# Patient Record
Sex: Female | Born: 1995 | Race: White | Hispanic: No | Marital: Single | Smoking: Former smoker
Health system: Southern US, Community
[De-identification: ages and names within clinical notes are randomized; demographics above are authoritative.]

## PROBLEM LIST (undated history)

## (undated) DIAGNOSIS — E1143 Type 2 diabetes mellitus with diabetic autonomic (poly)neuropathy: Secondary | ICD-10-CM

## (undated) DIAGNOSIS — K219 Gastro-esophageal reflux disease without esophagitis: Secondary | ICD-10-CM

## (undated) DIAGNOSIS — F419 Anxiety disorder, unspecified: Secondary | ICD-10-CM

## (undated) DIAGNOSIS — A1801 Tuberculosis of spine: Secondary | ICD-10-CM

## (undated) DIAGNOSIS — G90A Postural orthostatic tachycardia syndrome (POTS): Secondary | ICD-10-CM

## (undated) DIAGNOSIS — E109 Type 1 diabetes mellitus without complications: Secondary | ICD-10-CM

## (undated) DIAGNOSIS — K3184 Gastroparesis: Secondary | ICD-10-CM

## (undated) DIAGNOSIS — F32A Depression, unspecified: Secondary | ICD-10-CM

## (undated) DIAGNOSIS — F329 Major depressive disorder, single episode, unspecified: Secondary | ICD-10-CM

## (undated) HISTORY — DX: Gastroparesis: K31.84

## (undated) HISTORY — DX: Type 1 diabetes mellitus without complications: E10.9

## (undated) HISTORY — DX: Depression, unspecified: F32.A

## (undated) HISTORY — DX: Postural orthostatic tachycardia syndrome (POTS): G90.A

## (undated) HISTORY — DX: Gastroparesis: E11.43

## (undated) HISTORY — PX: NO PAST SURGERIES: SHX2092

## (undated) HISTORY — DX: Major depressive disorder, single episode, unspecified: F32.9

## (undated) HISTORY — DX: Anxiety disorder, unspecified: F41.9

---
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
## 2016-01-28 DIAGNOSIS — E1065 Type 1 diabetes mellitus with hyperglycemia: Secondary | ICD-10-CM | POA: Insufficient documentation

## 2018-02-27 DIAGNOSIS — E1065 Type 1 diabetes mellitus with hyperglycemia: Secondary | ICD-10-CM | POA: Diagnosis not present

## 2018-02-27 DIAGNOSIS — R112 Nausea with vomiting, unspecified: Secondary | ICD-10-CM | POA: Diagnosis not present

## 2018-04-28 ENCOUNTER — Encounter: Payer: Self-pay | Admitting: Physician Assistant

## 2018-04-28 ENCOUNTER — Ambulatory Visit (INDEPENDENT_AMBULATORY_CARE_PROVIDER_SITE_OTHER): Payer: 59 | Admitting: Physician Assistant

## 2018-04-28 VITALS — BP 114/80 | HR 75 | Ht 66.0 in | Wt 149.0 lb

## 2018-04-28 DIAGNOSIS — K3184 Gastroparesis: Secondary | ICD-10-CM

## 2018-04-28 DIAGNOSIS — E1143 Type 2 diabetes mellitus with diabetic autonomic (poly)neuropathy: Secondary | ICD-10-CM | POA: Insufficient documentation

## 2018-04-28 DIAGNOSIS — F17210 Nicotine dependence, cigarettes, uncomplicated: Secondary | ICD-10-CM | POA: Diagnosis not present

## 2018-04-28 DIAGNOSIS — E1065 Type 1 diabetes mellitus with hyperglycemia: Secondary | ICD-10-CM

## 2018-04-28 DIAGNOSIS — Z7689 Persons encountering health services in other specified circumstances: Secondary | ICD-10-CM

## 2018-04-28 DIAGNOSIS — F39 Unspecified mood [affective] disorder: Secondary | ICD-10-CM

## 2018-04-28 MED ORDER — FLUOXETINE HCL 10 MG PO TABS: 10.0000 mg | ORAL_TABLET | Freq: Every day | ORAL | 0 refills | Status: DC

## 2018-04-28 MED ORDER — FREESTYLE LIBRE SENSOR SYSTEM MISC: 0 refills | Status: DC

## 2018-04-28 MED ORDER — DULOXETINE HCL 20 MG PO CPEP: ORAL_CAPSULE | ORAL | 1 refills | Status: DC

## 2018-04-28 MED ORDER — GLUCOSE BLOOD VI STRP: ORAL_STRIP | 3 refills | Status: DC

## 2018-04-28 MED ORDER — INSULIN GLARGINE 100 UNIT/ML ~~LOC~~ SOLN: 40.0000 [IU] | Freq: Every day | SUBCUTANEOUS | 2 refills | Status: DC

## 2018-04-28 MED ORDER — INSULIN GLARGINE 100 UNIT/ML SOLOSTAR PEN: PEN_INJECTOR | SUBCUTANEOUS | 1 refills | Status: DC

## 2018-04-28 MED ORDER — FREESTYLE LIBRE SENSOR SYSTEM MISC
0 refills | Status: DC
Start: 2018-04-28 — End: 2018-05-19

## 2018-04-28 NOTE — Progress Notes (Signed)
HPI:                                                                Veronica Liu is a 22 y.o. female who presents to [facility]: Primary Care Sports Medicine today to establish care  Current concerns: medication refills  Depression/Anxiety: has been taking Fluoxetine 20 mg daily for approximately 3 months. Does not feel it is helping her mood or anxiety. Endorses mood swings, anhedonia, depressed mood, excessive guilt, sleep difficulties, excessive worry, difficulty relaxing most days. Endorses passive SI, no plan or self-harming behaviors. Hx of self-harming 4 years ago. Also reports some hypomanic behaviors and psychiatry is questioning a possible bipolar II diagnosis. Denies suicidal thinking. Denies auditory/visual hallucinations. No history of psychiatric hospitalizations No drug or alcohol misuse  Type 1 Diabetes: last A1C 9.4, 2 months ago. Currently on Lantus 40 units daily and Humalog sliding scale. Reports history of gastroparesis, currently controlled.   Depression screen PHQ 2/9 04/28/2018  Decreased Interest 2  Down, Depressed, Hopeless 2  PHQ - 2 Score 4  Altered sleeping 3  Tired, decreased energy 3  Change in appetite 3  Feeling bad or failure about yourself  3  Trouble concentrating 2  Moving slowly or fidgety/restless 1  Suicidal thoughts 1  PHQ-9 Score 20    GAD 7 : Generalized Anxiety Score 04/28/2018  Nervous, Anxious, on Edge 2  Control/stop worrying 2  Worry too much - different things 3  Trouble relaxing 1  Restless 2  Easily annoyed or irritable 1  Afraid - awful might happen 1  Total GAD 7 Score 12  Anxiety Difficulty Very difficult      Past Medical History:  Diagnosis Date  . Anxiety   . Depression   . Diabetes mellitus type I (HCC)   . Diabetic gastroparesis (HCC)   . GERD (gastroesophageal reflux disease)    Past Surgical History:  Procedure Laterality Date  . NO PAST SURGERIES     Social History   Tobacco  Use  . Smoking status: Current Every Day Smoker    Packs/day: 0.50    Years: 5.00    Pack years: 2.50    Types: Cigarettes  . Smokeless tobacco: Never Used  . Tobacco comment: NA  Substance Use Topics  . Alcohol use: Never    Frequency: Never   family history includes Cancer in her mother; Diabetes in her cousin, father, and paternal grandmother; Hypothyroidism in her mother and paternal grandmother.    ROS: Review of Systems  Constitutional: Positive for diaphoresis and malaise/fatigue.  Gastrointestinal: Positive for constipation and nausea.  Musculoskeletal: Positive for myalgias.  Neurological: Positive for headaches.  Psychiatric/Behavioral: Positive for depression. The patient is nervous/anxious and has insomnia.      Medications: Current Outpatient Medications  Medication Sig Dispense Refill  . acetone, urine, test strip Check ketones per protocol 50 each 3  . BAYER MICROLET LANCETS lancets Check sugars 6 times daily and per protocol for hyper and hypoglycemia 250 each 3  . glucagon (GLUCAGON EMERGENCY) 1 MG injection Inject 1 mg into the muscle once as needed (for hypoglycemia). 2 each 3  . glucose blood (ONETOUCH VERIO) test strip Checks blood sugar 6x daily 600 each 3  . Insulin Glargine (LANTUS  SOLOSTAR) 100 UNIT/ML Solostar Pen Use up to 70 units daily 10 pen 1  . Insulin Pen Needle (INSUPEN PEN NEEDLES) 32G X 4 MM MISC Inject insulin via insulin pen 6 x daily 600 each 4  . promethazine (PHENERGAN) 25 MG tablet Take 25 mg by mouth as needed for nausea or vomiting.    . ranitidine (ZANTAC) 150 MG tablet Take 1 tablet (150 mg total) by mouth 2 (two) times daily as needed for heartburn (for acid reflux). 180 tablet 3  . Continuous Blood Gluc Sensor (FREESTYLE LIBRE SENSOR SYSTEM) MISC Check morning FBG and before meals 1 each 0  . DULoxetine (CYMBALTA) 20 MG capsule Take 1 capsule (20 mg total) by mouth at bedtime for 7 days, THEN 2 capsules (40 mg total) at bedtime for  23 days. 30 capsule 1  . FLUoxetine (PROZAC) 10 MG tablet Take 1 tablet (10 mg total) by mouth daily. 10 tablet 0   No current facility-administered medications for this visit.    Allergies  Allergen Reactions  . Levemir [Insulin Detemir]     Hives at injection sites  . Augmentin [Amoxicillin-Pot Clavulanate]     Vomiting as young child  . Omnicef [Cefdinir]     Vomiting as young child.       Objective:  BP 114/80   Pulse 75   Ht  (1.676 m)   Wt 149 lb (67.6 kg)   LMP 04/19/2018 (Exact Date)   SpO2 100%   BMI 24.05 kg/m  Gen:  alert, not ill-appearing, no distress, appropriate for age HEENT: head normocephalic without obvious abnormality, conjunctiva and cornea clear, wearing glasses, trachea midline Pulm: Normal work of breathing, normal phonation, clear to auscultation bilaterally, no wheezes, rales or rhonchi CV: Normal rate, regular rhythm, s1 and s2 distinct, mid-systolic click, no murmurs Neuro: alert and oriented x 3, no tremor MSK: extremities atraumatic, normal gait and station Skin: intact, no rashes on exposed skin, no jaundice, no cyanosis Psych: well-groomed, cooperative, good eye contact, depressed mood, affect full range, speech is articulate, and thought processes clear and goal-directed    No results found for this or any previous visit (from the past 72 hour(s)). No results found.    Assessment and Plan: 22 y.o. female with   Encounter to establish care  Uncontrolled type 1 diabetes mellitus with hyperglycemia (HCC) - Plan: Ambulatory referral to Endocrinology, Continuous Blood Gluc Sensor (FREESTYLE LIBRE SENSOR SYSTEM) MISC, Insulin Glargine (LANTUS SOLOSTAR) 100 UNIT/ML Solostar Pen, glucose blood (ONETOUCH VERIO) test strip, DISCONTINUED: insulin glargine (LANTUS) 100 UNIT/ML injection, DISCONTINUED: glucose blood (ONETOUCH VERIO) test strip, DISCONTINUED: Continuous Blood Gluc Sensor (FREESTYLE LIBRE SENSOR SYSTEM) MISC  Type I diabetes  mellitus, uncontrolled (HCC)  Mood disorder (HCC) - Plan: DULoxetine (CYMBALTA) 20 MG capsule, FLUoxetine (PROZAC) 10 MG tablet, DISCONTINUED: DULoxetine (CYMBALTA) 20 MG capsule, DISCONTINUED: FLUoxetine (PROZAC) 10 MG tablet  Cigarette nicotine dependence without complication  - Personally reviewed PMH, PSH, PFH, medications, allergies, HM - Age-appropriate cancer screening: Pap smear beginning age 72 - Influenza n/a - Tdap overdue  Type 1 diabetes - last A1C 9.2 - explained I do not manage type 1 diabetes and will refer her to endocrinology for management - refills provided until she can establish  Mood disorder - PHQ9=20, no acute safety issues; GAD7=12 - no active mania - not responding to Fluoxetine after 3 month trial - tapering off of Fluoxetine and titrating up on Duloxetine - referring to behavioral health for CBT    Patient education  and anticipatory guidance given Patient agrees with treatment plan Follow-up in 4 weeks for mood or sooner as needed if symptoms worsen or fail to improve  Levonne Hubert PA-C

## 2018-05-19 ENCOUNTER — Ambulatory Visit (INDEPENDENT_AMBULATORY_CARE_PROVIDER_SITE_OTHER): Payer: 59 | Admitting: Internal Medicine

## 2018-05-19 ENCOUNTER — Encounter: Payer: Self-pay | Admitting: Internal Medicine

## 2018-05-19 VITALS — BP 118/80 | HR 87 | Ht 66.0 in | Wt 151.0 lb

## 2018-05-19 DIAGNOSIS — E1042 Type 1 diabetes mellitus with diabetic polyneuropathy: Secondary | ICD-10-CM | POA: Diagnosis not present

## 2018-05-19 DIAGNOSIS — E1065 Type 1 diabetes mellitus with hyperglycemia: Secondary | ICD-10-CM | POA: Diagnosis not present

## 2018-05-19 DIAGNOSIS — IMO0002 Reserved for concepts with insufficient information to code with codable children: Secondary | ICD-10-CM

## 2018-05-19 LAB — POCT GLYCOSYLATED HEMOGLOBIN (HGB A1C): Hemoglobin A1C: 9.2 % — AB (ref 4.0–5.6)

## 2018-05-19 MED ORDER — INSULIN DEGLUDEC 200 UNIT/ML ~~LOC~~ SOPN: 36.0000 [IU] | PEN_INJECTOR | Freq: Every day | SUBCUTANEOUS | 3 refills | Status: DC

## 2018-05-19 MED ORDER — INSULIN GLARGINE 100 UNIT/ML SOLOSTAR PEN: PEN_INJECTOR | SUBCUTANEOUS | 1 refills | Status: DC

## 2018-05-19 NOTE — Addendum Note (Signed)
Addended by: Yolande JollyLAWSON, Yehudis Monceaux on: 05/19/2018 01:07 PM   Modules accepted: Orders

## 2018-05-19 NOTE — Patient Instructions (Addendum)
Please decrease: - Long acting insulin to 36 units and switch to [place]  Please change - Humalog: ICR 1:6 >> 5 Target 100 ISF 25  Please try to eat breakfast.  Look into Dexcom 6 CGM.  Please return in 3 months with your sugar log.   Basic Rules for Patients with Type I Diabetes Mellitus  1. The American Diabetes Association (ADA) recommended targets: - fasting sugar <130 - after meal sugar <180 - HbA1C <7%  2. Engage in ?150 min moderate exercise per week  3. Make sure you have ?8h of sleep every night as this helps both blood sugars and your weight.  4. Always keep a sugar log (not only record in your meter) and bring it to all appointments with [place].  5. If you are on a pump, know how to access the settings and to modify the parameters.  6.  Remember, you can always call the number on the back of the pump for emergencies related to the pump.  7. "15-15 rule" for hypoglycemia: if sugars are low, take 15 g of carbs** ("fast sugar" - e.g. 4 glucose tablets, 4 oz orange juice), wait 15 min, then check sugars again. If still <80, repeat. Continue  until your sugars >80, then eat a normal meal.   8. Teach family members and coworkers to inject glucagon. Have a glucagon set at home and one at work. They should call 911 after using the set.  9. If you are on a pump, set "insulin on board" time for 5 hours (if your sugars tend to be higher, can use 4 hours).   10. If you are on a pump, use the "dual wave bolus" setting for high fat foods (e.g. pizza). Start with a setting of 50%-50% (50% instant bolus and 50% prolonged bolus over 3h, for e.g.).    11. If you are on a pump, make sure the basal daily insulin dose is approximately equal (not larger) to the daily insulin you get from boluses, otherwise you are at risk for hypoglycemia.  12. Check sugar before driving. If <100, correct, and only start driving if sugars rise ?161. Check sugar every hour when on a long drive.  13. Check  sugar before exercising. If <100, correct, and only start exercising if sugars rise ?100. Check sugar every hour when on a long exercise routine and 1h after you finished exercising.   If >250, check urine for ketones. If you have moderate-large ketones in urine, do not start exercise. Hydrate yourself with clear liquids and correct the high sugar. Recheck sugars and ketones before attempting to exercise.  Be aware that you might need less insulin when exercising.  *intense, short, exercise bursts can increase your sugars, but  *less intense, longer (>1h), exercise routines can decrease your sugars.  If you are on a pump, you might need to decrease your basal rate by 10% or more (or even disconnect your pump) while you exercise to prevent low sugars. Do not disconnect your pump by more than 3 hours at a time! You also might need to decrease your insulin bolus for the meal prior to your exercise time by 20% or more.  14. Make sure you have a MedAlert bracelet or pendant mentioning "Type I Diabetes Mellitus". If you have a prior episode of severe hypoglycemia or hypoglycemia unawareness, it should also mention this.  15. Please do not walk barefoot. Inspect your feet for sores/cuts and let us know if you have them.  16.  Please call [facility] with any questions and concerns [phone]).   **E.g. of "fast carbs": ? first choice (15 g):  1 tube glucose gel, GlucoPouch 15, 2 oz glucose liquid ? second choice (15-16 g):  3 or 4 glucose tablets (best taken  with water), 15 Dextrose Bits chewable ? third choice (15-20 g):   cup fruit juice,  cup regular soda, 1 cup skim milk,  1 cup sports drink ? fourth choice (15-20 g):  1 small tube [place] gel (not frosting), 2 tbsp raisins, 1 tbsp table sugar,  candy, jelly beans, gum drops - check package for carb amount   (adapted from: Juluis RainierMcCall A.L. "Insulin therapy and hypoglycemia" Endocrinol Metab Clin N Am 2012, 41:  57-87)

## 2018-05-19 NOTE — Progress Notes (Signed)
Patient ID: Veronica Liu, female   DOB: 11-Aug-1996, 22 y.o.   MRN: [id number]  HPI: Veronica Liu is a 22 y.o.-year-old female, referred by her PCP, Trixie Dredge, PA-C, for management of DM1, uncontrolled, with long-term complications (PN, gastoparesis).  She was previously followed by Dr. Tobe Sos and then Annia Belt with pediatric endocrinology at [facility].  She is here with her mother who is a Marine scientist and who offers information about patient's past medical history, diabetes management, and diet.  Patient has been diagnosed with DM2 at 22 y/o, then DM1 at 22 y/o.  She was started on insulin after being diagnosed with DM1. She has been advised  against an insulin pump in the past until she can get better control of her diabetes she noticed that in the last 1.5 years her insulin resistance has decreased.  She controls her diabetes a little better.  She tried to obtain the freestyle libre CGM but this was too expensive.  Last hemoglobin A1c was: 02/27/2018: HbA1c 9.4% 11/11/2017: HbA1c 8.8% 08/16/2017: HbA1c 8.9% 04/18/2017: HbA1c 9.3% 06/23/2016: HbA1c 12% Lab Results  Component Value Date   HGBA1C 8.7 08/05/2014   HGBA1C 9.9 03/05/2014   HGBA1C 8.0 09/06/2013   She is on the following regimen: -Lantus 40 units at bedtime - does not miss doses -Humalog: ICR 1:6 Target 100 ISF 25  Meter: One Touch Verio  Pt checks her sugars 4x a day and they are: - am: 120-140, 200 - 2h after b'fast: n/c - before lunch: 170 (8 units) - 2h after lunch: n/c - before dinner: 200-250 (10-12 units) - 2h after dinner: n/c - bedtime: 300s - nighttime: 42 + lows. Lowest sugar was 42 last week (during the night); she has hypoglycemia awareness at 110. No previous hypoglycemia admission. She does have a glucagon kit at home. Highest sugar was 475-HI. + previous DKA admissions - last 3 years ago.    Pt's meals are (she is very sensitive to discussions about her diet): - Breakfast:  skips b/c gastroparesis (nausea) - prev. Reglan, now phenergan - Lunch: sandwich + chips - Dinner: meat + veggies + bread - Snacks: cheese sticks, carrots  She exercises by cardio 3 times a week.  Reviewed previous labs - no CKD, + HL:  Previously: Lab Results  Component Value Date   BUN 14 03/05/2014   BUN 8 06/06/2013   CREATININE 0.55 03/05/2014   CREATININE 0.43 (L) 06/06/2013   Lab Results  Component Value Date   CHOL 203 (H) 03/05/2014   HDL 47 03/05/2014   LDLCALC 133 (H) 03/05/2014   TRIG 116 03/05/2014   CHOLHDL 4.3 03/05/2014   - last eye exam was in 2018. No DR.  - + occas. numbness and tingling in her feet.  Last TSH was normal: Lab Results  Component Value Date   TSH 1.053 03/05/2014   Pt has FH of DM2 in father and PGM.  Cousin with DM1.  She has a history of depression. She smokes half a pack a day.   As of now, she  finished school and not yet working.  ROS: Constitutional: no weight gain/loss, + fatigue, no subjective hyperthermia/hypothermia, + poor sleep Eyes: no blurry vision, no xerophthalmia ENT: no sore throat, no nodules palpated in throat, no dysphagia/odynophagia, no hoarseness Cardiovascular: no CP/SOB/palpitations/leg swelling Respiratory: no cough/SOB Gastrointestinal: + N/no V/D/C Musculoskeletal: no muscle/joint aches Skin: no rashes Neurological: no tremors/numbness/tingling/dizziness Psychiatric: + Both depression/anxiety  Past Medical History:  Diagnosis Date  .  Anxiety   . Depression   . Diabetes mellitus type I ([place])   . Diabetic gastroparesis ([place])   . GERD (gastroesophageal reflux disease)    Past Surgical History:  Procedure Laterality Date  . NO PAST SURGERIES     Social History   Socioeconomic History  . Marital status: Single    Spouse name: Not on file  . Number of children: 0  Occupational History  . N/a  Tobacco Use  . Smoking status: Current Every Day Smoker    Packs/day: 0.50    Years: 5.00     Pack years: 2.50    Types: Cigarettes  . Smokeless tobacco: Never Used  . Tobacco comment: NA  Substance and Sexual Activity  . Alcohol use: Never    Frequency: Never  . Drug use: Never  . Sexual activity: Yes    Birth control/protection: None, Condom  Social History Narrative   Lives with mom, step-dad, brother, and sister. Sees bio-dad twice monthly.    Current Outpatient Medications on File Prior to Visit  Medication Sig Dispense Refill  . acetone, urine, test strip Check ketones per protocol 50 each 3  . BAYER MICROLET LANCETS lancets Check sugars 6 times daily and per protocol for hyper and hypoglycemia 250 each 3  . Continuous Blood Gluc Sensor (FREESTYLE LIBRE SENSOR SYSTEM) MISC Check morning FBG and before meals 1 each 0  . DULoxetine (CYMBALTA) 20 MG capsule Take 1 capsule (20 mg total) by mouth at bedtime for 7 days, THEN 2 capsules (40 mg total) at bedtime for 23 days. 30 capsule 1  . FLUoxetine (PROZAC) 10 MG tablet Take 1 tablet (10 mg total) by mouth daily. 10 tablet 0  . glucagon (GLUCAGON EMERGENCY) 1 MG injection Inject 1 mg into the muscle once as needed (for hypoglycemia). 2 each 3  . glucose blood (ONETOUCH VERIO) test strip Checks blood sugar 6x daily 600 each 3  . Insulin Glargine (LANTUS SOLOSTAR) 100 UNIT/ML Solostar Pen Use up to 70 units daily 10 pen 1  . Insulin Lispro Prot & Lispro (HUMALOG MIX 75/25 PEN [place]) Inject into the skin. QID, sliding scale    . Insulin Pen Needle (INSUPEN PEN NEEDLES) 32G X 4 MM MISC Inject insulin via insulin pen 6 x daily 600 each 4  . promethazine (PHENERGAN) 25 MG tablet Take 25 mg by mouth as needed for nausea or vomiting.    . ranitidine (ZANTAC) 150 MG tablet Take 1 tablet (150 mg total) by mouth 2 (two) times daily as needed for heartburn (for acid reflux). 180 tablet 3   No current facility-administered medications on file prior to visit.    Allergies  Allergen Reactions  . Levemir [Insulin Detemir]     Hives at  injection sites  . Augmentin [Amoxicillin-Pot Clavulanate]     Vomiting as young child  . Omnicef [Cefdinir]     Vomiting as young child.   Family History  Problem Relation Age of Onset  . Hypothyroidism Mother   . Cancer Mother        appendix cancer  . Diabetes Father        type 2  . Hypothyroidism Paternal Grandmother   . Diabetes Paternal Grandmother        type 2  . Diabetes Cousin        type 1    PE: BP 118/80   Pulse 87   Ht _0  (1.676 m)   Wt 151 lb (68.5 kg)  LMP 04/19/2018 (Exact Date)   SpO2 98%   BMI 24.37 kg/m  Wt Readings from Last 3 Encounters:  05/19/18 151 lb (68.5 kg)  04/28/18 149 lb (67.6 kg)  08/05/14 169 lb (76.7 kg) (94 %, Z= 1.53)*   * Growth percentiles are based on CDC (Girls, 2-20 Years) data.   Constitutional: Normal weight, in NAD Eyes: PERRLA, EOMI, no exophthalmos ENT: moist mucous membranes, no thyromegaly, no cervical lymphadenopathy Cardiovascular: RRR, No MRG Respiratory: CTA B Gastrointestinal: abdomen soft, NT, ND, BS+ Musculoskeletal: no deformities, strength intact in all 4 Skin: moist, warm, no rashes Neurological: no tremor with outstretched hands, DTR normal in all 4  ASSESSMENT: 1. DM1, uncontrolled, without long-term complications, but with hyperglycemia  PLAN:  1. Patient with long-standing, uncontrolled DM1, on basal-bolus insulin regimen.  She noticed an improvement in her diabetes control in the last 1.5 years, and she also lost a significant amount of weight since then.  Her sugars are still not very well controlled, today, HbA1c being 9.2%.  This is slightly better compared to before, but above goal.   - Her sugars increase in a stepwise fashion throughout the day, usually a sign of not enough mealtime insulin.  At night, before going to bed, her sugars are in the 300s and they decrease by more than 50% from night to morning.  We discussed that this is an excessive drop and it is not physiologic.  I would  suggest to decrease the dose of her basal insulin and increase the dose of her rapid acting insulin for a more physiologic regimen of approximately 50-50% of insulin from basal and from boluses per day. - I also suggested to switch her basal insulin from Lantus to [place], which is longer acting and less conducive to low blood sugars and fluctuating CBGs.  We will also try to use [place] U200 which allow for smaller injection volumes, conducive to less scar tissue and better absorption. - I also suggested to decrease her insulin to carb ratio from 1:6 to 1:5 and, occasionally, to introduce 50% of her protein grams as carbs for better coverage of her meals - She will also try to start eating breakfast, at least half a glass of protein shake.  We discussed how to cover this. - we also discussed about an insulin pump and I advised her to stay open to this possibility. - As she is interested in a, CGM, I suggested a Dexcom 6 CGM rather than the Freestyle libre CGM, since the first has alarms and, if she decides for an insulin pump, this can be connected to a T:slim pump.  Given Dexcom G6 brochure and advised her how to get in touch with the rep. - We discussed about changes to his insulin regimen, as follows:  Patient Instructions  Please decrease: - Long acting insulin to 36 units and switch to [place]  Please change - Humalog: ICR 1:6 >> 5 Target 100 ISF 25   Please try to eat breakfast.  Look into Dexcom 6 CGM.  Please return in 3 months with your sugar log.   - Continuechecking sugars at different times of the day - check at least 4 times a day, rotating checks - given sugar log and advised how to fill it and to bring it at next appt  - given foot care handout and explained the principles  - given instructions for hypoglycemia management "15-15 rule"  - advised for yearly eye exams - sent glucagon kit Rx to pharmacy -  advised to get ketone strips - advised to always have Glu tablets with  her - advised for a Med-alert bracelet mentioning "type 1 diabetes mellitus". - given instruction Re: exercising and driving in DM1 (pt instructions) - no signs of other autoimmune disorders, will need a TSH at next visit - Return to clinic in 3 mo with sugar log   Philemon Kingdom, MD PhD [facility]

## 2018-05-30 ENCOUNTER — Ambulatory Visit: Payer: 59 | Admitting: Physician Assistant

## 2018-06-19 ENCOUNTER — Other Ambulatory Visit: Payer: Self-pay

## 2018-06-19 MED ORDER — DEXCOM G6 TRANSMITTER MISC: 1.0000 | Freq: Once | 3 refills | Status: DC

## 2018-06-19 MED ORDER — DEXCOM G6 RECEIVER DEVI: 1.0000 | Freq: Once | 0 refills | Status: DC

## 2018-06-19 MED ORDER — DEXCOM G6 SENSOR MISC: 1.0000 | Freq: Once | 12 refills | Status: DC

## 2018-06-28 ENCOUNTER — Other Ambulatory Visit: Payer: Self-pay | Admitting: Physician Assistant

## 2018-06-28 DIAGNOSIS — F39 Unspecified mood [affective] disorder: Secondary | ICD-10-CM

## 2018-07-18 DIAGNOSIS — Z794 Long term (current) use of insulin: Secondary | ICD-10-CM | POA: Diagnosis not present

## 2018-07-18 DIAGNOSIS — E1042 Type 1 diabetes mellitus with diabetic polyneuropathy: Secondary | ICD-10-CM | POA: Diagnosis not present

## 2018-07-29 ENCOUNTER — Other Ambulatory Visit: Payer: Self-pay | Admitting: Physician Assistant

## 2018-07-29 DIAGNOSIS — F39 Unspecified mood [affective] disorder: Secondary | ICD-10-CM

## 2018-09-15 ENCOUNTER — Ambulatory Visit: Payer: 59 | Admitting: Internal Medicine

## 2018-09-15 DIAGNOSIS — Z0289 Encounter for other administrative examinations: Secondary | ICD-10-CM

## 2018-09-15 NOTE — Progress Notes (Deleted)
Patient ID: Veronica Liu, female   DOB: [birth year], 22 y.o.   MRN: [id number]  HPI: Veronica Liu is a 22 y.o.-year-old femalele, referred by her PCP, Trixie Dredge, PA-C, for management of DM, dx'ed at 22 y/o but as DM1 at 22 y/o, uncontrolled, with long-term complications (PN, gastoparesis).  She was previously followed by Dr. Tobe Sos and then Annia Belt with pediatric endocrinology at [facility].    She has been advised  against an insulin pump in the past until she can get better control of her diabetes.  Since then, she feels that she is doing better with her diabetes control.  She did try to obtain a freestyle libre CGM but this was too expensive.  At last visit, I suggested a Dexcom G6 and I advised her to discuss with her insurance to see if this is covered.  Last hemoglobin A1c was: Lab Results  Component Value Date   HGBA1C 9.2 (A) 05/19/2018   HGBA1C 8.7 08/05/2014   HGBA1C 9.9 03/05/2014  02/27/2018: HbA1c 9.4% 11/11/2017: HbA1c 8.8% 08/16/2017: HbA1c 8.9% 04/18/2017: HbA1c 9.3% 06/23/2016: HbA1c 12%  She is on: -Lantus 40 units at bedtime >> Tresiba 36 units daily, changed 05/2018 -Humalog: ICR 1:6 >> 1.5 Target 100 ISF 25  Meter: One Touch Verio  Pt checks her sugars 4x a day: - am: 120-140, 200 - 2h after b'fast: n/c - before lunch: 170 (8 units) - 2h after lunch: n/c - before dinner: 200-250 (10-12 units) - 2h after dinner: n/c - bedtime: 300s - nighttime: 42 Lowest sugar was 42 (during the night) >> ***; she has hypoglycemia awareness at 110. No previous hypoglycemia admission. She ha a Glucagon kit at home. Highest sugar was 475-HI. + previous DKA admissions - last 3 years ago.    Pt's meals are (she is very sensitive to discussions about her diet): - Breakfast: skips b/c gastroparesis (nausea) - prev. Reglan, now phenergan - Lunch: sandwich + chips - Dinner: meat + veggies + bread - Snacks: cheese sticks, carrots  She exercises (cardio)  3x a week.  Reviewed prev. Labs: No CKD, + HL:  Previously: Lab Results  Component Value Date   BUN 14 03/05/2014   BUN 8 06/06/2013   CREATININE 0.55 03/05/2014   CREATININE 0.43 (L) 06/06/2013   Lab Results  Component Value Date   CHOL 203 (H) 03/05/2014   HDL 47 03/05/2014   LDLCALC 133 (H) 03/05/2014   TRIG 116 03/05/2014   CHOLHDL 4.3 03/05/2014   - last eye exam was in 2018: No DR -  + occas. numbness and tingling in her feet.  Last TSH normal: Lab Results  Component Value Date   TSH 1.053 03/05/2014   Pt has FH of DM2 in father and PGM.  Cousin with DM1.  She has a history of depression. She smokes half a pack a day.   As of now, she  finished school and not yet working.  ROS: Constitutional: no weight gain/no weight loss, no fatigue, no subjective hyperthermia, no subjective hypothermia Eyes: no blurry vision, no xerophthalmia ENT: no sore throat, no nodules palpated in throat, no dysphagia, no odynophagia, no hoarseness Cardiovascular: no CP/no SOB/no palpitations/no leg swelling Respiratory: no cough/no SOB/no wheezing Gastrointestinal: + N/no V/no D/no C/no acid reflux Musculoskeletal: no muscle aches/no joint aches Skin: no rashes, no hair loss Neurological: no tremors/no numbness/no tingling/no dizziness  I reviewed pt's medications, allergies, PMH, social hx, family hx, and changes were documented in the history  of present illness. Otherwise, unchanged from my initial visit note.  Past Medical History:  Diagnosis Date  . Anxiety   . Depression   . Diabetes mellitus type I ([place])   . Diabetic gastroparesis ([place])   . GERD (gastroesophageal reflux disease)    Past Surgical History:  Procedure Laterality Date  . NO PAST SURGERIES     Social History   Socioeconomic History  . Marital status: Single    Spouse name: Not on file  . Number of children: 0  Occupational History  . N/a  Tobacco Use  . Smoking status: Current Every Day Smoker     Packs/day: 0.50    Years: 5.00    Pack years: 2.50    Types: Cigarettes  . Smokeless tobacco: Never Used  . Tobacco comment: NA  Substance and Sexual Activity  . Alcohol use: Never    Frequency: Never  . Drug use: Never  . Sexual activity: Yes    Birth control/protection: None, Condom  Social History Narrative   Lives with mom, step-dad, brother, and sister. Sees bio-dad twice monthly.    Current Outpatient Medications on File Prior to Visit  Medication Sig Dispense Refill  . acetone, urine, test strip Check ketones per protocol 50 each 3  . BAYER MICROLET LANCETS lancets Check sugars 6 times daily and per protocol for hyper and hypoglycemia 250 each 3  . DULoxetine (CYMBALTA) 20 MG capsule Take 2 capsules (40 mg total) by mouth daily. Due for follow up visit 60 capsule 0  . FLUoxetine (PROZAC) 10 MG tablet Take 1 tablet (10 mg total) by mouth daily. 10 tablet 0  . glucagon (GLUCAGON EMERGENCY) 1 MG injection Inject 1 mg into the muscle once as needed (for hypoglycemia). 2 each 3  . glucose blood (ONETOUCH VERIO) test strip Checks blood sugar 6x daily 600 each 3  . Insulin Degludec (TRESIBA FLEXTOUCH) 200 UNIT/ML SOPN Inject 36 Units into the skin daily. 3 pen 3  . Insulin Glargine (LANTUS SOLOSTAR) 100 UNIT/ML Solostar Pen Use up to 40 units daily 10 pen 1  . Insulin Lispro Prot & Lispro (HUMALOG MIX 75/25 PEN ) Inject into the skin. QID, sliding scale    . Insulin Pen Needle (INSUPEN PEN NEEDLES) 32G X 4 MM MISC Inject insulin via insulin pen 6 x daily 600 each 4  . promethazine (PHENERGAN) 25 MG tablet Take 25 mg by mouth as needed for nausea or vomiting.    . ranitidine (ZANTAC) 150 MG tablet Take 1 tablet (150 mg total) by mouth 2 (two) times daily as needed for heartburn (for acid reflux). 180 tablet 3   No current facility-administered medications on file prior to visit.    Allergies  Allergen Reactions  . Levemir [Insulin Detemir]     Hives at injection sites  .  Augmentin [Amoxicillin-Pot Clavulanate]     Vomiting as young child  . Omnicef [Cefdinir]     Vomiting as young child.   Family History  Problem Relation Age of Onset  . Hypothyroidism Mother   . Cancer Mother        appendix cancer  . Diabetes Father        type 2  . Hypothyroidism Paternal Grandmother   . Diabetes Paternal Grandmother        type 2  . Diabetes Cousin        type 1   PE: There were no vitals taken for this visit. Wt Readings from Last 3 Encounters:  05/19/18 151 lb (68.5 kg)  04/28/18 149 lb (67.6 kg)  08/05/14 169 lb (76.7 kg) (94 %, Z= 1.53)*   * Growth percentiles are based on CDC (Girls, 2-20 Years) data.   Constitutional: normal weight, in NAD Eyes: PERRLA, EOMI, no exophthalmos ENT: moist mucous membranes, no thyromegaly, no cervical lymphadenopathy Cardiovascular: RRR, No MRG Respiratory: CTA B Gastrointestinal: abdomen soft, NT, ND, BS+ Musculoskeletal: no deformities, strength intact in all 4 Skin: moist, warm, no rashes Neurological: no tremor with outstretched hands, DTR normal in all 4  ASSESSMENT: 1. DM1, uncontrolled, without long-term complications, but with hyperglycemia  PLAN:  1. Patient with   long-standing, uncontrolled DM1, on basal-bolus insulin regimen.  She noticed an improvement in her diabetes control in the last 1.5 years, and she also lost a significant amount of weight since then.  Her sugars are still not very well controlled, today, HbA1c being 9.2%.  This is slightly better compared to before, but above goal.   - Her sugars increase in a stepwise fashion throughout the day, usually a sign of not enough mealtime insulin.  At night, before going to bed, her sugars are in the 300s and they decrease by more than 50% from night to morning.  We discussed that this is an excessive drop and it is not physiologic.  I would suggest to decrease the dose of her basal insulin and increase the dose of her rapid acting insulin for a more  physiologic regimen of approximately 50-50% of insulin from basal and from boluses per day. - I also suggested to switch her basal insulin from Lantus to [place], which is longer acting and less conducive to low blood sugars and fluctuating CBGs.  We will also try to use [place] U200 which allow for smaller injection volumes, conducive to less scar tissue and better absorption. - I also suggested to decrease her insulin to carb ratio from 1:6 to 1:5 and, occasionally, to introduce 50% of her protein grams as carbs for better coverage of her meals - She will also try to start eating breakfast, at least half a glass of protein shake.  We discussed how to cover this. - we also discussed about an insulin pump and I advised her to stay open to this possibility. - As she is interested in a, CGM, I suggested a Dexcom 6 CGM rather than the Freestyle libre CGM, since the first has alarms and, if she decides for an insulin pump, this can be connected to a T:slim pump.  Given Dexcom G6 brochure and advised her how to get in touch with the rep.  - We discussed about changes to his insulin regimen, as follows:  Patient Instructions  Please continue: - Tresiba 36 units daily - Humalog:  ICR 1:5 Target 100 ISF 25  Look into Dexcom 6 CGM.  Please return in 3 months with your sugar log.   - today, HbA1c is 7%  - continue checking sugars at different times of the day - check 4x a day, rotating checks - advised for yearly eye exams >> she is UTD - will check a TSH today.  We will add a B12 level since she is complaining of intermittent numbness and tingling in her feet. - Return to clinic in 3 mo with sugar log    Philemon Kingdom, MD PhD [facility] Endocrinology

## 2018-09-30 ENCOUNTER — Other Ambulatory Visit: Payer: Self-pay | Admitting: Physician Assistant

## 2018-09-30 DIAGNOSIS — F39 Unspecified mood [affective] disorder: Secondary | ICD-10-CM

## 2018-10-02 NOTE — Telephone Encounter (Signed)
Must make follow up appointment 

## 2018-10-13 ENCOUNTER — Ambulatory Visit: Payer: 59 | Admitting: Physician Assistant

## 2018-10-18 ENCOUNTER — Ambulatory Visit (INDEPENDENT_AMBULATORY_CARE_PROVIDER_SITE_OTHER): Payer: 59 | Admitting: Physician Assistant

## 2018-10-18 ENCOUNTER — Encounter: Payer: Self-pay | Admitting: Physician Assistant

## 2018-10-18 VITALS — BP 130/81 | HR 98

## 2018-10-18 DIAGNOSIS — IMO0002 Reserved for concepts with insufficient information to code with codable children: Secondary | ICD-10-CM

## 2018-10-18 DIAGNOSIS — E1143 Type 2 diabetes mellitus with diabetic autonomic (poly)neuropathy: Secondary | ICD-10-CM

## 2018-10-18 DIAGNOSIS — K3184 Gastroparesis: Secondary | ICD-10-CM

## 2018-10-18 DIAGNOSIS — E1042 Type 1 diabetes mellitus with diabetic polyneuropathy: Secondary | ICD-10-CM

## 2018-10-18 DIAGNOSIS — E10649 Type 1 diabetes mellitus with hypoglycemia without coma: Secondary | ICD-10-CM | POA: Diagnosis not present

## 2018-10-18 DIAGNOSIS — F418 Other specified anxiety disorders: Secondary | ICD-10-CM

## 2018-10-18 DIAGNOSIS — F39 Unspecified mood [affective] disorder: Secondary | ICD-10-CM | POA: Diagnosis not present

## 2018-10-18 DIAGNOSIS — E1065 Type 1 diabetes mellitus with hyperglycemia: Secondary | ICD-10-CM

## 2018-10-18 MED ORDER — ALPHA-LIPOIC ACID 600 MG PO CAPS: 600.0000 mg | ORAL_CAPSULE | Freq: Every day | ORAL | 3 refills | Status: DC

## 2018-10-18 MED ORDER — INSULIN LISPRO (1 UNIT DIAL) 100 UNIT/ML (KWIKPEN): PEN_INJECTOR | SUBCUTANEOUS | 0 refills | Status: DC

## 2018-10-18 MED ORDER — PROMETHAZINE HCL 25 MG PO TABS: 25.0000 mg | ORAL_TABLET | Freq: Three times a day (TID) | ORAL | 3 refills | Status: DC | PRN

## 2018-10-18 MED ORDER — URINE GLUCOSE-KETONES TEST VI STRP: ORAL_STRIP | 0 refills | Status: DC

## 2018-10-18 MED ORDER — DULOXETINE HCL 30 MG PO CPEP: 30.0000 mg | ORAL_CAPSULE | Freq: Two times a day (BID) | ORAL | 1 refills | Status: DC

## 2018-10-18 NOTE — Progress Notes (Signed)
HPI:                                                                Veronica Liu is a 22 y.o. female [facility] today for depression follow-up  Depression/Anxiety: states she was doing well on Cymbalta 40 mg, but ran out about a month ago (pharmacy issue) and has really noticed. Endorses depressed mood, fatigue, hopelessness, sleep disturbance. She reports struggling with her health and diabetes symptoms are contributing to her depression. She [facility], but has not seen her in several months.  Denies self harm. Endorses passive SI. Lives at home with mother and sister. Has a supportive boyfriend of 2.5 years.   Type 1 Diabetes: presents today with hypoglycemia, glucose 54. She is not currently symptomatic. She ate 2 mini [employer]ese[employer] cups in the waiting room. Currently on Lantus 36 units daily and Humalog sliding scale. Reports history of gastroparesis, currently controlled. She also reports painful neuropathy in both of her feet. 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  Dep[facility] screen [facility] 10/18/2018 04/28/2018  Decreased Interest 2 2  Down, Depressed, Hopeless 2 2  PHQ - 2 Score 4 4  Altered sleeping 2 3  Tired, decreased energy 3 3  Change in appetite 3 3  Feeling bad or failure about yourself  1 3  Trouble concentrating 1 2  Moving slowly or fidgety/restless 0 1  Suicidal thoughts 1 1  PHQ-9 Score 15 20    GAD 7 : Generalized Anxiety Score 10/18/2018 04/28/2018  Nervous, Anxious, on Edge 3 2  Control/stop worrying 3 2  Worry too much - different things 3 3  Trouble relaxing 2 1  Restless 1 2  Easily annoyed or irritable 1 1  Afraid - awful might happen 2 1  Total GAD 7 Score 15 12  Anxiety Difficulty - Very difficult      Past Medical History:  Diagnosis Date  . Anxiety   . Depression   . Diabetes mellitus type I  ([facility])   . Diabetic gastroparesis ([facility])   . GERD (gastroesophageal reflux disease)    Past Surgical History:  Procedure Laterality Date  . NO PAST SURGERIES     Social History   Tobacco Use  . Smoking status: Current Every Day Smoker    Packs/day: 0.50    Years: 5.00    Pack years: 2.50    Types: Cigarettes  . Smokeless tobacco: Never Used  . Tobacco comment: NA  Substance Use Topics  . Alcohol use: Never    Frequency: Never   family history includes Cancer in her mother; Diabetes in her cousin, father, and paternal grandmother; Hypothyroidism in her mother and paternal grandmother.    ROS: negative except as noted in the HPI  Medications: Current Outpatient Medications  Medication Sig Dispense Refill  . acetone, urine, test strip Check ketones per protocol 50 each 3  . BAYER MICROLET LANCETS lancets Check sugars 6 times daily and per protocol for hyper and hypoglycemia 250 each 3  . glucagon (GLUCAGON EMERGENCY) 1 MG injection Inject 1 mg into the muscle once as needed (for hypoglycemia).  2 each 3  . glucose blood (ONETOUCH VERIO) test strip Checks blood sugar 6x daily 600 each 3  . Insulin Glargine (LANTUS SOLOSTAR) 100 UNIT/ML Solostar Pen Use up to 40 units daily 10 pen 1  . insulin lispro (HUMALOG KWIKPEN) 100 UNIT/ML KwikPen Inject 0.11 mLs (11 Units total) into the skin 4 (four) times daily -  with meals and at bedtime. Inject into skin QID, sliding scale 15 mL 0  . Insulin Pen Needle (INSUPEN PEN NEEDLES) 32G X 4 MM MISC Inject insulin via insulin pen 6 x daily 600 each 4  . promethazine (PHENERGAN) 25 MG tablet Take 1 tablet (25 mg total) by mouth every 8 (eight) hours as needed for nausea or vomiting. 30 tablet 3  . ranitidine (ZANTAC) 150 MG tablet Take 1 tablet (150 mg total) by mouth 2 (two) times daily as needed for heartburn (for acid reflux). 180 tablet 3  . Alpha-Lipoic Acid 600 MG CAPS Take 1 capsule (600 mg total) by mouth daily. 90 capsule 3  . DULoxetine  (CYMBALTA) 30 MG capsule Take 1 capsule (30 mg total) by mouth 2 (two) times daily. 180 capsule 1  . Urine Glucose-Ketones Test STRP Use prn for hyperglycemia 20 strip 0   No current facility-administered medications for this visit.    Allergies  Allergen Reactions  . Levemir [Insulin Detemir]     Hives at injection sites  . Augmentin [Amoxicillin-Pot Clavulanate]     Vomiting as young child  . Omnicef [Cefdinir]     Vomiting as young child.       Objective:  BP 130/81   Pulse 98  Gen:  alert, not ill-appearing, no distress, appropriate for age HEENT: head normocephalic without obvious abnormality, conjunctiva and cornea clear, trachea midline Pulm: Normal work of breathing, normal phonation, clear to auscultation bilaterally, no wheezes, rales or rhonchi CV: Normal rate, regular rhythm, s1 and s2 distinct, no murmurs, clicks or rubs  Neuro: alert and oriented x 3, no tremor MSK: extremities atraumatic, normal gait and station Skin: intact, no rashes on exposed skin, no jaundice, no cyanosis Psych: appearance casual, cooperative, good eye contact, depressed mood, affect mood-congruent, tearful, speech is articulate, and thought processes clear and goal-directed, good insight, normal judgment  Lab Results  Component Value Date   CREATININE 0.55 03/05/2014   BUN 14 03/05/2014   NA 133 (L) 03/05/2014   K 4.5 03/05/2014   CL 99 03/05/2014   CO2 26 03/05/2014   Lab Results  Component Value Date   HGBA1C 9.2 (A) 05/19/2018     No results found for this or any previous visit (from the past 72 hour(s)). No results found.    Assessment and Plan: 22 y.o. female with   .Veronica Liu was seen today for anxiety and depression.  Diagnoses and all orders for this visit:  Mood disorder (HCC) -     DULoxetine (CYMBALTA) 30 MG capsule; Take 1 capsule (30 mg total) by mouth 2 (two) times daily.  Anxiety about health  Hypoglycemia due to type 1 diabetes mellitus ([facility]) -     glucose  chewable tablet 12 g  Diabetic gastroparesis ([facility]) -     promethazine (PHENERGAN) 25 MG tablet; Take 1 tablet (25 mg total) by mouth every 8 (eight) hours as needed for nausea or vomiting.  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 -  Discontinue: insulin lispro (HUMALOG KWIKPEN) 100 UNIT/ML KwikPen; Inject into skin QID, sliding scale -     Urine Glucose-Ketones Test STRP; Use prn for hyperglycemia -     insulin lispro (HUMALOG KWIKPEN) 100 UNIT/ML KwikPen; Inject 0.11 mLs (11 Units total) into the skin 4 (four) times daily -  with meals and at bedtime. Inject into skin QID, sliding scale   PHQ9=15, no acute safety issues, verbally contracted for safety, written safety plan provided at checkout Re-starting Duloxetine Plan to increase to 60 mg QD for diabetic neuropathy Start with 30 mg and increase after 1-2 weeks  Hypoglycemia Patient was given 3 glucose tablets. Glucose increased to 94 in office Refilled short supply of Humalog Encouraged to schedule f/u with Dr. Lafe Garin  Diabetic neuropathy Start alpha-lipoic acid Restart Duloxetine  Patient education and anticipatory guidance given Patient agrees with treatment plan Follow-up in 2 months or sooner as needed if symptoms worsen or fail to improve  Levonne Hubert PA-C

## 2018-10-18 NOTE — Patient Instructions (Signed)
Counseling: - psychologytoday.com: search engine to locate local counselors - Family Services in your county offer counseling on a sliding scale (pay what you can afford) - [facility]: we can place a referral for you to see one of licensed counselors in [place], [place], or [place] - online counseling: BetterHelp and [employer] (not covered by insurance, but affordable self-pay rates)  Other resources: - everydayhealth.com/depression/guide/resources/ - 7cupsoftea - greatist.com/grow/resources-when-you-can-not-afford-therapy  Safety Plan: if having self-harm or suicidal thoughts Our Office 336-992-1770 [facility] Hotline 336-832-9700 National Suicide Hotline 1-800-SUICIDE If in immediate danger of harming yourself, go to the nearest emergency room or call 911     

## 2018-10-19 ENCOUNTER — Encounter: Payer: Self-pay | Admitting: Physician Assistant

## 2018-10-19 DIAGNOSIS — F418 Other specified anxiety disorders: Secondary | ICD-10-CM | POA: Insufficient documentation

## 2018-10-19 DIAGNOSIS — E10649 Type 1 diabetes mellitus with hypoglycemia without coma: Secondary | ICD-10-CM | POA: Insufficient documentation

## 2018-10-19 DIAGNOSIS — R4589 Other symptoms and signs involving emotional state: Secondary | ICD-10-CM | POA: Insufficient documentation

## 2018-10-19 MED ORDER — INSULIN LISPRO (1 UNIT DIAL) 100 UNIT/ML (KWIKPEN): 11.0000 [IU] | PEN_INJECTOR | Freq: Three times a day (TID) | SUBCUTANEOUS | 0 refills | Status: DC

## 2018-10-19 MED ORDER — GLUCOSE 4 G PO CHEW
3.0000 | CHEWABLE_TABLET | Freq: Once | ORAL | Status: AC
  Administered 2018-10-18: 12 g via ORAL

## 2018-11-07 ENCOUNTER — Telehealth: Payer: Self-pay

## 2018-11-07 NOTE — Telephone Encounter (Signed)
Pharmacy is asking for the max dose that Veronica Liu can take while using her insulin so that they can bill her insurance correctly. Please advise. -EH/RMA

## 2018-11-07 NOTE — Telephone Encounter (Signed)
Please tell pharmacy to contact Dr. Lavella HammockGherge's office (her endocrinologist) I refilled this as a courtesy I am not managing her insulin and I do not know her max dose

## 2018-11-08 ENCOUNTER — Telehealth: Payer: Self-pay | Admitting: Internal Medicine

## 2018-11-08 NOTE — Telephone Encounter (Signed)
This was written by her PCP.  Prescribing Provider Encounter Provider  Carlis Stableummings, Charley Elizabeth, PA-C

## 2018-11-08 NOTE — Telephone Encounter (Signed)
Patients last OV was 05/19/18 and the Humalog was ordered by her PCP last- should I call the patient and make a f/u appointment and are you going to manage Humalog for her moving forward-please advise

## 2018-11-08 NOTE — Telephone Encounter (Signed)
Pharmacy called and stated that RX for Humalog needs additional info regarding dosage

## 2018-11-08 NOTE — Telephone Encounter (Signed)
[facility] notified and will contact Dr. Rexene AlbertsGherges office. -EH/RMA

## 2018-11-08 NOTE — Telephone Encounter (Signed)
LMTCB

## 2018-11-08 NOTE — Telephone Encounter (Signed)
She was lost for follow-up with me.If she does return to see me, we can refill her insulin to last her until next visit.  If not, prescription is per PCP.

## 2018-11-16 DIAGNOSIS — E1042 Type 1 diabetes mellitus with diabetic polyneuropathy: Secondary | ICD-10-CM | POA: Diagnosis not present

## 2018-11-16 DIAGNOSIS — Z794 Long term (current) use of insulin: Secondary | ICD-10-CM | POA: Diagnosis not present

## 2018-12-19 ENCOUNTER — Telehealth: Payer: Self-pay

## 2018-12-19 ENCOUNTER — Other Ambulatory Visit: Payer: Self-pay | Admitting: Physician Assistant

## 2018-12-19 DIAGNOSIS — E1065 Type 1 diabetes mellitus with hyperglycemia: Principal | ICD-10-CM

## 2018-12-19 DIAGNOSIS — IMO0002 Reserved for concepts with insufficient information to code with codable children: Secondary | ICD-10-CM

## 2018-12-19 DIAGNOSIS — E1042 Type 1 diabetes mellitus with diabetic polyneuropathy: Secondary | ICD-10-CM

## 2018-12-19 MED ORDER — INSULIN LISPRO (1 UNIT DIAL) 100 UNIT/ML (KWIKPEN): 11.0000 [IU] | PEN_INJECTOR | Freq: Three times a day (TID) | SUBCUTANEOUS | 0 refills | Status: DC

## 2018-12-19 NOTE — Telephone Encounter (Signed)
Mother left vm stating that her daughter used too much insulin and she is now out.  She is requesting more refills. Please advise. -EH/RMA

## 2018-12-19 NOTE — Telephone Encounter (Signed)
Temporary refill of Humalog sent to local pharmacy Patient states she plans to follow-up with Dr. Lafe Garin. She has had some transportation issues. Encouraged her to schedule an Endocrinology appt asap. She was transferred to our schedulers to schedule a follow-up appt with me for her other chronic medical conditions

## 2018-12-23 ENCOUNTER — Other Ambulatory Visit: Payer: Self-pay | Admitting: Physician Assistant

## 2018-12-23 DIAGNOSIS — E1065 Type 1 diabetes mellitus with hyperglycemia: Secondary | ICD-10-CM

## 2019-01-18 ENCOUNTER — Telehealth: Payer: Self-pay | Admitting: Internal Medicine

## 2019-01-18 NOTE — Telephone Encounter (Signed)
MEDICATION: Insulin Glargine (LANTUS SOLOSTAR) 100 UNIT/ML Solostar Pen  PHARMACY:  [id number] - [place], [place] - [address]  IS THIS A 90 DAY SUPPLY :  yes  IS PATIENT OUT OF MEDICATION: no  IF NOT; HOW MUCH IS LEFT: 1 pen  LAST APPOINTMENT DATE: @12 /03/2018  NEXT APPOINTMENT DATE:@Visit  date not found  DO WE HAVE YOUR PERMISSION TO LEAVE A DETAILED MESSAGE:  OTHER COMMENTS:    **Let patient know to contact pharmacy at the end of the day to make sure medication is ready. **  ** Please notify patient to allow 48-72 hours to process**  **Encourage patient to contact the pharmacy for refills or they can request refills through [facility]**

## 2019-01-18 NOTE — Telephone Encounter (Signed)
11/08/18 1:28 PM  Note    She was lost for follow-up with me.If she does return to see me, we can refill her insulin to last her until next visit.  If not, prescription is per PCP

## 2019-01-24 ENCOUNTER — Other Ambulatory Visit: Payer: Self-pay | Admitting: Internal Medicine

## 2019-01-24 DIAGNOSIS — E1065 Type 1 diabetes mellitus with hyperglycemia: Secondary | ICD-10-CM

## 2019-01-25 ENCOUNTER — Other Ambulatory Visit: Payer: Self-pay

## 2019-01-25 ENCOUNTER — Telehealth: Payer: Self-pay | Admitting: Internal Medicine

## 2019-01-25 DIAGNOSIS — E1065 Type 1 diabetes mellitus with hyperglycemia: Secondary | ICD-10-CM

## 2019-01-25 MED ORDER — INSULIN GLARGINE 100 UNIT/ML SOLOSTAR PEN: PEN_INJECTOR | SUBCUTANEOUS | 0 refills | Status: DC

## 2019-01-25 NOTE — Telephone Encounter (Signed)
Temp refill sent until patient is seen.

## 2019-01-25 NOTE — Telephone Encounter (Signed)
MEDICATION: Insulin Glargine (LANTUS SOLOSTAR) 100 UNIT/ML Solostar Pen  PHARMACY:  [id number] - [place], [place] - [address]  IS THIS A 90 DAY SUPPLY :  Yes   IS PATIENT OUT OF MEDICATION:  No  IF NOT; HOW MUCH IS LEFT:  1/2 a Pen   LAST APPOINTMENT DATE: @2 /19/2020  NEXT APPOINTMENT DATE:@2 /27/2020  DO WE HAVE YOUR PERMISSION TO LEAVE A DETAILED MESSAGE:  OTHER COMMENTS:   Patient is needing enough to get her to the next follow up visit next Thursday. Advised the patient's mom that she needing to make this appointment to receive refills from Dr Elvera Lennox        **Let patient know to contact pharmacy at the end of the day to make sure medication is ready. **  ** Please notify patient to allow 48-72 hours to process**  **Encourage patient to contact the pharmacy for refills or they can request refills through [facility]**

## 2019-01-29 ENCOUNTER — Other Ambulatory Visit: Payer: Self-pay | Admitting: Physician Assistant

## 2019-01-29 DIAGNOSIS — Z20828 Contact with and (suspected) exposure to other viral communicable diseases: Secondary | ICD-10-CM

## 2019-01-29 MED ORDER — OSELTAMIVIR PHOSPHATE 75 MG PO CAPS: 75.0000 mg | ORAL_CAPSULE | Freq: Every day | ORAL | 0 refills | Status: DC

## 2019-01-29 MED ORDER — OSELTAMIVIR PHOSPHATE 75 MG PO CAPS: 75.0000 mg | ORAL_CAPSULE | Freq: Every day | ORAL | 0 refills | Status: AC

## 2019-02-01 ENCOUNTER — Encounter: Payer: Self-pay | Admitting: Internal Medicine

## 2019-02-01 ENCOUNTER — Ambulatory Visit (INDEPENDENT_AMBULATORY_CARE_PROVIDER_SITE_OTHER): Payer: 59 | Admitting: Internal Medicine

## 2019-02-01 VITALS — BP 128/80 | HR 117 | Ht 66.0 in | Wt 153.0 lb

## 2019-02-01 DIAGNOSIS — E1065 Type 1 diabetes mellitus with hyperglycemia: Secondary | ICD-10-CM

## 2019-02-01 LAB — POCT GLYCOSYLATED HEMOGLOBIN (HGB A1C): Hemoglobin A1C: 9.1 % — AB (ref 4.0–5.6)

## 2019-02-01 MED ORDER — GLUCAGON 3 MG/DOSE NA POWD: 1.0000 | Freq: Once | NASAL | 99 refills | Status: AC

## 2019-02-01 MED ORDER — INSULIN GLARGINE 100 UNIT/ML SOLOSTAR PEN: PEN_INJECTOR | SUBCUTANEOUS | 3 refills | Status: DC

## 2019-02-01 NOTE — Patient Instructions (Addendum)
Please split: - Lantus 17 units 2x a day  Please continue: -Humalog: ICR 1:5 Target 100 ISF 25  Please return in 4 months.

## 2019-02-01 NOTE — Progress Notes (Signed)
Patient ID: Veronica Liu, female   DOB: [birth year], 23 y.o.   MRN: [id number]  HPI: Veronica Liu is a 23 y.o.-year-old female, returning for follow-up for DM1, uncontrolled, with long-term complications (PN, gastoparesis).  She was previously followed by Dr. Tobe Sos and then Annia Belt with pediatric endocrinology at [facility].  Last visit 8 months ago.  She is here with her mother who offers part of the history.  Reviewed history: Patient has been diagnosed with DM2 at 23 y/o, then DM1 at 23 y/o  She was started on insulin after being diagnosed with DM1. She has been advised  against an insulin pump in the past until she can get better control of her diabetes she noticed that in the last 1.5 years her insulin resistance has decreased.  She controls her diabetes a little better.  She tried to obtain the freestyle libre CGM but this was too expensive.  At last visit, I suggested the Dexcom G6 CGM.  She did obtain this and she loves it.  We downloaded the reports today and we will scan them.  Last hemoglobin A1c was: Lab Results  Component Value Date   HGBA1C 9.2 (A) 05/19/2018   HGBA1C 8.7 08/05/2014   HGBA1C 9.9 03/05/2014  02/27/2018: HbA1c 9.4% 11/11/2017: HbA1c 8.8% 08/16/2017: HbA1c 8.9% 04/18/2017: HbA1c 9.3% 06/23/2016: HbA1c 12%  She was on the following regimen: -Lantus 40 >> 34 units at bedtime -Humalog: ICR 1:6 >> 1:5 (B:~4U)  Target 100 ISF 25  Meter: One Touch Verio  Reviewed Dexcom parameters and traces:  Lowest sugar was 42 last week (during the night) >> 40-50 in am; she has hypoglycemia awareness at 100.  No previous hypoglycemia admission.  She does have a glucagon kit at home. Highest sugar was 475-HI >> 400.  + Previous DKA admissions -last _0 years ago  Pt's meals are (she is very sensitive to discussions about her diet): - Breakfast: skips b/c gastroparesis (nausea) - prev. 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  Her sugars are usually the lowest at that time.  Reviewed previous labs-no CKD, + HL  Previously: Lab Results  Component Value Date   BUN 14 03/05/2014   BUN 8 06/06/2013   CREATININE 0.55 03/05/2014   CREATININE 0.43 (L) 06/06/2013   Lab Results  Component Value Date   CHOL 203 (H) 03/05/2014   HDL 47 03/05/2014   LDLCALC 133 (H) 03/05/2014   TRIG 116 03/05/2014   CHOLHDL 4.3 03/05/2014   - last eye exam was in 2018: No DR -+ Occasional numbness and tingling in her feet.  Last TSH was normal: Lab Results  Component Value Date   TSH 1.053 03/05/2014   Pt has FH of DM2 in father and PGM.  Cousin with DM1.  She has a history of depression She smokes half a pack a day.    ROS: Constitutional: no weight gain/no weight loss, no fatigue, no subjective hyperthermia, no subjective hypothermia Eyes: no blurry vision, no xerophthalmia ENT: no sore throat, no nodules palpated in neck, no dysphagia, no odynophagia, no hoarseness Cardiovascular: no CP/no SOB/no palpitations/no leg swelling Respiratory: no cough/no SOB/no wheezing Gastrointestinal: no N/no V/no D/no C/no acid reflux Musculoskeletal: no muscle aches/no joint aches Skin: no rashes, no hair loss Neurological: no tremors/no numbness/no tingling/no dizziness  I  reviewed pt's medications, allergies, PMH, social hx, family hx, and changes were documented in the history of present illness. Otherwise, unchanged from my initial visit note.  Past Medical History:  Diagnosis Date  . Anxiety   . Depression   . Diabetes mellitus type I ([place])   . Diabetic gastroparesis ([place])   . GERD (gastroesophageal reflux disease)    Past Surgical History:  Procedure Laterality Date  . NO PAST SURGERIES     Social History   Socioeconomic History  . Marital status: Single    Spouse name: Not on file  . Number of children:  0  Occupational History  . N/a  Tobacco Use  . Smoking status: Current Every Day Smoker    Packs/day: 0.50    Years: 5.00    Pack years: 2.50    Types: Cigarettes  . Smokeless tobacco: Never Used  . Tobacco comment: NA  Substance and Sexual Activity  . Alcohol use: Never    Frequency: Never  . Drug use: Never  . Sexual activity: Yes    Birth control/protection: None, Condom  Social History Narrative   Lives with mom, step-dad, brother, and sister. Sees bio-dad twice monthly.    Current Outpatient Medications on File Prior to Visit  Medication Sig Dispense Refill  . acetone, urine, test strip Check ketones per protocol 50 each 3  . BAYER MICROLET LANCETS lancets Check sugars 6 times daily and per protocol for hyper and hypoglycemia 250 each 3  . DULoxetine (CYMBALTA) 30 MG capsule Take 1 capsule (30 mg total) by mouth 2 (two) times daily. 180 capsule 1  . glucagon (GLUCAGON EMERGENCY) 1 MG injection Inject 1 mg into the muscle once as needed (for hypoglycemia). 2 each 3  . glucose blood (ONETOUCH VERIO) test strip Checks blood sugar 6x daily 600 each 3  . HUMALOG KWIKPEN 100 UNIT/ML KwikPen INJECT 11 UNITS UNDER THE SKIN FOUR TIMES DAILY WITH MEALS AND AT BEDTIME PER SLIDING SCALE 39 mL 0  . Insulin Glargine (LANTUS SOLOSTAR) 100 UNIT/ML Solostar Pen Use up to 40 units daily. Must have appointment for further refills. 5 pen 0  . Insulin Pen Needle (INSUPEN PEN NEEDLES) 32G X 4 MM MISC Inject insulin via insulin pen 6 x daily 600 each 4  . promethazine (PHENERGAN) 25 MG tablet Take 1 tablet (25 mg total) by mouth every 8 (eight) hours as needed for nausea or vomiting. 30 tablet 3  . Urine Glucose-Ketones Test STRP Use prn for hyperglycemia 20 strip 0  . oseltamivir (TAMIFLU) 75 MG capsule Take 1 capsule (75 mg total) by mouth daily for 10 days. (Patient not taking: Reported on 02/01/2019) 10 capsule 0   No current facility-administered medications on file prior to visit.    Allergies   Allergen Reactions  . Levemir [Insulin Detemir]     Hives at injection sites  . Augmentin [Amoxicillin-Pot Clavulanate]     Vomiting as young child  . Omnicef [Cefdinir]     Vomiting as young child.   Family History  Problem Relation Age of Onset  . Hypothyroidism Mother   . Cancer Mother        appendix cancer  . Diabetes Father        type 2  . Hypothyroidism Paternal Grandmother   . Diabetes Paternal Grandmother        type 2  . Diabetes Cousin        type 1    PE: BP 128/80   Pulse Marland Kitchen)  117   Ht 5' 6" (1.676 m) Comment: measured  Wt 153 lb (69.4 kg)   SpO2 98%   BMI 24.69 kg/m  Wt Readings from Last 3 Encounters:  02/01/19 153 lb (69.4 kg)  05/19/18 151 lb (68.5 kg)  04/28/18 149 lb (67.6 kg)   Constitutional: Normal weight, in NAD Eyes: PERRLA, EOMI, no exophthalmos ENT: moist mucous membranes, no thyromegaly, no cervical lymphadenopathy Cardiovascular: Tachycardia RR, No MRG Respiratory: CTA B Gastrointestinal: abdomen soft, NT, ND, BS+ Musculoskeletal: no deformities, strength intact in all 4 Skin: moist, warm, no rashes Neurological: no tremor with outstretched hands, DTR normal in all 4  ASSESSMENT: 1. DM1, uncontrolled, without long-term complications, but with hyperglycemia  PLAN:  1. Patient with longstanding, uncontrolled, type 1 diabetes, on basal-bolus insulin regimen.  Since last visit, she obtained a Dexcom CGM and she is very happy with it.  She feels safer and also her mother is able to be alerted when patient sugars are dropping.   - She did have lows since last visit, in the middle of the night, in the 40s or 50s and she reduced the Lantus dose since then.  However, she is still dropping her sugar significantly from bed time to morning, a sign that her Lantus dose is still too high to be taken only at bedtime.  Unfortunately, she was not able to get [place] due to price.  Her sugars are maintained in a very good range between 7 AM and 2 PM when  she is working, but they increase significantly afterwards, towards bedtime.  She is not missing doses of insulin with lunch or dinner.  Her insulin to carb ratio appears strict enough, as is her ISF.  Therefore, my suspicion is that her Lantus is speaking in her envelope not lasting her 24 hours.  At this visit, I advised her to split the dose into, and take half in the morning and half at night.  She agrees with the plan.  I advised her that she may need to change the proportion of the dose taken at each time, but I do not feel that she needs more long-acting insulin at this point. -At last visit, we discussed about an insulin pump and I advised her to stay open to this possibility. -I suggested to: Patient Instructions  Please split: - Lantus 17 units 2x a day  Please continue: -Humalog: ICR 1:5 Target 100 ISF 25  Please return in 4 months.   - today, HbA1c is 9.1% (stable) - continue checking sugars at different times of the day - check >4x a day with the Dexcom CGM - advised for yearly eye exams >> she is due - She has an appointment with PCP coming up.  She is due for CMP, lipids, ACR, and TSH.  If not checked at next appointment with PCP, we will check them when she returns, in 4 months. - Return to clinic in 4 mo with sugar log  - time spent with the patient and her mother: 25 min, of which >50% was spent in reviewing her CGM downloads, discussing her hypo- and hyper-glycemic episodes, reviewing previous labs and insulin doses and developing a plan to avoid hypo- and hyper-glycemia.   Philemon Kingdom, MD PhD [facility] Endocrinology

## 2019-03-01 DIAGNOSIS — E1042 Type 1 diabetes mellitus with diabetic polyneuropathy: Secondary | ICD-10-CM | POA: Diagnosis not present

## 2019-03-01 DIAGNOSIS — Z794 Long term (current) use of insulin: Secondary | ICD-10-CM | POA: Diagnosis not present

## 2019-04-24 ENCOUNTER — Other Ambulatory Visit: Payer: Self-pay

## 2019-04-24 ENCOUNTER — Other Ambulatory Visit: Payer: Self-pay | Admitting: Physician Assistant

## 2019-04-24 DIAGNOSIS — E1065 Type 1 diabetes mellitus with hyperglycemia: Secondary | ICD-10-CM

## 2019-04-24 DIAGNOSIS — F39 Unspecified mood [affective] disorder: Secondary | ICD-10-CM

## 2019-04-24 MED ORDER — INSULIN GLARGINE 100 UNIT/ML SOLOSTAR PEN: PEN_INJECTOR | SUBCUTANEOUS | 3 refills | Status: DC

## 2019-04-26 ENCOUNTER — Ambulatory Visit: Payer: 59 | Admitting: Internal Medicine

## 2019-06-01 ENCOUNTER — Other Ambulatory Visit: Payer: Self-pay | Admitting: Physician Assistant

## 2019-06-01 DIAGNOSIS — F39 Unspecified mood [affective] disorder: Secondary | ICD-10-CM

## 2019-06-05 ENCOUNTER — Other Ambulatory Visit: Payer: Self-pay

## 2019-06-06 ENCOUNTER — Telehealth: Payer: Self-pay | Admitting: Nutrition

## 2019-06-06 NOTE — Telephone Encounter (Signed)
Discussed with her how to download her Dexcom readings to the internet for [place] to view remotely.  We discussed the advantages of doing this  Email was sent to her with the sharing code.  She will call if questions.

## 2019-06-07 ENCOUNTER — Ambulatory Visit (INDEPENDENT_AMBULATORY_CARE_PROVIDER_SITE_OTHER): Payer: 59 | Admitting: Internal Medicine

## 2019-06-07 ENCOUNTER — Other Ambulatory Visit: Payer: Self-pay

## 2019-06-07 ENCOUNTER — Encounter: Payer: Self-pay | Admitting: Internal Medicine

## 2019-06-07 VITALS — BP 148/88 | HR 88 | Ht 66.0 in | Wt 150.0 lb

## 2019-06-07 DIAGNOSIS — E1065 Type 1 diabetes mellitus with hyperglycemia: Secondary | ICD-10-CM

## 2019-06-07 DIAGNOSIS — E1042 Type 1 diabetes mellitus with diabetic polyneuropathy: Secondary | ICD-10-CM

## 2019-06-07 DIAGNOSIS — IMO0002 Reserved for concepts with insufficient information to code with codable children: Secondary | ICD-10-CM

## 2019-06-07 LAB — POCT GLYCOSYLATED HEMOGLOBIN (HGB A1C): Hemoglobin A1C: 9 % — AB (ref 4.0–5.6)

## 2019-06-07 MED ORDER — LANTUS SOLOSTAR 100 UNIT/ML ~~LOC~~ SOPN: PEN_INJECTOR | SUBCUTANEOUS | 3 refills | Status: DC

## 2019-06-07 MED ORDER — METFORMIN HCL 500 MG PO TABS: 1000.0000 mg | ORAL_TABLET | Freq: Every day | ORAL | 3 refills | Status: DC

## 2019-06-07 MED ORDER — INSULIN LISPRO (1 UNIT DIAL) 100 UNIT/ML (KWIKPEN): PEN_INJECTOR | SUBCUTANEOUS | 0 refills | Status: DC

## 2019-06-07 NOTE — Patient Instructions (Addendum)
Please increase: - Lantus 20 units 2x a day  Please continue: - Humalog: ICR 1:5 Target 100 ISF 25  Please start Metformin 500 mg with dinner x 4 days. If you tolerate this well, add another Metformin tablet (total 1000 mg) with dinner.  Please return in 4 months.

## 2019-06-07 NOTE — Progress Notes (Signed)
Patient ID: Veronica Liu, female   DOB: [birth year], 23 y.o.   MRN: [id number]  HPI: Veronica Liu is a 23 y.o.-year-old female, returning for follow-up for DM1, uncontrolled, with long-term complications (PN, gastoparesis).  She was previously followed by Dr. Tobe Sos and then Annia Belt with pediatric endocrinology at [facility].  Last visit with me 4 months ago.  She quit smoking 2 mo ago, moved from her mother's home to boyfriend's house, and lost her job >> gained weight and sugars are higher. She did start to exercise approximately a week ago: Short walk after dinner followed by yoga.  Reviewed history: Patient has been diagnosed with DM2 at 23 y/o, then DM1 at 23 y/o.  She was started on insulin after being diagnosed with DM1. She has been advised  against an insulin pump in the past until she can get better control of her diabetes she noticed that in the last 1.5 years her insulin resistance has decreased.  She controls her diabetes a little better.  She continues on a Dexcom CGM -we downloaded and analyzed the traces.  Last hemoglobin A1c was: Lab Results  Component Value Date   HGBA1C 9.1 (A) 02/01/2019   HGBA1C 9.2 (A) 05/19/2018   HGBA1C 8.7 08/05/2014  02/27/2018: HbA1c 9.4% 11/11/2017: HbA1c 8.8% 08/16/2017: HbA1c 8.9% 04/18/2017: HbA1c 9.3% 06/23/2016: HbA1c 12%  She was on the following regimen: -Lantus 40 >> 34 units at bedtime >> 17 units 2x a day -Humalog: ICR 1:6 >> 1:5 Target 100 ISF 25  Meter: One Touch Verio  She checks her sugars more than 4 times a day with her  Dexcom CGM.  Reviewed the CGM parameters and tracings:  Previously:  Lowest sugar was 42 last week (during the night) >> 40-50 in am >> 60; she has hypoglycemia awareness. No previous hypoglycemia admission.  She does have a glucagon kit at home. Highest sugar was 475-HI >> 400 >> 400.  + Previous DKA admissions, none since last visit  Pt's meals are (she is very sensitive to discussions about  her diet): - Breakfast: skips b/c gastroparesis (nausea) - prev. 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  Her sugars are usually the lowest when she works.  Reviewed previous labs-no CKD, + HL:  Previously: Lab Results  Component Value Date   BUN 14 03/05/2014   BUN 8 06/06/2013   CREATININE 0.55 03/05/2014   CREATININE 0.43 (L) 06/06/2013   Lab Results  Component Value Date   CHOL 203 (H) 03/05/2014   HDL 47 03/05/2014   LDLCALC 133 (H) 03/05/2014   TRIG 116 03/05/2014   CHOLHDL 4.3 03/05/2014   - last eye exam was in 2018: No DR -+ Occasional numbness and tingling in her feet.  Last TSH was normal: Lab Results  Component Value Date   TSH 1.053 03/05/2014   Pt has FH of DM2 in father and PGM.  Cousin with DM1.  She has a history of depression. She smokes half a pack a day.    ROS: Constitutional: + weight gain/no weight loss, no fatigue, no subjective hyperthermia, no subjective hypothermia Eyes: no blurry vision, no xerophthalmia ENT: no sore throat, no nodules palpated in neck, no dysphagia, no odynophagia, no hoarseness Cardiovascular: no CP/no SOB/no palpitations/no leg swelling Respiratory: no cough/no SOB/no wheezing Gastrointestinal: no N/no V/no D/no C/no acid reflux Musculoskeletal:  no muscle aches/no joint aches Skin: no rashes, no hair loss Neurological: no tremors/+ numbness/+ tingling/no dizziness  I reviewed pt's medications, allergies, PMH, social hx, family hx, and changes were documented in the history of present illness. Otherwise, unchanged from my initial visit note.  Past Medical History:  Diagnosis Date  . Anxiety   . Depression   . Diabetes mellitus type I ([place])   . Diabetic gastroparesis ([place])   . GERD (gastroesophageal reflux disease)    Past Surgical History:  Procedure Laterality Date  . NO PAST SURGERIES      Social History   Socioeconomic History  . Marital status: Single    Spouse name: Not on file  . Number of children: 0  Occupational History  . N/a  Tobacco Use  . Smoking status: Current Every Day Smoker    Packs/day: 0.50    Years: 5.00    Pack years: 2.50    Types: Cigarettes  . Smokeless tobacco: Never Used  . Tobacco comment: NA  Substance and Sexual Activity  . Alcohol use: Never    Frequency: Never  . Drug use: Never  . Sexual activity: Yes    Birth control/protection: None, Condom  Social History Narrative   Lives with mom, step-dad, brother, and sister. Sees bio-dad twice monthly.    Current Outpatient Medications on File Prior to Visit  Medication Sig Dispense Refill  . acetone, urine, test strip Check ketones per protocol 50 each 3  . BAYER MICROLET LANCETS lancets Check sugars 6 times daily and per protocol for hyper and hypoglycemia 250 each 3  . DULoxetine (CYMBALTA) 30 MG capsule Take 1 capsule (30 mg total) by mouth 2 (two) times daily. Due for follow up visit w/PCP 60 capsule 0  . glucagon (GLUCAGON EMERGENCY) 1 MG injection Inject 1 mg into the muscle once as needed (for hypoglycemia). 2 each 3  . glucose blood (ONETOUCH VERIO) test strip Checks blood sugar 6x daily 600 each 3  . HUMALOG KWIKPEN 100 UNIT/ML KwikPen INJECT 11 UNITS UNDER THE SKIN FOUR TIMES DAILY WITH MEALS AND AT BEDTIME PER SLIDING SCALE 39 mL 0  . Insulin Glargine (LANTUS SOLOSTAR) 100 UNIT/ML Solostar Pen Use up to 40 units daily. 15 pen 3  . Insulin Pen Needle (INSUPEN PEN NEEDLES) 32G X 4 MM MISC Inject insulin via insulin pen 6 x daily 600 each 4  . promethazine (PHENERGAN) 25 MG tablet Take 1 tablet (25 mg total) by mouth every 8 (eight) hours as needed for nausea or vomiting. 30 tablet 3  . Urine Glucose-Ketones Test STRP Use prn for hyperglycemia 20 strip 0   No current facility-administered medications on file prior to visit.    Allergies  Allergen Reactions  . Levemir [Insulin  Detemir]     Hives at injection sites  . Augmentin [Amoxicillin-Pot Clavulanate]     Vomiting as young child  . Omnicef [Cefdinir]     Vomiting as young child.   Family History  Problem Relation Age of Onset  . Hypothyroidism Mother   . Cancer Mother        appendix cancer  . Diabetes Father        type 2  . Hypothyroidism Paternal Grandmother   . Diabetes Paternal Grandmother        type 2  . Diabetes Cousin        type 1    PE: BP (!) 148/88   Pulse 88   Ht 5' 6"  (1.676 m)  Wt 170 lb   SpO2 98%   BMI 24.21 kg/m  Wt Readings from Last 3 Encounters:  06/07/19 170 lb (68 kg)  02/01/19 153 lb (69.4 kg)  05/19/18 151 lb (68.5 kg)   Constitutional: normal weight, in NAD Eyes: PERRLA, EOMI, no exophthalmos ENT: moist mucous membranes, no thyromegaly, no cervical lymphadenopathy Cardiovascular: RRR, No MRG Respiratory: CTA B Gastrointestinal: abdomen soft, NT, ND, BS+ Musculoskeletal: no deformities, strength intact in all 4 Skin: moist, warm, no rashes Neurological: no tremor with outstretched hands, DTR normal in all 4  ASSESSMENT: 1. DM1, uncontrolled, without long-term complications, but with hyperglycemia  PLAN:  1. Patient with longstanding, uncontrolled, type 1 diabetes, on basal-bolus insulin regimen.  She continues on a Dexcom CGM and she is very happy with it.  She feels safer and also her mother is able to be alerted when patient's sugars are dropping. -At last visit, she was having lows in the middle of the night, in the 40s and 50s and she had to reduce the Lantus dose at that time.  However, she was still dropping her sugar significantly from that time to morning, + with her Lantus dose was still too high to be only taken at bedtime.  She was not able to get [place] due to price. She was having very good sugars between 7 AM and 2 PM when she was working but they were significantly increased afterwards, towards bedtime. We discussed about splitting the Lantus  into doses and she started to do so since last visit.  Her insulin to carb ratio and insulin sensitivity factor appeared to be adequate.   -At this visit, sugars remain high, only a little improved from before after splitting Lantus.  However, she recently again a significant amount of weight and she is grazing throughout the day as she is hungry all the time after she stopped smoking.  She is exercising after dinner, but her sugars still increase significantly in the evening and stay high the entire night.  I will increase her Lantus at this visit and will keep it in a twice daily dose.  We also discussed about the possibility of starting metformin to help with overnight sugars.  This will also help reducing her appetite.  I advised her to stop metformin right away if she gets dehydrated.  She was on metformin at the beginning of her diagnosis. -I suggested to: Patient Instructions  Please increase: - Lantus 20 units 2x a day  Please continue: - Humalog: ICR 1:5 Target 100 ISF 25  Please start Metformin 500 mg with dinner x 4 days. If you tolerate this well, add another Metformin tablet (total 1000 mg) with dinner.  Please return in 4 months.  - today, HbA1c is 9.0% (stable) - continue checking sugars at different times of the day - check >4x a day -with her Dexcom CGM - advised for yearly eye exams >> she is not UTD - We will check annual labs today - Return to clinic in 4 mo with sugar log   - time spent with the patient: 30 min, of which >50% was spent in reviewing her sugar levels and insulin doses, discussing her hypo- and hyper-glycemic episodes, reviewing previous labs and pump settings and developing a plan to avoid hypo- and hyper-glycemia.   Office Visit on 06/07/2019  Component Date Value Ref Range Status  . Creatinine, Urine 06/07/2019 110  20 - 275 mg/dL Final  . Microalb, Ur 06/07/2019 0.4  mg/dL Final  Comment: Reference Range Not established   . Microalb Creat Ratio  06/07/2019 4  <30 mcg/mg creat Final   Comment: . The ADA defines abnormalities in albumin excretion as follows: Marland Kitchen Category         Result (mcg/mg creatinine) . Normal                    <30 Microalbuminuria         30-299  Clinical albuminuria   > OR = 300 . The ADA recommends that at least two of three specimens collected within a 3-6 month period be abnormal before considering a patient to be within a diagnostic category.   . Glucose, Bld 06/07/2019 183* 65 - 99 mg/dL Final   Comment: .            Fasting reference interval . For someone without known diabetes, a glucose value >125 mg/dL indicates that they may have diabetes and this should be confirmed with a follow-up test. .   . BUN 06/07/2019 13  7 - 25 mg/dL Final  . Creat 06/07/2019 0.66  0.50 - 1.10 mg/dL Final  . GFR, Est Non African American 06/07/2019 126  > OR = 60 mL/min/1.59m Final  . GFR, Est African American 06/07/2019 146  > OR = 60 mL/min/1.739mFinal  . BUN/Creatinine Ratio 0789/37/3428OT APPLICABLE  6 - 22 (calc) Final  . Sodium 06/07/2019 138  135 - 146 mmol/L Final  . Potassium 06/07/2019 3.9  3.5 - 5.3 mmol/L Final  . Chloride 06/07/2019 101  98 - 110 mmol/L Final  . CO2 06/07/2019 28  20 - 32 mmol/L Final  . Calcium 06/07/2019 9.7  8.6 - 10.2 mg/dL Final  . Total Protein 06/07/2019 6.9  6.1 - 8.1 g/dL Final  . Albumin 06/07/2019 4.3  3.6 - 5.1 g/dL Final  . Globulin 06/07/2019 2.6  1.9 - 3.7 g/dL (calc) Final  . AG Ratio 06/07/2019 1.7  1.0 - 2.5 (calc) Final  . Total Bilirubin 06/07/2019 0.7  0.2 - 1.2 mg/dL Final  . Alkaline phosphatase (APISO) 06/07/2019 54  31 - 125 U/L Final  . AST 06/07/2019 15  10 - 30 U/L Final  . ALT 06/07/2019 16  6 - 29 U/L Final  . TSH 06/07/2019 1.37  mIU/L Final   Comment:           Reference Range .           > or = 20 Years  0.40-4.50 .                Pregnancy Ranges           First trimester    0.26-2.66           Second trimester   0.55-2.73            Third trimester    0.43-2.91   . Cholesterol 06/07/2019 221* <200 mg/dL Final  . HDL 06/07/2019 76  > OR = 50 mg/dL Final  . Triglycerides 06/07/2019 77  <150 mg/dL Final  . LDL Cholesterol (Calc) 06/07/2019 128* mg/dL (calc) Final   Comment: Reference range: <100 . Desirable range <100 mg/dL for primary prevention;   <70 mg/dL for patients with CHD or diabetic patients  with > or = 2 CHD risk factors. . Marland KitchenDL-C is now calculated using the Martin-Hopkins  calculation, which is a validated novel method providing  better accuracy than the Friedewald equation in the  estimation of LDL-C.  Cresenciano Genre et al. Annamaria Helling. 2952;841(32): 2061-2068  (http://education.[facility].com/faq/FAQ164)   . Total CHOL/HDL Ratio 06/07/2019 2.9  <5.0 (calc) Final  . Non-HDL Cholesterol (Calc) 06/07/2019 145* <130 mg/dL (calc) Final   Comment: For patients with diabetes plus 1 major ASCVD risk  factor, treating to a non-HDL-C goal of <100 mg/dL  (LDL-C of <70 mg/dL) is considered a therapeutic  option.   . Hemoglobin A1C 06/07/2019 9.0* 4.0 - 5.6 % Final   Labs are normal with the exception of higher blood sugar and also high cholesterol.  Advised to reduce fatty foods and include a lot of whole grains and fiber in her diet.  Philemon Kingdom, MD PhD [facility] Endocrinology

## 2019-06-08 LAB — LIPID PANEL
Cholesterol: 221 mg/dL — ABNORMAL HIGH (ref <200)
HDL: 76 mg/dL (ref >=50)
LDL Cholesterol (Calc): 128 mg/dL (calc) — ABNORMAL HIGH
Non-HDL Cholesterol (Calc): 145 mg/dL (calc) — ABNORMAL HIGH (ref <130)
Total CHOL/HDL Ratio: 2.9 (calc) (ref <5.0)
Triglycerides: 77 mg/dL (ref <150)

## 2019-06-08 LAB — COMPLETE METABOLIC PANEL WITH GFR
AG Ratio: 1.7 (calc) (ref 1.0–2.5)
ALT: 16 U/L (ref 6–29)
AST: 15 U/L (ref 10–30)
Albumin: 4.3 g/dL (ref 3.6–5.1)
Alkaline phosphatase (APISO): 54 U/L (ref 31–125)
BUN: 13 mg/dL (ref 7–25)
CO2: 28 mmol/L (ref 20–32)
Calcium: 9.7 mg/dL (ref 8.6–10.2)
Chloride: 101 mmol/L (ref 98–110)
Creat: 0.66 mg/dL (ref 0.50–1.10)
GFR, Est African American: 146 mL/min/{1.73_m2} (ref >=60)
GFR, Est Non African American: 126 mL/min/{1.73_m2} (ref >=60)
Globulin: 2.6 g/dL (calc) (ref 1.9–3.7)
Glucose, Bld: 183 mg/dL — ABNORMAL HIGH (ref 65–99)
Potassium: 3.9 mmol/L (ref 3.5–5.3)
Sodium: 138 mmol/L (ref 135–146)
Total Bilirubin: 0.7 mg/dL (ref 0.2–1.2)
Total Protein: 6.9 g/dL (ref 6.1–8.1)

## 2019-06-08 LAB — MICROALBUMIN / CREATININE URINE RATIO
Creatinine, Urine: 110 mg/dL (ref 20–275)
Microalb Creat Ratio: 4 mcg/mg creat (ref <30)
Microalb, Ur: 0.4 mg/dL

## 2019-06-08 LAB — TSH: TSH: 1.37 mIU/L

## 2019-06-10 ENCOUNTER — Other Ambulatory Visit: Payer: Self-pay | Admitting: Physician Assistant

## 2019-06-10 DIAGNOSIS — F39 Unspecified mood [affective] disorder: Secondary | ICD-10-CM

## 2019-10-09 ENCOUNTER — Encounter: Payer: Self-pay | Admitting: Internal Medicine

## 2019-10-09 ENCOUNTER — Other Ambulatory Visit: Payer: Self-pay

## 2019-10-09 ENCOUNTER — Ambulatory Visit (INDEPENDENT_AMBULATORY_CARE_PROVIDER_SITE_OTHER): Payer: BC Managed Care – PPO | Admitting: Internal Medicine

## 2019-10-09 DIAGNOSIS — IMO0002 Reserved for concepts with insufficient information to code with codable children: Secondary | ICD-10-CM | POA: Insufficient documentation

## 2019-10-09 DIAGNOSIS — Z23 Encounter for immunization: Secondary | ICD-10-CM

## 2019-10-09 DIAGNOSIS — E1042 Type 1 diabetes mellitus with diabetic polyneuropathy: Secondary | ICD-10-CM

## 2019-10-09 DIAGNOSIS — E1065 Type 1 diabetes mellitus with hyperglycemia: Secondary | ICD-10-CM | POA: Diagnosis not present

## 2019-10-09 LAB — POCT GLYCOSYLATED HEMOGLOBIN (HGB A1C): Hemoglobin A1C: 9.1 % — AB (ref 4.0–5.6)

## 2019-10-09 MED ORDER — LANTUS SOLOSTAR 100 UNIT/ML ~~LOC~~ SOPN: PEN_INJECTOR | SUBCUTANEOUS | 3 refills | Status: DC

## 2019-10-09 MED ORDER — DEXCOM G6 TRANSMITTER MISC: 1.0000 | 3 refills | Status: DC

## 2019-10-09 MED ORDER — DEXCOM G6 SENSOR MISC: 1.0000 | 3 refills | Status: DC

## 2019-10-09 MED ORDER — ONETOUCH VERIO W/DEVICE KIT: PACK | Status: AC

## 2019-10-09 MED ORDER — ONETOUCH VERIO VI STRP: ORAL_STRIP | 11 refills | Status: DC

## 2019-10-09 MED ORDER — INSULIN LISPRO (1 UNIT DIAL) 100 UNIT/ML (KWIKPEN): PEN_INJECTOR | SUBCUTANEOUS | 3 refills | Status: DC

## 2019-10-09 MED ORDER — INSUPEN PEN NEEDLES 32G X 4 MM MISC: 11 refills | Status: DC

## 2019-10-09 MED ORDER — TRESIBA FLEXTOUCH 200 UNIT/ML ~~LOC~~ SOPN: 40.0000 [IU] | PEN_INJECTOR | Freq: Every day | SUBCUTANEOUS | 5 refills | Status: DC

## 2019-10-09 MED ORDER — DEXCOM G6 RECEIVER DEVI: 1.0000 | Freq: Once | 0 refills | Status: DC

## 2019-10-09 NOTE — Progress Notes (Signed)
Patient ID: Veronica Liu, female   DOB: [birth year], 23 y.o.   MRN: [id number]  HPI: Veronica Liu is a 23 y.o.-year-old female, returning for follow-up for DM1, uncontrolled, with long-term complications (PN, gastoparesis).  She was previously followed by Dr. Tobe Sos and then Annia Belt with pediatric endocrinology at [facility].  Last visit with me 4 months ago.  At last visit sugars were higher after she moved from her mother's home to boyfriend's house and lost her job.  She quit smoking and gained weight.  Sugars were higher.    Unfortunately, the situation did not get better afterwards.  She broke up with her boyfriend of 3 years.  She is now sleeping on the couch of friends.  She could not take care of her diabetes as she wanted.  Also, she has another insurance now and she could not get her Dexcom CGM.  She is able to get her insulins without problems.  Reviewed history: Patient has been diagnosed with DM2 at 23 y/o, then DM1 at 23 y/o.  She was started on insulin after being diagnosed with DM1. She has been advised  against an insulin pump in the past until she can get better control of her diabetes...  Reviewed HbA1c levels: Lab Results  Component Value Date   HGBA1C 9.0 (A) 06/07/2019   HGBA1C 9.1 (A) 02/01/2019   HGBA1C 9.2 (A) 05/19/2018  02/27/2018: HbA1c 9.4% 11/11/2017: HbA1c 8.8% 08/16/2017: HbA1c 8.9% 04/18/2017: HbA1c 9.3% 06/23/2016: HbA1c 12%  She was on the following regimen: -Lantus 40 >> 34 units at bedtime >> 17 >> 20 units 2x a day -Humalog: ICR 1:6 >> 1:5 Target 100 ISF 25 We tried Metformin 1000 mg with dinner-added 06/2019 >> stopped in 2-3 days b/c Diarrhea.  Meter: One Touch Verio  She checks her sugars 4x a day: - am: 200-220 - 2h after b'fast: n/c - lunch: 280-300 - 2h after lunch: n/c - dinner: low 200s - 2h after dinner: n/c - bedtime: 220-230 (takes 5-6 units) - nighttime: can drop 2x a mo: 60s now, prev, on the Dexcom 40s (used too much  insulin for dinner). Lowest sugar was 42 last week (during the night) >> 40-50 in am >> 60 >> 40-60; she has hypoglycemia awareness.  No previous hypoglycemia admission.  She does have a glucagon kit at home. Highest sugar was 475-HI ... >> 400 >> 440.  She has a history of DKA admissions but none since last visit.  Stopped Dexcom 09/13/2019.  Previously:    Pt's meals are (she is very sensitive to discussions about her diet): - Breakfast: skips b/c gastroparesis (nausea) - prev. 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  No CKD: Lab Results  Component Value Date   BUN 13 06/07/2019   BUN 14 03/05/2014   CREATININE 0.66 06/07/2019   CREATININE 0.55 03/05/2014   She has hyperlipidemia: Lab Results  Component Value Date   CHOL 221 (H) 06/07/2019   HDL 76 06/07/2019   LDLCALC 128 (H) 06/07/2019   TRIG 77 06/07/2019   CHOLHDL 2.9 06/07/2019  We did not start a statin but we discussed about increasing vegetables and fruit and also whole grains, while reducing fatty foods.  Previous labs:   - last eye exam was in 2018: No DR -+ Occasional numbness and tingling in  her feet.  Latest TSH was normal: Lab Results  Component Value Date   TSH 1.37 06/07/2019   Pt has FH of DM2 in father and PGM.  Cousin with DM1.  She has a history of depression. She smokes half a pack a day.    ROS: Constitutional: + Weight gain/no weight loss, no fatigue, no subjective hyperthermia, no subjective hypothermia Eyes: no blurry vision, no xerophthalmia ENT: no sore throat, no nodules palpated in neck, no dysphagia, no odynophagia, no hoarseness Cardiovascular: no CP/no SOB/no palpitations/no leg swelling Respiratory: no cough/no SOB/no wheezing Gastrointestinal: + N/no V/no D/no C/no acid reflux, + abdominal pain Musculoskeletal: no muscle aches/no joint  aches Skin: no rashes, no hair loss Neurological: no tremors/+ numbness/+ tingling/no dizziness  I reviewed pt's medications, allergies, PMH, social hx, family hx, and changes were documented in the history of present illness. Otherwise, unchanged from my initial visit note.  Past Medical History:  Diagnosis Date  . Anxiety   . Depression   . Diabetes mellitus type I ([place])   . Diabetic gastroparesis ([place])   . GERD (gastroesophageal reflux disease)    Past Surgical History:  Procedure Laterality Date  . NO PAST SURGERIES     Social History   Socioeconomic History  . Marital status: Single    Spouse name: Not on file  . Number of children: 0  Occupational History  . N/a  Tobacco Use  . Smoking status: Current Every Day Smoker    Packs/day: 0.50    Years: 5.00    Pack years: 2.50    Types: Cigarettes  . Smokeless tobacco: Never Used  . Tobacco comment: NA  Substance and Sexual Activity  . Alcohol use: Never    Frequency: Never  . Drug use: Never  . Sexual activity: Yes    Birth control/protection: None, Condom  Social History Narrative   Lives with mom, step-dad, brother, and sister. Sees bio-dad twice monthly.    Current Outpatient Medications on File Prior to Visit  Medication Sig Dispense Refill  . acetone, urine, test strip Check ketones per protocol 50 each 3  . BAYER MICROLET LANCETS lancets Check sugars 6 times daily and per protocol for hyper and hypoglycemia 250 each 3  . DULoxetine (CYMBALTA) 30 MG capsule Take 1 capsule (30 mg total) by mouth 2 (two) times daily. Due for follow up visit w/PCP 60 capsule 0  . glucagon (GLUCAGON EMERGENCY) 1 MG injection Inject 1 mg into the muscle once as needed (for hypoglycemia). 2 each 3  . glucose blood (ONETOUCH VERIO) test strip Checks blood sugar 6x daily 600 each 3  . Insulin Glargine (LANTUS SOLOSTAR) 100 UNIT/ML Solostar Pen Use up to 40 units daily. 15 pen 3  . insulin lispro (HUMALOG KWIKPEN) 100 UNIT/ML KwikPen  INJECT 10-15 UNITS UNDER THE SKIN FOUR TIMES DAILY WITH MEALS 39 mL 0  . Insulin Pen Needle (INSUPEN PEN NEEDLES) 32G X 4 MM MISC Inject insulin via insulin pen 6 x daily 600 each 4  . metFORMIN (GLUCOPHAGE) 500 MG tablet Take 2 tablets (1,000 mg total) by mouth daily with supper. 180 tablet 3  . promethazine (PHENERGAN) 25 MG tablet Take 1 tablet (25 mg total) by mouth every 8 (eight) hours as needed for nausea or vomiting. 30 tablet 3  . Urine Glucose-Ketones Test STRP Use prn for hyperglycemia 20 strip 0   No current facility-administered medications on file prior to visit.    Allergies  Allergen Reactions  . Levemir [Insulin  Detemir]     Hives at injection sites  . Augmentin [Amoxicillin-Pot Clavulanate]     Vomiting as young child  . Omnicef [Cefdinir]     Vomiting as young child.   Family History  Problem Relation Age of Onset  . Hypothyroidism Mother   . Cancer Mother        appendix cancer  . Diabetes Father        type 2  . Hypothyroidism Paternal Grandmother   . Diabetes Paternal Grandmother        type 2  . Diabetes Cousin        type 1    PE: BP 128/68   Pulse (!) 121   Ht 5' 6"  (1.676 m)   Wt 168 lb (76.2 kg)   SpO2 98%   BMI 27.12 kg/m   Wt Readings from Last 3 Encounters:  10/09/19 168 lb (76.2 kg)  06/07/19 150 lb (68 kg)  02/01/19 153 lb (69.4 kg)   Constitutional: Overweight t, in NAD Eyes: PERRLA, EOMI, no exophthalmos ENT: moist mucous membranes, no thyromegaly, no cervical lymphadenopathy Cardiovascular: Tachycardia RR, No MRG Respiratory: CTA B Gastrointestinal: abdomen soft, NT, ND, BS+ Musculoskeletal: no deformities, strength intact in all 4 Skin: moist, warm, no rashes Neurological: no tremor with outstretched hands, DTR normal in all 4  ASSESSMENT: 1. DM1, uncontrolled, without long-term complications, but with hyperglycemia  PLAN:  1. Patient with longstanding, uncontrolled, type 1 diabetes, on basal-bolus insulin regimen.   Unfortunately, since last visit, her social situation has changed.  She does not have a job, does not have a car and she lives with her friends.  Her father is helping her with groceries.  She could not get her Dexcom CGM after she changed her insurance.  As of now, she is checking her sugars with her glucometer. -Reviewing her sugars at home, they are higher and stay high throughout the day.  Unfortunately, she occasionally drops her sugars in the 60s in the middle of the night without a clear reason, sometimes because she boluses for more food that she would eat.  However, because of these low blood sugars at night, and especially now that if she is off the CGM, will need to be very careful with increasing her insulin doses. -Now that she has a new insurance, I suggested to get [place], which was not approved for her in the past.  This has a better safety profile especially in terms of low blood sugars and can be injected just once a day, compared to twice a day for Lantus.  However, I will give her a backup plan increasing Lantus in the morning in case she cannot get [place]. -I also advised her to try to correct with a little bit more insulin so hopefully she will stay lower throughout the day.   -If these measures will not bring her lower, we may need to strengthen her insulin to carb ratio, but for now I advised her to continue her 1:5 ratio.  It is a problem that she usually grazes throughout the day and has small meals while for this type of regimen, she needs to have clear, set, meals and not snack between them -She could not tolerate Metformin due to her GI issues -I suggested to: Patient Instructions  Please increase: - Lantus to 25 units in am and 20 units at night - Humalog: ICR 1:5 Target 100 ISF 25 >> 20  However, try to get [place] instead of Lantus and use  40 units daily, if covered by insurance.  Please return in 4 months.  - we checked her HbA1c: 9.1% (higher) - advised to  check sugars at different times of the day - 4x a day, rotating check times - advised for yearly eye exams >> she is not UTD - return to clinic in 4 months  - time spent with the patient: 25 min, of which >50% was spent in reviewing her blood sugars at home, discussing her hypo- and hyper-glycemic episodes, reviewing previous labs and insulin doses and developing a plan to avoid hypo- and hyper-glycemia.    Philemon Kingdom, MD PhD [facility] Endocrinology

## 2019-10-09 NOTE — Patient Instructions (Addendum)
Please increase: - Lantus to 25 units in am and 20 units at night - Humalog: ICR 1:5 Target 100 ISF 25 >> 20  However, try to get [place] instead of Lantus and use 40 units daily, if covered by insurance.  Please return in 4 months.

## 2019-11-21 ENCOUNTER — Telehealth: Payer: Self-pay

## 2019-11-21 NOTE — Telephone Encounter (Signed)
Pt's mom called and LVM stating that the pt has a wound on one of her toes. She states it looks pretty bad and that the pt cannot feel it. Pt is a DM Type 1. Pt is established with Dr. Cruzita Lederer for Endo. Tried to call pt's mom back and was not able to leave a voicemail.

## 2019-11-26 NOTE — Telephone Encounter (Signed)
Spoke with pt's mother Veronica Liu (on HIPPA/ROI form) who states that the pt was able to see her friend who is a wound care nurse and that she is taking care of the toe. She states she has another friend who is a dietician and is instructing the pt on changes in her diet to help with healing. Pt's mom said that the toe is improving greatly and that she does not need to be seen at this time. She did say that she wanted to go ahead and schedule her an OV to transfer care from [facility] to Dr. Sheppard Coil so I transferred the call to the front desk for scheduling.

## 2019-12-25 ENCOUNTER — Other Ambulatory Visit: Payer: Self-pay

## 2019-12-25 ENCOUNTER — Ambulatory Visit (INDEPENDENT_AMBULATORY_CARE_PROVIDER_SITE_OTHER): Payer: BC Managed Care – PPO | Admitting: Osteopathic Medicine

## 2019-12-25 ENCOUNTER — Encounter: Payer: Self-pay | Admitting: Osteopathic Medicine

## 2019-12-25 VITALS — BP 132/92 | HR 93 | Temp 98.3°F | Wt 162.1 lb

## 2019-12-25 DIAGNOSIS — F39 Unspecified mood [affective] disorder: Secondary | ICD-10-CM

## 2019-12-25 DIAGNOSIS — E1065 Type 1 diabetes mellitus with hyperglycemia: Secondary | ICD-10-CM

## 2019-12-25 DIAGNOSIS — F411 Generalized anxiety disorder: Secondary | ICD-10-CM

## 2019-12-25 DIAGNOSIS — R002 Palpitations: Secondary | ICD-10-CM | POA: Diagnosis not present

## 2019-12-25 DIAGNOSIS — E1040 Type 1 diabetes mellitus with diabetic neuropathy, unspecified: Secondary | ICD-10-CM | POA: Diagnosis not present

## 2019-12-25 MED ORDER — GABAPENTIN 100 MG PO CAPS: 100.0000 mg | ORAL_CAPSULE | Freq: Three times a day (TID) | ORAL | 0 refills | Status: DC

## 2019-12-25 MED ORDER — DULOXETINE HCL 30 MG PO CPEP: 30.0000 mg | ORAL_CAPSULE | Freq: Two times a day (BID) | ORAL | 0 refills | Status: DC

## 2019-12-25 NOTE — Progress Notes (Signed)
Veronica Liu is a 24 y.o. female who presents to  [place] [facility]  today, 12/25/19, seeking care for the following:  The primary encounter diagnosis was Generalized anxiety disorder. Diagnoses of Palpitations, Uncontrolled type 1 diabetes mellitus with hyperglycemia ([place]), Type 1 diabetes mellitus with diabetic neuropathy, unspecified ([place]), and Mood disorder ([place]) were also pertinent to this visit.  Depression is worse particularly through the pandemic   Patient reports a feeling of irregular heartbeat, episodes a few times per week, no discernible trigger, last 5 to 10 minutes, sits and puts her head between her legs and the situation resolves spontaneously.  Reports that this has happened a few times in the past but was never referred to specialist.  Concerned about neuropathic pain, legs and feet are particularly bothered, she would like to discuss medications that might help with this.   ASSESSMENT & PLAN with other pertinent history/findings:  Patient Instructions  Mental health  Will restart Cymbalta which should help with mental health as well as pain  May consider increasing Cymbalta, let's see how Gabapentin (as below) works   I encourage counseling! I'm glad you're set up with someone!  Palpitations  EKG is ok!   Will get labs, heart monitor, echocardiogram  If passing out, severe chest pain or trouble breathing, to ER!   Based on results, may consider referral to cardiology   Type 1 diabetes mellitus with diabetic neuropathy, and other chronic pain   Neuropathy - will start Gabapentin. Start at 100 mg three times per day and increase every few days to 200 mg three times per day and then to 300 mg three times per day.   Likely diabetic neuropathy, possibly component of fibromyalgia - treatment is same either way!   OK to take Ibuprofen 400-800 mg as needed as long as kidney function is ok on labs         EKG interpretation: Rate: 78 Rhythm: sinus No ST/T changes concerning for acute ischemia/infarct  Previous EKG 05/2013 no concerns     Orders Placed This Encounter  Procedures  . CBC  . COMPLETE METABOLIC PANEL WITH GFR  . TSH  . Magnesium  . Cardiac event monitor  . EKG 12-Lead  . ECHOCARDIOGRAM COMPLETE    Meds ordered this encounter  Medications  . gabapentin (NEURONTIN) 100 MG capsule    Sig: Take 1-3 capsules (100-300 mg total) by mouth 3 (three) times daily.    Dispense:  270 capsule    Refill:  0  . DULoxetine (CYMBALTA) 30 MG capsule    Sig: Take 1 capsule (30 mg total) by mouth 2 (two) times daily.    Dispense:  180 capsule    Refill:  0       Follow-up instructions: Return in about 4 weeks (around 01/22/2020) for RECHECK PAIN [place].       BP (!) 132/92 (BP Location: Left Arm, Patient Position: Sitting, Cuff Size: Normal)   Pulse 93   Temp 98.3 F (36.8 C) (Oral)   Wt 162 lb 1.9 oz (73.5 kg)   BMI 26.17 kg/m   No outpatient medications have been marked as taking for the 12/25/19 encounter (Appointment) with Emeterio Reeve, DO.    No results found for this or any previous visit (from the past 72 hour(s)).  No results found.  Depression screen [facility] 2/9 12/25/2019 10/18/2018 04/28/2018  Decreased Interest 3 2 2   Down, Depressed, Hopeless 3  2 2  PHQ - 2 Score 6 4 4   Altered sleeping 3 2 3   Tired, decreased energy 3 3 3   Change in appetite 3 3 3   Feeling bad or failure about yourself  3 1 3   Trouble concentrating 3 1 2   Moving slowly or fidgety/restless 1 0 1  Suicidal thoughts 2 1 1   PHQ-9 Score 24 15 20   Difficult doing work/chores Very difficult - -    GAD 7 : Generalized Anxiety Score 12/25/2019 10/18/2018 04/28/2018  Nervous, Anxious, on Edge 3 3 2   Control/stop worrying 3 3 2   Worry too much - different things 3 3 3   Trouble relaxing 2 2 1   Restless 2 1 2   Easily annoyed or irritable 1 1 1   Afraid -  awful might happen 3 2 1   Total GAD 7 Score 17 15 12   Anxiety Difficulty Extremely difficult - Very difficult      All questions at time of visit were answered - patient instructed to contact office with any additional concerns or updates.  ER/RTC precautions were reviewed with the patient.  Please note: voice recognition software was used to produce this document, and typos may escape review. Please contact Dr. for any needed clarifications.   Total encounter time: 40 minutes.

## 2019-12-25 NOTE — Patient Instructions (Addendum)
Mental health  Will restart Cymbalta which should help with mental health as well as pain  May consider increasing Cymbalta, let's see how Gabapentin (as below) works   I encourage counseling! I'm glad you're set up with someone!  Palpitations  EKG is ok!   Will get labs, heart monitor, echocardiogram  If passing out, severe chest pain or trouble breathing, to ER!   Based on results, may consider referral to cardiology   Type 1 diabetes mellitus with diabetic neuropathy, and other chronic pain   Neuropathy - will start Gabapentin. Start at 100 mg three times per day and increase every few days to 200 mg three times per day and then to 300 mg three times per day.   Likely diabetic neuropathy, possibly component of fibromyalgia - treatment is same either way!   OK to take Ibuprofen 400-800 mg as needed as long as kidney function is ok on labs

## 2020-01-14 ENCOUNTER — Other Ambulatory Visit: Payer: Self-pay

## 2020-01-14 DIAGNOSIS — IMO0002 Reserved for concepts with insufficient information to code with codable children: Secondary | ICD-10-CM

## 2020-01-14 DIAGNOSIS — E1065 Type 1 diabetes mellitus with hyperglycemia: Secondary | ICD-10-CM

## 2020-01-14 MED ORDER — LANTUS SOLOSTAR 100 UNIT/ML ~~LOC~~ SOPN: PEN_INJECTOR | SUBCUTANEOUS | 3 refills | Status: DC

## 2020-01-14 MED ORDER — ONETOUCH VERIO VI STRP: ORAL_STRIP | 11 refills | Status: AC

## 2020-01-14 MED ORDER — INSUPEN PEN NEEDLES 32G X 4 MM MISC: 11 refills | Status: DC

## 2020-01-22 ENCOUNTER — Ambulatory Visit: Payer: BC Managed Care – PPO | Admitting: Osteopathic Medicine

## 2020-01-23 ENCOUNTER — Other Ambulatory Visit: Payer: Self-pay

## 2020-01-23 ENCOUNTER — Ambulatory Visit (HOSPITAL_BASED_OUTPATIENT_CLINIC_OR_DEPARTMENT_OTHER)
Admission: RE | Admit: 2020-01-23 | Discharge: 2020-01-23 | Disposition: A | Discharge status: Home / Self Care | Payer: BC Managed Care – PPO | Source: Ambulatory Visit | Attending: Osteopathic Medicine | Admitting: Osteopathic Medicine

## 2020-01-23 DIAGNOSIS — R002 Palpitations: Secondary | ICD-10-CM | POA: Diagnosis not present

## 2020-01-23 NOTE — Progress Notes (Signed)
  Echocardiogram 2D Echocardiogram has been performed.  Sinda Du 01/23/2020, 1:47 PM

## 2020-02-07 ENCOUNTER — Ambulatory Visit: Payer: BC Managed Care – PPO | Admitting: Internal Medicine

## 2020-02-28 ENCOUNTER — Ambulatory Visit: Payer: BC Managed Care – PPO | Admitting: Internal Medicine

## 2020-03-12 DIAGNOSIS — F321 Major depressive disorder, single episode, moderate: Secondary | ICD-10-CM | POA: Diagnosis not present

## 2020-03-27 DIAGNOSIS — F332 Major depressive disorder, recurrent severe without psychotic features: Secondary | ICD-10-CM | POA: Diagnosis not present

## 2020-03-31 DIAGNOSIS — F332 Major depressive disorder, recurrent severe without psychotic features: Secondary | ICD-10-CM | POA: Diagnosis not present

## 2020-04-01 ENCOUNTER — Other Ambulatory Visit: Payer: Self-pay

## 2020-04-01 ENCOUNTER — Encounter: Payer: Self-pay | Admitting: Internal Medicine

## 2020-04-01 ENCOUNTER — Ambulatory Visit (INDEPENDENT_AMBULATORY_CARE_PROVIDER_SITE_OTHER): Payer: BC Managed Care – PPO | Admitting: Internal Medicine

## 2020-04-01 VITALS — BP 122/84 | HR 109 | Ht 66.0 in | Wt 176.0 lb

## 2020-04-01 DIAGNOSIS — E663 Overweight: Secondary | ICD-10-CM | POA: Diagnosis not present

## 2020-04-01 DIAGNOSIS — E1065 Type 1 diabetes mellitus with hyperglycemia: Secondary | ICD-10-CM

## 2020-04-01 LAB — POCT GLYCOSYLATED HEMOGLOBIN (HGB A1C): Hemoglobin A1C: 9 % — AB (ref 4.0–5.6)

## 2020-04-01 MED ORDER — GLUCAGON 3 MG/DOSE NA POWD: 3.0000 mg | Freq: Once | NASAL | 11 refills | Status: DC | PRN

## 2020-04-01 MED ORDER — TRESIBA FLEXTOUCH 200 UNIT/ML ~~LOC~~ SOPN: 40.0000 [IU] | PEN_INJECTOR | Freq: Every day | SUBCUTANEOUS | 5 refills | Status: DC

## 2020-04-01 NOTE — Progress Notes (Signed)
Patient ID: Veronica Liu, female   DOB: 08-28-1996, 24 y.o.   MRN: [id number]  This visit occurred during the SARS-CoV-2 public health emergency.  Safety protocols were in place, including screening questions prior to the visit, additional usage of staff PPE, and extensive cleaning of exam room while observing appropriate contact time as indicated for disinfecting solutions.   HPI: Veronica Liu is a 24 y.o.-year-old female, returning for follow-up for DM1, dx'ed at 24 y/o as DM2, and DM1 at 24 y/o, on insulin since 24 y/o uncontrolled, with long-term complications (PN, gastoparesis).  She was previously followed by Dr. Tobe Sos and then Annia Belt with pediatric endocrinology at [facility].  Last visit with me 5 months ago.  At last visit, sugars were higher after she broke up with her boyfriend of 3 years.  She was almost homeless at that time and could not take care of her diabetes if she wanted.  Also, she had another insurance and she could not get her Dexcom CGM.  However, she was getting her insulins without problems.  Since last visit, she had a hard time as she had to move from her best friend's house and had increased depression and was not able to pay much attention to her diabetes.  She had multiple missed insulin doses and suboptimal bolusing.  She was not able to start the CGM and she could not afford the co-pay.  Since then, she moved into her father's house and she has now established care with a psychiatrist.  She started medication 2 weeks ago.  She is hopeful that she will start feeling better.  She also hopes that she can get help for the co-pay for the CGM.  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  Reviewed HbA1c levels: Lab Results  Component Value Date   HGBA1C 9.1 (A) 10/09/2019   HGBA1C 9.0 (A) 06/07/2019   HGBA1C 9.1 (A) 02/01/2019  02/27/2018: HbA1c 9.4% 11/11/2017: HbA1c 8.8% 08/16/2017: HbA1c 8.9% 04/18/2017: HbA1c 9.3% 06/23/2016: HbA1c 12%  She was on the  following regimen: -Lantus 40 >> 34 units at bedtime >> 17 >> 20 units 2x a day >>  25 units in am and 20 units at night  -Humalog: ICR 1:6 >> 1:5 - 8-10 units Target 100 ISF 25 >> 20 We tried Metformin 1000 mg with dinner-added 06/2019 >> stopped after 2 to 3 days due to diarrhea.  Meter: One Touch Verio  She checks her sugars 1-3 times a day per review of her meter download: - am: 200-220 >> before brunch: 97, 299-396 - 2h after b'fast: n/c >> 217, 344 - lunch: 280-300 >> see above - 2h after lunch: n/c - dinner: low 200s >> n/c - 2h after dinner: n/c - bedtime: 220-230 (takes 5-6 units) >> 113, 254-123 - nighttime: can drop 2x a mo: 60s >> 64 x1 after increased activity (moving) Lowest sugar was 42 last week (during the night) >> 40-50 in am >> 60 >> 40-60 >> 64.  No previous hypoglycemia admissions.  She has a glucagon kit at home. Highest sugar was 475-HI ... >> 400 >> 440 >> 485.  She has a history of DKA admissions but not since last visit.  Stopped Dexcom 09/13/2019.  Previously:    Pt's meals are (she is very sensitive to discussions about her diet): - Breakfast: skips b/c gastroparesis (nausea) - prev. 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  She was out of work at last visit.Marland Kitchen  No CKD: Lab Results  Component Value Date   BUN 13 06/07/2019   BUN 14 03/05/2014   CREATININE 0.66 06/07/2019   CREATININE 0.55 03/05/2014   She has hyperlipidemia: Lab Results  Component Value Date   CHOL 221 (H) 06/07/2019   HDL 76 06/07/2019   LDLCALC 128 (H) 06/07/2019   TRIG 77 06/07/2019   CHOLHDL 2.9 06/07/2019  We did not start a statin but we discussed about increasing vegetables and fruit in her diet and also whole grains, while reducing fatty foods.  Previous labs:   - last eye exam was in 2018: No DR >> coming up next week -She has occasional numbness  and tingling in her feet.  Latest TSH was reviewed and this was normal: Lab Results  Component Value Date   TSH 1.37 06/07/2019   Pt has FH of DM2 in father and PGM.  Cousin with DM1.  She has a history of depression. She smokes half a pack a day.    ROS: Constitutional: no weight gain/no weight loss, no fatigue, no subjective hyperthermia, no subjective hypothermia Eyes: no blurry vision, no xerophthalmia ENT: no sore throat, no nodules palpated in neck, no dysphagia, no odynophagia, no hoarseness Cardiovascular: no CP/no SOB/no palpitations/no leg swelling Respiratory: no cough/no SOB/no wheezing Gastrointestinal: no N/no V/no D/no C/no acid reflux Musculoskeletal: no muscle aches/no joint aches Skin: no rashes, no hair loss Neurological: no tremors/+ numbness/+ tingling/no dizziness  I reviewed pt's medications, allergies, PMH, social hx, family hx, and changes were documented in the history of present illness. Otherwise, unchanged from my initial visit note.  Past Medical History:  Diagnosis Date  . Anxiety   . Depression   . Diabetes mellitus type I ([place])   . Diabetic gastroparesis ([place])   . GERD (gastroesophageal reflux disease)    Past Surgical History:  Procedure Laterality Date  . NO PAST SURGERIES     Social History   Socioeconomic History  . Marital status: Single    Spouse name: Not on file  . Number of children: 0  Occupational History  . N/a  Tobacco Use  . Smoking status: Current Every Day Smoker    Packs/day: 0.50    Years: 5.00    Pack years: 2.50    Types: Cigarettes  . Smokeless tobacco: Never Used  . Tobacco comment: NA  Substance and Sexual Activity  . Alcohol use: Never    Frequency: Never  . Drug use: Never  . Sexual activity: Yes    Birth control/protection: None, Condom  Social History Narrative   Lives with mom, step-dad, brother, and sister. Sees bio-dad twice monthly.    Current Outpatient Medications on File Prior to Visit    Medication Sig Dispense Refill  . acetone, urine, test strip Check ketones per protocol 50 each 3  . BAYER MICROLET LANCETS lancets Check sugars 6 times daily and per protocol for hyper and hypoglycemia 250 each 3  . Blood Glucose Monitoring Suppl (ONETOUCH VERIO) w/Device KIT Use to check blood sugar 4 times a day    . Continuous Blood Gluc Transmit (DEXCOM G6 TRANSMITTER) MISC 1 Device by Does not apply route every 3 (three) months. 1 each 3  . DULoxetine (CYMBALTA) 30 MG capsule Take 1 capsule (30 mg total) by mouth 2 (two) times daily. 180 capsule 0  . gabapentin (NEURONTIN) 100 MG capsule Take 1-3  capsules (100-300 mg total) by mouth 3 (three) times daily. 270 capsule 0  . glucagon (GLUCAGON EMERGENCY) 1 MG injection Inject 1 mg into the muscle once as needed (for hypoglycemia). 2 each 3  . glucose blood (ONETOUCH VERIO) test strip Checks blood sugar 4 times daily 400 each 11  . Insulin Degludec (TRESIBA FLEXTOUCH) 200 UNIT/ML SOPN Inject 40 Units into the skin daily. 6 pen 5  . Insulin Glargine (LANTUS SOLOSTAR) 100 UNIT/ML Solostar Pen Use up to 45 units daily. 15 pen 3  . insulin lispro (HUMALOG KWIKPEN) 100 UNIT/ML KwikPen INJECT up to 30 units a day under skin as advised 30 mL 3  . Insulin Pen Needle (INSUPEN PEN NEEDLES) 32G X 4 MM MISC Inject insulin via insulin pen 5 times daily 500 each 11  . promethazine (PHENERGAN) 25 MG tablet Take 1 tablet (25 mg total) by mouth every 8 (eight) hours as needed for nausea or vomiting. 30 tablet 3   No current facility-administered medications on file prior to visit.   Allergies  Allergen Reactions  . Levemir [Insulin Detemir]     Hives at injection sites  . Augmentin [Amoxicillin-Pot Clavulanate]     Vomiting as young child  . Omnicef [Cefdinir]     Vomiting as young child.   Family History  Problem Relation Age of Onset  . Hypothyroidism Mother   . Cancer Mother        appendix cancer  . Diabetes Father        type 2  .  Hypothyroidism Paternal Grandmother   . Diabetes Paternal Grandmother        type 2  . Diabetes Cousin        type 1    PE: BP 122/84 (BP Location: Left Arm, Patient Position: Sitting, Cuff Size: Normal)   Pulse (!) 109   Ht 5' 6"  (1.676 m)   Wt 176 lb (79.8 kg)   SpO2 98%   BMI 28.41 kg/m   Wt Readings from Last 3 Encounters:  04/01/20 176 lb (79.8 kg)  12/25/19 162 lb 1.9 oz (73.5 kg)  10/09/19 168 lb (76.2 kg)   Constitutional: overweight, in NAD Eyes: PERRLA, EOMI, no exophthalmos ENT: moist mucous membranes, no thyromegaly, no cervical lymphadenopathy Cardiovascular: RRR, No MRG Respiratory: CTA B Gastrointestinal: abdomen soft, NT, ND, BS+ Musculoskeletal: no deformities, strength intact in all 4 Skin: moist, warm, no rashes Neurological: no tremor with outstretched hands, DTR normal in all 4  ASSESSMENT: 1. DM1, uncontrolled, without long-term complications, but with hyperglycemia  2. Overweight  PLAN:  1. Patient with longstanding, uncontrolled, type 1 diabetes, on basal bolus insulin regimen.  At last visit, her social situation changed significantly: She did not have a job, did not have a car and she was living with her friends.  She could not get CGM after she changed her insurance.  Sugars were higher and they were staying high throughout the day.  She was also having low blood sugar sample of the night without a clear reason.  However, right before last visit, she started a new insurance and we discussed about restarting a CGM and I also suggested to try again to get [place], which was not approved for her in the past.  This is a better safety profile especially in terms of low blood sugars and can be injected just once a day, compared to twice a day for Lantus.  At that time I also advised her to relax her ISF so that  she did not drop her sugars with corrections.  She is grazing throughout the day and has small meals and we discussed that this is not ideal for  insulin boluses. -Of note, she could not tolerate Metformin due to GI issues -At this visit, she tells me so she did not start the CGM yet 2/2 high co-pay.  She feels that now she may have met her deductible and may be able to afford the CGM.  She will check on this.  I also again advised her to try to check on Tresiba coverage.  I did send this to her pharmacy. -her sugars are very high, with an occasional CBG at goal - and upon questioning, due to her depression, she was missing many insulin doses and doing automatic boluses without calculating the actual requirement.  She does started to be seen by a psychiatrist, started on medication.  She would prefer not to change the regimen so that she does not introduce a new stressor for now but try to improve her mental health for now which will most likely help her get her diabetes under control.  -Therefore, for now we will continue the current regimen but strongly advised her to check sugars, take her insulin as prescribed and work on her diet as she is starts feeling better -I suggested to: Patient Instructions  Please continue: - Lantus to 25 units in am and 20 units at night (try to get Tresiba 40 units daily) - Humalog: ICR 1:5 Target 100 ISF 20  Please return in 3 months.  - we checked her HbA1c: 9.0% (still high) - advised to check sugars at different times of the day - 4x a day, rotating check times - advised for yearly eye exams >> she is UTD - return to clinic in 3 months  2. Overweight - she gained 12 lbs since last OV - discussed about reducing snacks and switching to more whole rather than highly processed foods, including a majority of foods from veggies and fruit, grains, seeds, legumes  Philemon Kingdom, MD PhD [facility]

## 2020-04-01 NOTE — Patient Instructions (Addendum)
Please continue: - Lantus to 25 units in am and 20 units at night (try to get Tresiba 40 units daily) - Humalog: ICR 1:5 Target 100 ISF 20  Please return in 3 months.

## 2020-04-07 ENCOUNTER — Telehealth: Payer: Self-pay

## 2020-04-07 DIAGNOSIS — E109 Type 1 diabetes mellitus without complications: Secondary | ICD-10-CM | POA: Diagnosis not present

## 2020-04-07 NOTE — Telephone Encounter (Signed)
Opened in error

## 2020-04-10 ENCOUNTER — Encounter: Payer: Self-pay | Admitting: Internal Medicine

## 2020-04-10 DIAGNOSIS — E1042 Type 1 diabetes mellitus with diabetic polyneuropathy: Secondary | ICD-10-CM

## 2020-04-10 DIAGNOSIS — F332 Major depressive disorder, recurrent severe without psychotic features: Secondary | ICD-10-CM | POA: Diagnosis not present

## 2020-04-10 DIAGNOSIS — IMO0002 Reserved for concepts with insufficient information to code with codable children: Secondary | ICD-10-CM

## 2020-04-11 MED ORDER — DEXCOM G6 SENSOR MISC: 11 refills | Status: DC

## 2020-04-11 MED ORDER — INSULIN LISPRO (1 UNIT DIAL) 100 UNIT/ML (KWIKPEN): PEN_INJECTOR | SUBCUTANEOUS | 3 refills | Status: DC

## 2020-04-11 MED ORDER — DEXCOM G6 RECEIVER DEVI: 0 refills | Status: DC

## 2020-04-11 MED ORDER — DEXCOM G6 TRANSMITTER MISC: 4 refills | Status: DC

## 2020-04-15 ENCOUNTER — Other Ambulatory Visit: Payer: Self-pay

## 2020-04-15 MED ORDER — INSULIN LISPRO (0.5 UNIT DIAL) 100 UNIT/ML (KWIKPEN JR): 30.0000 [IU] | PEN_INJECTOR | Freq: Every day | SUBCUTANEOUS | 2 refills | Status: DC

## 2020-04-18 ENCOUNTER — Telehealth: Payer: Self-pay | Admitting: Internal Medicine

## 2020-04-18 MED ORDER — INSULIN LISPRO (0.5 UNIT DIAL) 100 UNIT/ML (KWIKPEN JR): 30.0000 [IU] | PEN_INJECTOR | Freq: Every day | SUBCUTANEOUS | 3 refills | Status: DC

## 2020-04-18 NOTE — Telephone Encounter (Signed)
Patient called re: Patient's insurance does not cover Humalog at [employer]. Patient requests a new RX with refills for insulin lispro (HUMALOG) 100 UNIT/ML KwikPen Junior be sent to [facility]. [facility] told patient that they heed approval from Dr. Elvera Lennox that the RX listed above be approved for home delivery.

## 2020-04-18 NOTE — Telephone Encounter (Signed)
[facility] failed to inform us of this issue so we had no knowledge.  RX sent to [facility].

## 2020-04-30 DIAGNOSIS — F321 Major depressive disorder, single episode, moderate: Secondary | ICD-10-CM | POA: Diagnosis not present

## 2020-05-08 DIAGNOSIS — F332 Major depressive disorder, recurrent severe without psychotic features: Secondary | ICD-10-CM | POA: Diagnosis not present

## 2020-06-05 DIAGNOSIS — F332 Major depressive disorder, recurrent severe without psychotic features: Secondary | ICD-10-CM | POA: Diagnosis not present

## 2020-06-23 DIAGNOSIS — F321 Major depressive disorder, single episode, moderate: Secondary | ICD-10-CM | POA: Diagnosis not present

## 2020-07-02 DIAGNOSIS — F332 Major depressive disorder, recurrent severe without psychotic features: Secondary | ICD-10-CM | POA: Diagnosis not present

## 2020-07-04 ENCOUNTER — Telehealth: Payer: Self-pay | Admitting: Internal Medicine

## 2020-07-04 ENCOUNTER — Other Ambulatory Visit: Payer: Self-pay

## 2020-07-04 DIAGNOSIS — E1065 Type 1 diabetes mellitus with hyperglycemia: Secondary | ICD-10-CM

## 2020-07-04 MED ORDER — DEXCOM G6 RECEIVER DEVI: 0 refills | Status: DC

## 2020-07-04 MED ORDER — DEXCOM G6 SENSOR MISC: 11 refills | Status: DC

## 2020-07-04 MED ORDER — DEXCOM G6 TRANSMITTER MISC: 4 refills | Status: DC

## 2020-07-04 NOTE — Telephone Encounter (Signed)
Patient called to advise that her insurance company will now cover the [facility] G6 for her and that she is requesting that a prescription be sent to [facility].

## 2020-07-04 NOTE — Telephone Encounter (Signed)
Please review and advise if you want to order. Uncertain if she meets criteria. Although they claim to cover device, insurance will still require PA and pt still must meet medical criteria.

## 2020-07-04 NOTE — Telephone Encounter (Signed)
Rx's have been sent per pt request to [facility]. However, if PA required, will submit to [facility] for completion of PA. Will await response from [facility].

## 2020-07-04 NOTE — Telephone Encounter (Signed)
Yes, please do PA

## 2020-07-30 DIAGNOSIS — F332 Major depressive disorder, recurrent severe without psychotic features: Secondary | ICD-10-CM | POA: Diagnosis not present

## 2020-07-31 ENCOUNTER — Ambulatory Visit: Payer: BC Managed Care – PPO | Admitting: Internal Medicine

## 2020-08-05 DIAGNOSIS — F332 Major depressive disorder, recurrent severe without psychotic features: Secondary | ICD-10-CM | POA: Diagnosis not present

## 2020-08-13 DIAGNOSIS — F332 Major depressive disorder, recurrent severe without psychotic features: Secondary | ICD-10-CM | POA: Diagnosis not present

## 2020-08-27 DIAGNOSIS — F332 Major depressive disorder, recurrent severe without psychotic features: Secondary | ICD-10-CM | POA: Diagnosis not present

## 2020-09-09 DIAGNOSIS — F332 Major depressive disorder, recurrent severe without psychotic features: Secondary | ICD-10-CM | POA: Diagnosis not present

## 2020-09-15 ENCOUNTER — Other Ambulatory Visit: Payer: Self-pay

## 2020-09-15 ENCOUNTER — Encounter: Payer: Self-pay | Admitting: Internal Medicine

## 2020-09-15 ENCOUNTER — Ambulatory Visit (INDEPENDENT_AMBULATORY_CARE_PROVIDER_SITE_OTHER): Payer: BC Managed Care – PPO | Admitting: Internal Medicine

## 2020-09-15 VITALS — BP 112/62 | HR 112 | Ht 66.0 in | Wt 175.2 lb

## 2020-09-15 DIAGNOSIS — IMO0002 Reserved for concepts with insufficient information to code with codable children: Secondary | ICD-10-CM

## 2020-09-15 DIAGNOSIS — E1042 Type 1 diabetes mellitus with diabetic polyneuropathy: Secondary | ICD-10-CM | POA: Diagnosis not present

## 2020-09-15 DIAGNOSIS — E663 Overweight: Secondary | ICD-10-CM

## 2020-09-15 DIAGNOSIS — E1065 Type 1 diabetes mellitus with hyperglycemia: Secondary | ICD-10-CM

## 2020-09-15 DIAGNOSIS — Z23 Encounter for immunization: Secondary | ICD-10-CM | POA: Diagnosis not present

## 2020-09-15 LAB — POCT GLYCOSYLATED HEMOGLOBIN (HGB A1C): Hemoglobin A1C: 8.1 % — AB (ref 4.0–5.6)

## 2020-09-15 MED ORDER — LYUMJEV KWIKPEN 100 UNIT/ML ~~LOC~~ SOPN: PEN_INJECTOR | SUBCUTANEOUS | 11 refills | Status: DC

## 2020-09-15 MED ORDER — DEXCOM G6 SENSOR MISC: 3 refills | Status: DC

## 2020-09-15 NOTE — Addendum Note (Signed)
Addended by: Carlyle Basques R on: 09/15/2020 02:50 PM   Modules accepted: Orders

## 2020-09-15 NOTE — Progress Notes (Addendum)
Patient ID: Veronica Liu, female   DOB: 12/05/1996, 24 y.o.   MRN: [id number]  This visit occurred during the SARS-CoV-2 public health emergency.  Safety protocols were in place, including screening questions prior to the visit, additional usage of staff PPE, and extensive cleaning of exam room while observing appropriate contact time as indicated for disinfecting solutions.   HPI: Veronica Liu is a 24 y.o.-year-old female, returning for follow-up for DM1, dx'ed at 24 y/o as DM2, and DM1 at 24 y/o, on insulin since 24 y/o uncontrolled, with long-term complications (PN, gastoparesis).  She was previously followed by Dr. Fransico Michael and then Rhys Martini with pediatric endocrinology at [facility].  Last visit with me 6 months ago  She has a reversed circadian rhythm, staying up during the night and waking up around 1-2 PM.  Reviewed HbA1c levels: Lab Results  Component Value Date   HGBA1C 9.0 (A) 04/01/2020   HGBA1C 9.1 (A) 10/09/2019   HGBA1C 9.0 (A) 06/07/2019   HGBA1C 9.1 (A) 02/01/2019   HGBA1C 9.2 (A) 05/19/2018   HGBA1C 8.7 08/05/2014   HGBA1C 9.9 03/05/2014   HGBA1C 8.0 09/06/2013   HGBA1C 10.8 06/19/2013   HGBA1C 12.5 (H) 06/04/2013   HGBA1C 14.8 (H) 01/17/2013  02/27/2018: HbA1c 9.4% 11/11/2017: HbA1c 8.8% 08/16/2017: HbA1c 8.9% 04/18/2017: HbA1c 9.3% 06/23/2016: HbA1c 12%  She was on the following regimen: -Lantus 40 >> 34 units at bedtime >> 17 >> 20 units 2x a day >>  25 units in am and 20 units at night >> (not covered) -Humalog: ICR 1:6 >> 1:5-8 to 10 units Target 100 ISF 25 >> 20 We tried Metformin 1000 mg with dinner-added 06/2019 >> stopped after 2 to 3 days due to diarrhea.  Meter: One Touch Verio  She checks her sugars more than 4 times a day with her Dexcom CGM: CGM parameters: - Average from CGM: 201+/-65 - Percentage time CGM active 74.9% - Coefficient of variation 32.2%  Time in range:  - very low (<54): 0% - low (<70): 0.4% - normal range (70-180):  38.8% - high sugars (>180): 60.9% - very high sugars (>250): 21.3%     Previously:  Lowest sugar was 40-60 >> 64.  No previous hypoglycemia admissions.  She has glucagon kit at home. Highest sugar was 485.  She has a history of DKA admissions but not since last visit   Pt's meals are (she is very sensitive to discussions about her diet): - Breakfast: skips b/c gastroparesis (nausea) - prev. Reglan, now phenergan - Lunch: sandwich + chips - Dinner: meat + veggies + bread - Snacks: cheese sticks, carrots  No CKD: Lab Results  Component Value Date   BUN 13 06/07/2019   BUN 14 03/05/2014   CREATININE 0.66 06/07/2019   CREATININE 0.55 03/05/2014   She has hyperlipidemia: Lab Results  Component Value Date   CHOL 221 (H) 06/07/2019   HDL 76 06/07/2019   LDLCALC 128 (H) 06/07/2019   TRIG 77 06/07/2019   CHOLHDL 2.9 06/07/2019  Discussed about improving diet at last visit but did not start a statin  - last eye exam was in 05/2020: No DR reportedly -+ Occasional numbness and tingling in her feet.  Latest TSH was normal: Lab Results  Component Value Date   TSH 1.37 06/07/2019   Pt has FH of DM2 in father and PGM.  Cousin with DM1.  She has a history of depression. She smokes half a pack a day.    ROS: Constitutional:  no weight gain/no weight loss, no fatigue, no subjective hyperthermia, no subjective hypothermia Eyes: no blurry vision, no xerophthalmia ENT: no sore throat, no nodules palpated in neck, no dysphagia, no odynophagia, no hoarseness Cardiovascular: no CP/no SOB/no palpitations/no leg swelling Respiratory: no cough/no SOB/no wheezing Gastrointestinal: no N/no V/no D/no C/no acid reflux Musculoskeletal: no muscle aches/no joint aches Skin: no rashes, no hair loss Neurological: no tremors/+ numbness/+ tingling/no dizziness  I reviewed pt's medications, allergies, PMH, social hx, family hx, and changes were documented in the history of present illness.  Otherwise, unchanged from my initial visit note.  Past Medical History:  Diagnosis Date  . Anxiety   . Depression   . Diabetes mellitus type I ([place])   . Diabetic gastroparesis ([place])   . GERD (gastroesophageal reflux disease)    Past Surgical History:  Procedure Laterality Date  . NO PAST SURGERIES     Social History   Socioeconomic History  . Marital status: Single    Spouse name: Not on file  . Number of children: 0  Occupational History  . N/a  Tobacco Use  . Smoking status: Current Every Day Smoker    Packs/day: 0.50    Years: 5.00    Pack years: 2.50    Types: Cigarettes  . Smokeless tobacco: Never Used  . Tobacco comment: NA  Substance and Sexual Activity  . Alcohol use: Never    Frequency: Never  . Drug use: Never  . Sexual activity: Yes    Birth control/protection: None, Condom  Social History Narrative   Lives with mom, step-dad, brother, and sister. Sees bio-dad twice monthly.    Current Outpatient Medications on File Prior to Visit  Medication Sig Dispense Refill  . acetone, urine, test strip Check ketones per protocol 50 each 3  . BAYER MICROLET LANCETS lancets Check sugars 6 times daily and per protocol for hyper and hypoglycemia 250 each 3  . Blood Glucose Monitoring Suppl (ONETOUCH VERIO) w/Device KIT Use to check blood sugar 4 times a day    . Continuous Blood Gluc Receiver (DEXCOM G6 RECEIVER) DEVI For continuous blood glucose monitoring 4 times a day; E10.42 1 each 0  . Continuous Blood Gluc Sensor (DEXCOM G6 SENSOR) MISC For continuous blood glucose monitoring 4 times a day; E10.42 3 each 11  . Continuous Blood Gluc Transmit (DEXCOM G6 TRANSMITTER) MISC For continuous blood glucose monitoring 4 times a day; E10.42 1 each 4  . DULoxetine (CYMBALTA) 30 MG capsule Take 1 capsule (30 mg total) by mouth 2 (two) times daily. 180 capsule 0  . gabapentin (NEURONTIN) 100 MG capsule Take 1-3 capsules (100-300 mg total) by mouth 3 (three) times daily. 270  capsule 0  . Glucagon 3 MG/DOSE POWD Place 3 mg into the nose once as needed for up to 1 dose. 1 each 11  . glucose blood (ONETOUCH VERIO) test strip Checks blood sugar 4 times daily 400 each 11  . insulin degludec (TRESIBA FLEXTOUCH) 200 UNIT/ML FlexTouch Pen Inject 40 Units into the skin daily. 6 pen 5  . Insulin Glargine (LANTUS SOLOSTAR) 100 UNIT/ML Solostar Pen Use up to 45 units daily. 15 pen 3  . insulin lispro (HUMALOG) 100 UNIT/ML KwikPen Junior Inject 0.3 mLs (30 Units total) into the skin daily. 27 mL 3  . Insulin Pen Needle (INSUPEN PEN NEEDLES) 32G X 4 MM MISC Inject insulin via insulin pen 5 times daily 500 each 11  . promethazine (PHENERGAN) 25 MG tablet Take 1 tablet (25 mg  total) by mouth every 8 (eight) hours as needed for nausea or vomiting. 30 tablet 3   No current facility-administered medications on file prior to visit.   Allergies  Allergen Reactions  . Levemir [Insulin Detemir]     Hives at injection sites  . Augmentin [Amoxicillin-Pot Clavulanate]     Vomiting as young child  . Omnicef [Cefdinir]     Vomiting as young child.   Family History  Problem Relation Age of Onset  . Hypothyroidism Mother   . Cancer Mother        appendix cancer  . Diabetes Father        type 2  . Hypothyroidism Paternal Grandmother   . Diabetes Paternal Grandmother        type 2  . Diabetes Cousin        type 1    PE: BP 112/62 (BP Location: Right Arm, Patient Position: Sitting)   Pulse (!) 112   Ht 5\' 6"  (1.676 m)   Wt 175 lb 3.2 oz (79.5 kg)   SpO2 98%   BMI 28.28 kg/m   Wt Readings from Last 3 Encounters:  09/15/20 175 lb 3.2 oz (79.5 kg)  04/01/20 176 lb (79.8 kg)  12/25/19 162 lb 1.9 oz (73.5 kg)   Constitutional: overweight, in NAD Eyes: PERRLA, EOMI, no exophthalmos ENT: moist mucous membranes, no thyromegaly, no cervical lymphadenopathy Cardiovascular: RRR, No MRG Respiratory: CTA B Gastrointestinal: abdomen soft, NT, ND, BS+ Musculoskeletal: no  deformities, strength intact in all 4 Skin: moist, warm, no rashes Neurological: no tremor with outstretched hands, DTR normal in all 4  ASSESSMENT: 1. DM1, uncontrolled, without long-term complications, but with hyperglycemia  2. Overweight  PLAN:  1. Patient with longstanding, uncontrolled, type 1 diabetes, on basal-bolus insulin regimen. -Of note, she could not tolerate Metformin due to GI issues. -At last visit, I suggested to switch from Lantus to 12/27/19 due to their effects on blood sugar variability on Tresiba. -At that time, sugars are very high, with an occasional CBG at goal, and upon questioning, due to her depression, she was missing many insulin doses and delaying automatic boluses without calculating the actual requirement.  Before last visit, she started to see a psychiatrist and was started on medication and she was starting to feel better.  We did not change her regimen other than changing to 01-09-1976, but I strongly advised her to start checking sugars, take her insulin as prescribed and work on her diet.  At this visit, she tells me that [place] was not covered by her insurance. -Since last visit, she decided to go back on the CGM (previously stopped due to cost) -we sent a prescription for the the Dexcom CGM to her pharmacy >> she was able to start this since last visit CGM interpretation: -At today's visit, we reviewed together her CGM tracings.  She has the same patterns compared to last visit, but with less variability.  Her sugars increased after she wakes up and eats breakfast, which is late in the day, around 4 PM for her.  They stay up during the entire night and start to decrease after 10 AM.  Upon questioning, she is injecting Humalog after meals, which is probably the reason why her sugars increased after the first meal of her day and remain elevated throughout the night (she has a reversed circadian rhythm).  At this visit, to help her with mealtime insulin  administration, I suggested to switch from Humalog to Lyumjev, which can be  injected at the start of the meal, then 15 minutes before.  However, I strongly advised her against injecting half of the meal.  She feels that this would be a much better insulin for her. -Overall, she has 38.8% of her values in target range, which is lower than our target of 70%, but improving.  She has no lows. -I suggested to: Patient Instructions  Please continue: - Lantus to 25 units in am and 20 units at night  Please try: - Humalog: ICR 1:5 >> 1:4 Target 100 ISF 20  Try to switch from Humalog to Lyumjev.  Please return in 3 months.  - we checked her HbA1c: 8.1% (best since 2014) - advised to check sugars at different times of the day - 3x a day, rotating check times - advised for yearly eye exams >> she is UTD - She would like to return for annual labs - return to clinic in 3 months  2. Overweight -She gained 12 pounds before last visit -At that time, we discussed about reducing snacks and switching to a more whole rather than highly processed foods, including the majority of foods from veggies and fruit, grains, seeds, legumes -Weight stable since last visit  Orders Placed This Encounter  Procedures  . COMPLETE METABOLIC PANEL WITH GFR  . Lipid panel  . Microalbumin / creatinine urine ratio  . TSH   Given flu shot today.  Philemon Kingdom, MD PhD [facility]

## 2020-09-15 NOTE — Patient Instructions (Addendum)
Please continue: - Lantus to 25 units in am and 20 units at night  Please try: - Humalog: ICR 1:5 >> 1:4 Target 100 ISF 20  Try to switch from Humalog to Lyumjev.  Please return in 3 months.

## 2020-09-19 ENCOUNTER — Other Ambulatory Visit: Payer: Self-pay

## 2020-09-22 ENCOUNTER — Other Ambulatory Visit: Payer: Self-pay

## 2020-09-22 MED ORDER — LYUMJEV KWIKPEN 100 UNIT/ML ~~LOC~~ SOPN: PEN_INJECTOR | SUBCUTANEOUS | 11 refills | Status: DC

## 2020-09-23 ENCOUNTER — Other Ambulatory Visit: Payer: Self-pay

## 2020-09-23 MED ORDER — LYUMJEV KWIKPEN 100 UNIT/ML ~~LOC~~ SOPN: PEN_INJECTOR | SUBCUTANEOUS | 11 refills | Status: DC

## 2020-09-24 DIAGNOSIS — F332 Major depressive disorder, recurrent severe without psychotic features: Secondary | ICD-10-CM | POA: Diagnosis not present

## 2020-09-29 DIAGNOSIS — F321 Major depressive disorder, single episode, moderate: Secondary | ICD-10-CM | POA: Diagnosis not present

## 2020-10-08 DIAGNOSIS — F332 Major depressive disorder, recurrent severe without psychotic features: Secondary | ICD-10-CM | POA: Diagnosis not present

## 2020-10-16 DIAGNOSIS — F321 Major depressive disorder, single episode, moderate: Secondary | ICD-10-CM | POA: Diagnosis not present

## 2020-10-22 DIAGNOSIS — F332 Major depressive disorder, recurrent severe without psychotic features: Secondary | ICD-10-CM | POA: Diagnosis not present

## 2020-10-28 DIAGNOSIS — F332 Major depressive disorder, recurrent severe without psychotic features: Secondary | ICD-10-CM | POA: Diagnosis not present

## 2020-11-10 ENCOUNTER — Other Ambulatory Visit: Payer: Self-pay

## 2020-11-10 ENCOUNTER — Other Ambulatory Visit (INDEPENDENT_AMBULATORY_CARE_PROVIDER_SITE_OTHER): Payer: BC Managed Care – PPO

## 2020-11-10 DIAGNOSIS — E1065 Type 1 diabetes mellitus with hyperglycemia: Secondary | ICD-10-CM | POA: Diagnosis not present

## 2020-11-10 DIAGNOSIS — E1042 Type 1 diabetes mellitus with diabetic polyneuropathy: Secondary | ICD-10-CM | POA: Diagnosis not present

## 2020-11-10 DIAGNOSIS — IMO0002 Reserved for concepts with insufficient information to code with codable children: Secondary | ICD-10-CM

## 2020-11-10 LAB — LIPID PANEL
Cholesterol: 239 mg/dL — ABNORMAL HIGH (ref 0–200)
HDL: 66.4 mg/dL (ref >39.00)
LDL Cholesterol: 155 mg/dL — ABNORMAL HIGH (ref 0–99)
NonHDL: 172.59
Total CHOL/HDL Ratio: 4
Triglycerides: 89 mg/dL (ref 0.0–149.0)
VLDL: 17.8 mg/dL (ref 0.0–40.0)

## 2020-11-10 LAB — TSH: TSH: 1.42 u[IU]/mL (ref 0.35–4.50)

## 2020-11-10 LAB — MICROALBUMIN / CREATININE URINE RATIO
Creatinine,U: 119.9 mg/dL
Microalb Creat Ratio: 1.1 mg/g (ref 0.0–30.0)
Microalb, Ur: 1.3 mg/dL (ref 0.0–1.9)

## 2020-11-11 ENCOUNTER — Ambulatory Visit (INDEPENDENT_AMBULATORY_CARE_PROVIDER_SITE_OTHER): Payer: BC Managed Care – PPO | Admitting: Osteopathic Medicine

## 2020-11-11 VITALS — BP 133/80 | HR 97 | Temp 98.4°F | Wt 175.1 lb

## 2020-11-11 DIAGNOSIS — F411 Generalized anxiety disorder: Secondary | ICD-10-CM | POA: Diagnosis not present

## 2020-11-11 DIAGNOSIS — E10649 Type 1 diabetes mellitus with hypoglycemia without coma: Secondary | ICD-10-CM

## 2020-11-11 DIAGNOSIS — G43809 Other migraine, not intractable, without status migrainosus: Secondary | ICD-10-CM

## 2020-11-11 DIAGNOSIS — E1040 Type 1 diabetes mellitus with diabetic neuropathy, unspecified: Secondary | ICD-10-CM | POA: Diagnosis not present

## 2020-11-11 DIAGNOSIS — F39 Unspecified mood [affective] disorder: Secondary | ICD-10-CM

## 2020-11-11 LAB — COMPLETE METABOLIC PANEL WITH GFR
AG Ratio: 1.7 (calc) (ref 1.0–2.5)
ALT: 22 U/L (ref 6–29)
AST: 17 U/L (ref 10–30)
Albumin: 4.6 g/dL (ref 3.6–5.1)
Alkaline phosphatase (APISO): 74 U/L (ref 31–125)
BUN: 15 mg/dL (ref 7–25)
CO2: 23 mmol/L (ref 20–32)
Calcium: 9.8 mg/dL (ref 8.6–10.2)
Chloride: 98 mmol/L (ref 98–110)
Creat: 0.86 mg/dL (ref 0.50–1.10)
GFR, Est African American: 110 mL/min/{1.73_m2} (ref >=60)
GFR, Est Non African American: 95 mL/min/{1.73_m2} (ref >=60)
Globulin: 2.7 g/dL (calc) (ref 1.9–3.7)
Glucose, Bld: 291 mg/dL — ABNORMAL HIGH (ref 65–99)
Potassium: 4.1 mmol/L (ref 3.5–5.3)
Sodium: 136 mmol/L (ref 135–146)
Total Bilirubin: 0.9 mg/dL (ref 0.2–1.2)
Total Protein: 7.3 g/dL (ref 6.1–8.1)

## 2020-11-11 MED ORDER — FROVATRIPTAN SUCCINATE 2.5 MG PO TABS: 2.5000 mg | ORAL_TABLET | ORAL | 0 refills | Status: DC | PRN

## 2020-11-11 NOTE — Progress Notes (Signed)
Veronica Liu is a 24 y.o. female who presents to  [place] at [facility]  today, 11/11/20, seeking care for the following:  Diabetes Type 1 - following with endocrinology. Hypoglycemic in office today, snacks provided and she rebounded nicely  Migraines - more frequent and not responding to [facility] Rx, no abortive medications tried Mental Health: GAD. MD - following w/ psychiatry. Recently started on Lamictal.      ASSESSMENT & PLAN with other pertinent findings:  The primary encounter diagnosis was Type 1 diabetes mellitus with diabetic neuropathy, unspecified ([place]). Diagnoses of Generalized anxiety disorder, Mood disorder ([place]), Hypoglycemia due to type 1 diabetes mellitus ([place]), and Other migraine without status migrainosus, not intractable were also pertinent to this visit.   Trial longer acting triptan, imitrex was minimally helpful and migraines recurred   May consider Depakote for migraine ppx as well as mood stabliizer? Will defer to psychiatry    Patient Instructions  Migraine: Will start Frova (frovatriptan) as needed Ask psychiatry to consider Depakote  May consider supplements w/  Magnesium Citrate 400-800 mg  Feverfew [place]        No orders of the defined types were placed in this encounter.   Meds ordered this encounter  Medications  . frovatriptan (FROVA) 2.5 MG tablet    Sig: Take 1 tablet (2.5 mg total) by mouth as needed for migraine. If recurs, may repeat after 2 hours. Max of 3 tabs in 24 hours.    Dispense:  10 tablet    Refill:  0    If PA needed, failed imitrex and other short-acting triptans       Follow-up instructions: Return for recheck on migraines 6 weeks.                                         BP 133/80 (BP Location: Left Arm, Patient Position: Sitting, Cuff Size: Normal)   Pulse 97   Temp 98.4 F (36.9 C) (Oral)   Wt 175 lb 1.9 oz (79.4 kg)    BMI 28.27 kg/m   Current Meds  Medication Sig  . acetone, urine, test strip Check ketones per protocol  . BAYER MICROLET LANCETS lancets Check sugars 6 times daily and per protocol for hyper and hypoglycemia  . Blood Glucose Monitoring Suppl (ONETOUCH VERIO) w/Device KIT Use to check blood sugar 4 times a day  . Continuous Blood Gluc Receiver ([place]) DEVI For continuous blood glucose monitoring 4 times a day; E10.42  . Continuous Blood Gluc Sensor (DEXCOM G6 SENSOR) MISC For continuous blood glucose monitoring 4 times a day; E10.42  . Continuous Blood Gluc Transmit (DEXCOM G6 TRANSMITTER) MISC For continuous blood glucose monitoring 4 times a day; E10.42  . DULoxetine (CYMBALTA) 30 MG capsule Take 1 capsule (30 mg total) by mouth 2 (two) times daily.  Marland Kitchen gabapentin (NEURONTIN) 100 MG capsule Take 1-3 capsules (100-300 mg total) by mouth 3 (three) times daily.  . Glucagon 3 MG/DOSE POWD Place 3 mg into the nose once as needed for up to 1 dose.  Marland Kitchen glucose blood (ONETOUCH VERIO) test strip Checks blood sugar 4 times daily  . Insulin Glargine (LANTUS SOLOSTAR) 100 UNIT/ML Solostar Pen Use up to 45 units daily.  . Insulin Lispro-aabc, 1 U Dial, (LYUMJEV KWIKPEN) 100 UNIT/ML SOPN Inject under kin 4-15 units before meals (3x a day)  .  Insulin Pen Needle (INSUPEN PEN NEEDLES) 32G X 4 MM MISC Inject insulin via insulin pen 5 times daily  . promethazine (PHENERGAN) 25 MG tablet Take 1 tablet (25 mg total) by mouth every 8 (eight) hours as needed for nausea or vomiting.     No results found.     All questions at time of visit were answered - patient instructed to contact office with any additional concerns or updates.  ER/RTC precautions were reviewed with the patient as applicable.   Please note: voice recognition software was used to produce this document, and typos may escape review. Please contact Dr. Sheppard Coil for any needed clarifications.

## 2020-11-11 NOTE — Patient Instructions (Addendum)
Migraine: Will start Frova (frovatriptan) as needed Ask psychiatry to consider Depakote  May consider supplements w/  Magnesium Citrate 400-800 mg  Feverfew [place]

## 2020-12-03 DIAGNOSIS — F332 Major depressive disorder, recurrent severe without psychotic features: Secondary | ICD-10-CM | POA: Diagnosis not present

## 2020-12-16 ENCOUNTER — Ambulatory Visit (INDEPENDENT_AMBULATORY_CARE_PROVIDER_SITE_OTHER): Payer: BC Managed Care – PPO | Admitting: Internal Medicine

## 2020-12-16 ENCOUNTER — Encounter: Payer: Self-pay | Admitting: Internal Medicine

## 2020-12-16 ENCOUNTER — Other Ambulatory Visit: Payer: Self-pay

## 2020-12-16 VITALS — BP 130/88 | HR 99 | Ht 66.0 in | Wt 184.4 lb

## 2020-12-16 DIAGNOSIS — E663 Overweight: Secondary | ICD-10-CM | POA: Diagnosis not present

## 2020-12-16 DIAGNOSIS — E1065 Type 1 diabetes mellitus with hyperglycemia: Secondary | ICD-10-CM | POA: Diagnosis not present

## 2020-12-16 DIAGNOSIS — E1042 Type 1 diabetes mellitus with diabetic polyneuropathy: Secondary | ICD-10-CM

## 2020-12-16 DIAGNOSIS — IMO0002 Reserved for concepts with insufficient information to code with codable children: Secondary | ICD-10-CM

## 2020-12-16 LAB — POCT GLYCOSYLATED HEMOGLOBIN (HGB A1C): Hemoglobin A1C: 8 % — AB (ref 4.0–5.6)

## 2020-12-16 MED ORDER — TRESIBA FLEXTOUCH 200 UNIT/ML ~~LOC~~ SOPN: 40.0000 [IU] | PEN_INJECTOR | Freq: Every day | SUBCUTANEOUS | 3 refills | Status: DC

## 2020-12-16 NOTE — Patient Instructions (Addendum)
Please continue: - Lyumjev: ICR 1:4 Target100 ISF20  Increase Lantus to 25 units 2x a day.  When you run out of Lantus, start Tresiba 40 units.  Please return in3-4 months.

## 2020-12-16 NOTE — Addendum Note (Signed)
Addended by: Kenyon Ana on: 12/16/2020 04:50 PM   Modules accepted: Orders

## 2020-12-16 NOTE — Progress Notes (Signed)
Patient ID: Veronica Liu, female   DOB: 10-05-1996, 25 y.o.   MRN: [id number]  This visit occurred during the SARS-CoV-2 public health emergency.  Safety protocols were in place, including screening questions prior to the visit, additional usage of staff PPE, and extensive cleaning of exam room while observing appropriate contact time as indicated for disinfecting solutions.   HPI: Veronica Liu is a 25 y.o.-year-old female, returning for follow-up for DM1, dx'ed at 25 y/o as DM2, and DM1 at 25 y/o, on insulin since 25 y/o uncontrolled, with long-term complications (PN, gastoparesis).  She was previously followed by Dr. Tobe Sos and then Annia Belt with pediatric endocrinology at [facility].  Last visit with me 3 mo ago.  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  Reviewed HbA1c levels: Lab Results  Component Value Date   HGBA1C 8.1 (A) 09/15/2020   HGBA1C 9.0 (A) 04/01/2020   HGBA1C 9.1 (A) 10/09/2019   HGBA1C 9.0 (A) 06/07/2019   HGBA1C 9.1 (A) 02/01/2019   HGBA1C 9.2 (A) 05/19/2018   HGBA1C 8.7 08/05/2014   HGBA1C 9.9 03/05/2014   HGBA1C 8.0 09/06/2013   HGBA1C 10.8 06/19/2013   HGBA1C 12.5 (H) 06/04/2013   HGBA1C 14.8 (H) 01/17/2013  02/27/2018: HbA1c 9.4% 11/11/2017: HbA1c 8.8% 08/16/2017: HbA1c 8.9% 04/18/2017: HbA1c 9.3% 06/23/2016: HbA1c 12%  She is on the following regimen: -Lantus 40 >> 34 units at bedtime >> 17 >> 20 units 2x a day >> 25 units in a.m. and 20 units at night>> (not covered) -Humalog >> Lyumjev: ICR 1:6 >> 1:5 >> 1:4 - 12-14 units Target 100 ISF 25 >> 20 We tried Metformin 1000 mg with dinner-added 06/2019 >> stopped due to diarrhea.  Meter: One Touch Verio  She checks her sugars more than 4 times a day with her Dexcom CGM: CGM parameters: - Average from CGM: 201+/-65 >> 194 - Percentage time CGM active 74.9% >> 100% - GMI: 7.9%  Time in range:  - very low (<54): 0% >> less than  1% - low (<70): 0.4% >> less than 1% - normal range (70-180): 38.8% >> 42% - high sugars (>180): 60.9% >> 32% - very high sugars (>250): 21.3% >> 25%    Previously:   Previously:  Lowest sugar was 40-60 >> 64 >> 45 at night.  No previous hypoglycemia admissions.  She has a glucagon kit at. Highest sugar was 485 >> 400s x1.  She has a history of DKA admissions but not since last visit.   Pt's meals are (she is very sensitive to discussions about her diet): - Breakfast: skips b/c gastroparesis (nausea) - prev. Reglan, now phenergan - Lunch: sandwich + chips - Dinner: meat + veggies + bread - Snacks: cheese sticks, carrots  No CKD: Lab Results  Component Value Date   BUN 15 11/10/2020   BUN 13 06/07/2019   CREATININE 0.86 11/10/2020   CREATININE 0.66 06/07/2019   + HL: Lab Results  Component Value Date   CHOL 239 (H) 11/10/2020   HDL 66.40 11/10/2020   LDLCALC 155 (H) 11/10/2020   TRIG 89.0 11/10/2020   CHOLHDL 4 11/10/2020  At last visit, we discussed about improving diet but did not start the statin.  - last eye exam was in 05/2020: No DR reportedly -+ Occasional numbness and tingling in her feet.  Latest TSH was normal: Lab Results  Component Value Date   TSH 1.42 11/10/2020   Pt has  FH of DM2 in father and PGM.  Cousin with DM1.  She has a history of depression. She smokes half a pack a day.    ROS: Constitutional: no weight gain/no weight loss, no fatigue, no subjective hyperthermia, no subjective hypothermia Eyes: no blurry vision, no xerophthalmia ENT: no sore throat, no nodules palpated in neck, no dysphagia, no odynophagia, no hoarseness Cardiovascular: no CP/no SOB/no palpitations/no leg swelling Respiratory: no cough/no SOB/no wheezing Gastrointestinal: no N/no V/no D/no C/no acid reflux Musculoskeletal: no muscle aches/no joint aches Skin: no rashes, no hair loss Neurological: no tremors/+ numbness/+ tingling/no dizziness  I reviewed pt's  medications, allergies, PMH, social hx, family hx, and changes were documented in the history of present illness. Otherwise, unchanged from my initial visit note.  Past Medical History:  Diagnosis Date  . Anxiety   . Depression   . Diabetes mellitus type I ([place])   . Diabetic gastroparesis ([place])   . GERD (gastroesophageal reflux disease)    Past Surgical History:  Procedure Laterality Date  . NO PAST SURGERIES     Social History   Socioeconomic History  . Marital status: Single    Spouse name: Not on file  . Number of children: 0  Occupational History  . N/a  Tobacco Use  . Smoking status: Current Every Day Smoker    Packs/day: 0.50    Years: 5.00    Pack years: 2.50    Types: Cigarettes  . Smokeless tobacco: Never Used  . Tobacco comment: NA  Substance and Sexual Activity  . Alcohol use: Never    Frequency: Never  . Drug use: Never  . Sexual activity: Yes    Birth control/protection: None, Condom  Social History Narrative   Lives with mom, step-dad, brother, and sister. Sees bio-dad twice monthly.    Current Outpatient Medications on File Prior to Visit  Medication Sig Dispense Refill  . acetone, urine, test strip Check ketones per protocol 50 each 3  . BAYER MICROLET LANCETS lancets Check sugars 6 times daily and per protocol for hyper and hypoglycemia 250 each 3  . Blood Glucose Monitoring Suppl (ONETOUCH VERIO) w/Device KIT Use to check blood sugar 4 times a day    . Continuous Blood Gluc Receiver (DEXCOM G6 RECEIVER) DEVI For continuous blood glucose monitoring 4 times a day; E10.42 1 each 0  . Continuous Blood Gluc Sensor (DEXCOM G6 SENSOR) MISC For continuous blood glucose monitoring 4 times a day; E10.42 9 each 3  . Continuous Blood Gluc Transmit (DEXCOM G6 TRANSMITTER) MISC For continuous blood glucose monitoring 4 times a day; E10.42 1 each 4  . DULoxetine (CYMBALTA) 30 MG capsule Take 1 capsule (30 mg total) by mouth 2 (two) times daily. 180 capsule 0  .  frovatriptan (FROVA) 2.5 MG tablet Take 1 tablet (2.5 mg total) by mouth as needed for migraine. If recurs, may repeat after 2 hours. Max of 3 tabs in 24 hours. 10 tablet 0  . gabapentin (NEURONTIN) 100 MG capsule Take 1-3 capsules (100-300 mg total) by mouth 3 (three) times daily. 270 capsule 0  . Glucagon 3 MG/DOSE POWD Place 3 mg into the nose once as needed for up to 1 dose. 1 each 11  . glucose blood (ONETOUCH VERIO) test strip Checks blood sugar 4 times daily 400 each 11  . Insulin Glargine (LANTUS SOLOSTAR) 100 UNIT/ML Solostar Pen Use up to 45 units daily. 15 pen 3  . Insulin Lispro-aabc, 1 U Dial, (LYUMJEV KWIKPEN) 100 UNIT/ML SOPN  Inject under kin 4-15 units before meals (3x a day) 15 mL 11  . Insulin Pen Needle (INSUPEN PEN NEEDLES) 32G X 4 MM MISC Inject insulin via insulin pen 5 times daily 500 each 11  . promethazine (PHENERGAN) 25 MG tablet Take 1 tablet (25 mg total) by mouth every 8 (eight) hours as needed for nausea or vomiting. 30 tablet 3   No current facility-administered medications on file prior to visit.   Allergies  Allergen Reactions  . Levemir [Insulin Detemir]     Hives at injection sites  . Augmentin [Amoxicillin-Pot Clavulanate]     Vomiting as young child  . Omnicef [Cefdinir]     Vomiting as young child.   Family History  Problem Relation Age of Onset  . Hypothyroidism Mother   . Cancer Mother        appendix cancer  . Diabetes Father        type 2  . Hypothyroidism Paternal Grandmother   . Diabetes Paternal Grandmother        type 2  . Diabetes Cousin        type 1    PE: BP 130/88   Pulse 99   Ht 5' 6"  (1.676 m)   Wt 184 lb 6.4 oz (83.6 kg)   SpO2 99%   BMI 29.76 kg/m   Wt Readings from Last 3 Encounters:  12/16/20 184 lb 6.4 oz (83.6 kg)  11/11/20 175 lb 1.9 oz (79.4 kg)  09/15/20 175 lb 3.2 oz (79.5 kg)   Constitutional: overweight, in NAD Eyes: PERRLA, EOMI, no exophthalmos ENT: moist mucous membranes, no thyromegaly, no cervical  lymphadenopathy Cardiovascular: tachycardia, RR, No MRG Respiratory: CTA B Gastrointestinal: abdomen soft, NT, ND, BS+ Musculoskeletal: no deformities, strength intact in all 4 Skin: moist, warm, no rashes Neurological: no tremor with outstretched hands, DTR normal in all 4  ASSESSMENT: 1. DM1, uncontrolled, without long-term complications, but with hyperglycemia  2. Overweight  PLAN:  1. Patient with longstanding, uncontrolled, type 1 diabetes, on basal-bolus insulin regimen, with improved control at last visit.  At that time, we strengthened her insulin to carb ratios as the sugars were still higher than target after meals.  Also, to help with insulin administration, we switch from Humalog to Lyumjev since this can be injected right at the start of the meal, rather than 15 minutes before, as for Humalog.  She did have a reverse circadian rhythm, which can influence her diabetes control, however, at last visit HbA1c was the best she had in 7 years, at 8.1%. CGM interpretation: -At today's visit, we reviewed her CGM downloads: It appears that 42% of values are in target range (goal >70%), while 57% are higher than 180 (goal <25%), and <1% are lower than 70 (goal <4%).  The calculated average blood sugar is 194.  The projected HbA1c for the next 3 months (GMI) is 7.9%. -Reviewing the CGM trends, it appears that her sugars remain high overnight (which is actually her awake window) and they are improved during the day (when she sleeps).  She is not snacking frequently during the night, and in fact she is only eating 1.5 meals a day.  She is taking the insulin consistently both for meals and for correction of high blood sugars.  She is injecting at the start of the meal, since we switched from Humalog to Lyumjev at last visit.  Since the sugars remain high overnight, we discussed about decreasing the dose of Lantus from 20 to 25  units around 8-9 PM.  However, her insurance is not covering Lantus anymore  and it appears to cover [place].  In this case, I suggested to switch to 40 units of Tresiba.  We discussed that if [place] Aas is not covered, if we need to switch to [facility], this is also once a day insulin, while, Basaglar to twice a day insulin, with the same dosing as Lantus. -For now, I would not suggest to make other changes, but she may need to use more Lyumjev if the high blood sugars persist overnight. -I suggested to: Patient Instructions  Please continue: - Lyumjev: ICR 1:4 Target100 ISF20  Increase Lantus to 25 units 2x a day.  When you run out of Lantus, start Tresiba 40 units.  Please return in3-4 months.  - we checked her HbA1c: 8.0% (slightly better) - advised to check sugars at different times of the day - 4x a day, rotating check times - advised for yearly eye exams >> she is UTD - return to clinic in 3-4 months  2. Overweight -In the past, we discussed about reducing snacks and switching to a more unprocessed food diet -Dieting discontinued by her reverse circadian rhythm -weight stable at last OV, now gained 9 lbs over the holidays  Philemon Kingdom, MD PhD [facility] Endocrinology

## 2020-12-29 DIAGNOSIS — F332 Major depressive disorder, recurrent severe without psychotic features: Secondary | ICD-10-CM | POA: Diagnosis not present

## 2021-01-07 DIAGNOSIS — F321 Major depressive disorder, single episode, moderate: Secondary | ICD-10-CM | POA: Diagnosis not present

## 2021-01-22 DIAGNOSIS — F321 Major depressive disorder, single episode, moderate: Secondary | ICD-10-CM | POA: Diagnosis not present

## 2021-01-26 DIAGNOSIS — F332 Major depressive disorder, recurrent severe without psychotic features: Secondary | ICD-10-CM | POA: Diagnosis not present

## 2021-02-10 ENCOUNTER — Telehealth: Payer: Self-pay | Admitting: Internal Medicine

## 2021-02-10 DIAGNOSIS — E1065 Type 1 diabetes mellitus with hyperglycemia: Secondary | ICD-10-CM

## 2021-02-10 DIAGNOSIS — IMO0002 Reserved for concepts with insufficient information to code with codable children: Secondary | ICD-10-CM

## 2021-02-10 DIAGNOSIS — F332 Major depressive disorder, recurrent severe without psychotic features: Secondary | ICD-10-CM | POA: Diagnosis not present

## 2021-02-10 NOTE — Telephone Encounter (Signed)
Pt called requesting to switch her tresiba medication to [facility] because her insurance will no longer cover tresiba.  MEDICATION: Semglee  PHARMACY:   [facility] [phone] - [place], [place] - [phone] AT [facility] [address] [place] Phone:  [phone]  Fax:  [phone]      HAS THE PATIENT CONTACTED THEIR PHARMACY?  yes  IS THIS A 90 DAY SUPPLY : no  IS PATIENT OUT OF MEDICATION: yes  IF NOT; HOW MUCH IS LEFT:   LAST APPOINTMENT DATE: @1 /10/2021  NEXT APPOINTMENT DATE:@4 /11/2021  DO WE HAVE YOUR PERMISSION TO LEAVE A DETAILED MESSAGE?:  OTHER COMMENTS:    **Let patient know to contact pharmacy at the end of the day to make sure medication is ready. **  ** Please notify patient to allow 48-72 hours to process**  **Encourage patient to contact the pharmacy for refills or they can request refills through [facility]**

## 2021-02-10 NOTE — Telephone Encounter (Signed)
Patient called back stating the [facility] is the one that's covered - she did not know there were two kinds. FYI

## 2021-02-11 NOTE — Telephone Encounter (Signed)
We can always use Semglee and Lantus interchangeably.  This is supposed to be done by the pharmacist, without input from the physician.Marland KitchenMarland Kitchen

## 2021-02-11 NOTE — Telephone Encounter (Signed)
Please Advise

## 2021-02-12 MED ORDER — INSULIN GLARGINE-YFGN 100 UNIT/ML ~~LOC~~ SOLN: 40.0000 [IU] | Freq: Every day | SUBCUTANEOUS | 1 refills | Status: DC

## 2021-02-12 NOTE — Telephone Encounter (Signed)
Rx sent to preferred pharmacy.

## 2021-02-16 ENCOUNTER — Telehealth: Payer: Self-pay | Admitting: Internal Medicine

## 2021-02-16 NOTE — Telephone Encounter (Signed)
REFILL RX FOR:  Insulin pen needles (90 day supply) - pharmacy ran out of fills  PHARMACY:  [facility] - [place], [place] - [address]  [address], [place] [address]  Phone:  [phone] Fax:  [phone]

## 2021-02-23 NOTE — Telephone Encounter (Signed)
Pt called because the pharmacy hasn't received her prescription yet and she was just checking on the status

## 2021-02-25 ENCOUNTER — Other Ambulatory Visit: Payer: Self-pay

## 2021-02-25 DIAGNOSIS — E1065 Type 1 diabetes mellitus with hyperglycemia: Secondary | ICD-10-CM

## 2021-02-25 DIAGNOSIS — IMO0002 Reserved for concepts with insufficient information to code with codable children: Secondary | ICD-10-CM

## 2021-02-25 MED ORDER — INSUPEN PEN NEEDLES 32G X 4 MM MISC: 11 refills | Status: DC

## 2021-03-17 ENCOUNTER — Ambulatory Visit (INDEPENDENT_AMBULATORY_CARE_PROVIDER_SITE_OTHER): Payer: BC Managed Care – PPO | Admitting: Internal Medicine

## 2021-03-17 ENCOUNTER — Encounter: Payer: Self-pay | Admitting: Internal Medicine

## 2021-03-17 ENCOUNTER — Other Ambulatory Visit: Payer: Self-pay

## 2021-03-17 VITALS — BP 128/80 | HR 112 | Ht 66.0 in | Wt 192.4 lb

## 2021-03-17 DIAGNOSIS — E663 Overweight: Secondary | ICD-10-CM | POA: Diagnosis not present

## 2021-03-17 DIAGNOSIS — E1042 Type 1 diabetes mellitus with diabetic polyneuropathy: Secondary | ICD-10-CM | POA: Diagnosis not present

## 2021-03-17 DIAGNOSIS — IMO0002 Reserved for concepts with insufficient information to code with codable children: Secondary | ICD-10-CM

## 2021-03-17 DIAGNOSIS — E1065 Type 1 diabetes mellitus with hyperglycemia: Secondary | ICD-10-CM

## 2021-03-17 LAB — POCT GLYCOSYLATED HEMOGLOBIN (HGB A1C): Hemoglobin A1C: 8.6 % — AB (ref 4.0–5.6)

## 2021-03-17 MED ORDER — INSUPEN PEN NEEDLES 32G X 4 MM MISC: 3 refills | Status: DC

## 2021-03-17 MED ORDER — GLUCAGON 3 MG/DOSE NA POWD: 3.0000 mg | Freq: Once | NASAL | 11 refills | Status: DC | PRN

## 2021-03-17 NOTE — Progress Notes (Signed)
Patient ID: Veronica Liu, female   DOB: 1996/02/23, 25 y.o.   MRN: [id number]  This visit occurred during the SARS-CoV-2 public health emergency.  Safety protocols were in place, including screening questions prior to the visit, additional usage of staff PPE, and extensive cleaning of exam room while observing appropriate contact time as indicated for disinfecting solutions.   HPI: Veronica Liu is a 25 y.o.-year-old female, returning for follow-up for DM1, dx'ed at 25 y/o as DM2, and DM1 at 25 y/o, on insulin since 25 y/o uncontrolled, with long-term complications (PN, gastoparesis).  She was previously followed by Dr. Tobe Sos and then Annia Belt with pediatric endocrinology at [facility].  Last visit with me 3 mo ago.  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 She sprained her ankle since last visit >> less active >> gained weight >> sugars higher. Now ankle is healed and plans to restart exercise Lamictal dose was increased in 01/2021.  Reviewed HbA1c levels: Lab Results  Component Value Date   HGBA1C 8.0 (A) 12/16/2020   HGBA1C 8.1 (A) 09/15/2020   HGBA1C 9.0 (A) 04/01/2020   HGBA1C 9.1 (A) 10/09/2019   HGBA1C 9.0 (A) 06/07/2019   HGBA1C 9.1 (A) 02/01/2019   HGBA1C 9.2 (A) 05/19/2018   HGBA1C 8.7 08/05/2014   HGBA1C 9.9 03/05/2014   HGBA1C 8.0 09/06/2013   HGBA1C 10.8 06/19/2013   HGBA1C 12.5 (H) 06/04/2013   HGBA1C 14.8 (H) 01/17/2013  02/27/2018: HbA1c 9.4% 11/11/2017: HbA1c 8.8% 08/16/2017: HbA1c 8.9% 04/18/2017: HbA1c 9.3% 06/23/2016: HbA1c 12%  She is on the following regimen: -Lantus 40 >> 34 units at bedtime >> 17 >> 25 units 2x a day >> (not covered) >> Semglee 25 units 2x a day -Humalog >> Lyumjev: ICR  (not actually using an ICR) - 12-14 units -before her dinner and 6-8 units before the other meals Target 100 ISF 25 >> 20 We tried Metformin 1000 mg with dinner-added 06/2019 >>  stopped due to diarrhea.  Meter: One Touch Verio  She checks her sugars more than 4 times a day with her Dexcom CGM: CGM parameters: - Average from CGM: 201+/-65 >> 194 >> 203 - Percentage time CGM active 74.9% >> 100% >> 68.4% - GMI: 7.9% >> n/c  Time in range:  - very low (<54): 0% >> less than 1% >> 0% - low (<70): 0.4% >> less than 1% >> 1% - normal range (70-180): 38.8% >> 42% >> 37.8% - high sugars (>180): 60.9% >> 32% >> 51.2% - very high sugars (>250): 21.3% >> 25% >> 28.3%   Previously:   Lowest sugar was 40-60 >> 64 >> 45 at night >> 54.  No previous hypoglycemia admissions.  She does not have a glucagon kit at home as she did not take it from the pharmacy. Highest sugar was 485 >> 400s x1 (sensor malfxn), OTW 300s.  She has a history of DKA admissions but not since last visit.   Pt's meals are: - Breakfast: skips b/c gastroparesis (nausea) - prev. Reglan, now phenergan - Lunch: sandwich + chips - Dinner: meat + veggies + bread - Snacks: cheese sticks, carrots  No CKD: Lab Results  Component Value Date   BUN 15 11/10/2020   BUN 13 06/07/2019   CREATININE 0.86 11/10/2020   CREATININE 0.66 06/07/2019   + HL: Lab Results  Component Value Date   CHOL 239 (H) 11/10/2020   HDL 66.40 11/10/2020  [place] 155 (H) 11/10/2020   TRIG 89.0 11/10/2020   CHOLHDL 4 11/10/2020  At last visit, we discussed about improving diet but did not start the statin.  - last eye exam was in 05/2020: No DR reportedly -+ Occasional numbness and tingling in her feet.  Latest TSH was normal: Lab Results  Component Value Date   TSH 1.42 11/10/2020   Pt has FH of DM2 in father and PGM.  Cousin with DM1.  She has a history of depression. She smokes half a pack a day.    ROS: Constitutional: + weight gain/no weight loss, no fatigue, no subjective hyperthermia, no subjective hypothermia Eyes: no blurry vision, no xerophthalmia ENT: no sore throat, no nodules palpated in neck, no  dysphagia, no odynophagia, no hoarseness Cardiovascular: no CP/no SOB/no palpitations/no leg swelling Respiratory: no cough/no SOB/no wheezing Gastrointestinal: no N/no V/no D/no C/no acid reflux Musculoskeletal: no muscle aches/+ joint aches Skin: no rashes, no hair loss Neurological: no tremors/+ numbness/+ tingling/no dizziness  I reviewed pt's medications, allergies, PMH, social hx, family hx, and changes were documented in the history of present illness. Otherwise, unchanged from my initial visit note.  Past Medical History:  Diagnosis Date  . Anxiety   . Depression   . Diabetes mellitus type I ([place])   . Diabetic gastroparesis ([place])   . GERD (gastroesophageal reflux disease)    Past Surgical History:  Procedure Laterality Date  . NO PAST SURGERIES     Social History   Socioeconomic History  . Marital status: Single    Spouse name: Not on file  . Number of children: 0  Occupational History  . N/a  Tobacco Use  . Smoking status: Current Every Day Smoker    Packs/day: 0.50    Years: 5.00    Pack years: 2.50    Types: Cigarettes  . Smokeless tobacco: Never Used  . Tobacco comment: NA  Substance and Sexual Activity  . Alcohol use: Never    Frequency: Never  . Drug use: Never  . Sexual activity: Yes    Birth control/protection: None, Condom  Social History Narrative   Lives with mom, step-dad, brother, and sister. Sees bio-dad twice monthly.    Current Outpatient Medications on File Prior to Visit  Medication Sig Dispense Refill  . acetone, urine, test strip Check ketones per protocol 50 each 3  . BAYER MICROLET LANCETS lancets Check sugars 6 times daily and per protocol for hyper and hypoglycemia 250 each 3  . Blood Glucose Monitoring Suppl (ONETOUCH VERIO) w/Device KIT Use to check blood sugar 4 times a day    . Continuous Blood Gluc Receiver (DEXCOM G6 RECEIVER) DEVI For continuous blood glucose monitoring 4 times a day; E10.42 1 each 0  . Continuous Blood Gluc  Sensor (DEXCOM G6 SENSOR) MISC For continuous blood glucose monitoring 4 times a day; E10.42 9 each 3  . Continuous Blood Gluc Transmit (DEXCOM G6 TRANSMITTER) MISC For continuous blood glucose monitoring 4 times a day; E10.42 1 each 4  . frovatriptan (FROVA) 2.5 MG tablet Take 1 tablet (2.5 mg total) by mouth as needed for migraine. If recurs, may repeat after 2 hours. Max of 3 tabs in 24 hours. 10 tablet 0  . Glucagon 3 MG/DOSE POWD Place 3 mg into the nose once as needed for up to 1 dose. 1 each 11  . glucose blood (ONETOUCH VERIO) test strip Checks blood sugar 4 times daily 400 each 11  . Insulin Glargine (LANTUS SOLOSTAR) 100  UNIT/ML Solostar Pen Use up to 45 units daily. 15 pen 3  . Insulin Glargine-yfgn (SEMGLEE, YFGN,) 100 UNIT/ML SOLN Inject 40 Units into the skin daily. 15 mL 1  . Insulin Lispro-aabc, 1 U Dial, (LYUMJEV KWIKPEN) 100 UNIT/ML SOPN Inject under kin 4-15 units before meals (3x a day) 15 mL 11  . Insulin Pen Needle (INSUPEN PEN NEEDLES) 32G X 4 MM MISC Inject insulin via insulin pen 5 times daily 500 each 11  . lamoTRIgine (LAMICTAL) 150 MG tablet Take 150 mg by mouth daily.    . promethazine (PHENERGAN) 25 MG tablet Take 1 tablet (25 mg total) by mouth every 8 (eight) hours as needed for nausea or vomiting. 30 tablet 3   No current facility-administered medications on file prior to visit.   Allergies  Allergen Reactions  . Levemir [Insulin Detemir]     Hives at injection sites  . Augmentin [Amoxicillin-Pot Clavulanate]     Vomiting as young child  . Omnicef [Cefdinir]     Vomiting as young child.   Family History  Problem Relation Age of Onset  . Hypothyroidism Mother   . Cancer Mother        appendix cancer  . Diabetes Father        type 2  . Hypothyroidism Paternal Grandmother   . Diabetes Paternal Grandmother        type 2  . Diabetes Cousin        type 1    PE: BP 128/80 (BP Location: Right Arm, Patient Position: Sitting, Cuff Size: Normal)   Pulse  (!) 112   Ht 5' 6"  (1.676 m)   Wt 192 lb 6.4 oz (87.3 kg)   SpO2 97%   BMI 31.05 kg/m   Wt Readings from Last 3 Encounters:  03/17/21 192 lb 6.4 oz (87.3 kg)  12/16/20 184 lb 6.4 oz (83.6 kg)  11/11/20 175 lb 1.9 oz (79.4 kg)   Constitutional: overweight, in NAD Eyes: PERRLA, EOMI, no exophthalmos ENT: moist mucous membranes, no thyromegaly, no cervical lymphadenopathy Cardiovascular: tachycardia, RR, No MRG Respiratory: CTA B Gastrointestinal: abdomen soft, NT, ND, BS+ Musculoskeletal: no deformities, strength intact in all 4 Skin: moist, warm, no rashes Neurological: no tremor with outstretched hands, DTR normal in all 4  ASSESSMENT: 1. DM1, uncontrolled, without long-term complications, but with hyperglycemia  2. Overweight  PLAN:  1. Patient with longstanding, uncontrolled, type 1 diabetes, on basal-bolus insulin regimen, with improving control.  At last visit, HbA1c was slightly better, at 8.0%.  At that time, reviewing her CGM downloads, sugars are still high overnight, which is actually her awake window and they were improved during the day, when she was sleeping.  She was not snacking frequently during the night, in fact, she was eating 1.5 meals a day.  She was taking insulin consistently both for meals and for correction of high blood sugars, injecting Lyumjev at the start of the meal.  We did increase the Lantus dose at night but she was telling me that her insurance did not cover Lantus anymore and I suggested to change to [place].  Insurance did not cover [place] Aas so she is now on [place]. CGM interpretation: -At today's visit, we reviewed her CGM downloads: It appears that 37.8% of values are in target range (goal >70%), while 61.2% are higher than 180 (goal <25%), and 1% are lower than 70 (goal <4%).  The calculated average blood sugar is 203. -Reviewing the CGM trends, it appears that her sugars improve  while she is sleeping during the day, but then increased with a  slope of almost 90 degrees after dinner.  They remain high throughout the night and they start to decrease around 11 PM.  She has a nadir of blood sugars around 6 PM, before her first meal of the day.  She occasionally sees low blood sugars around this time.  Based on this patterns, I advised her that she needs to inject more Lyumjev before the first meal of the day.  She currently does not use an insulin to carb ratio, but estimates the boluses based on previous experience.  I advised her that this is ok, but she needs to increase the blood sugars based on CGM patterns, also.  Therefore, I advised her to increase the insulin before dinner to 16-22 units.  For the other meals, she can continue the 6 to 8 units boluses.  Since her sugars are dropping when she is sleepy during the day to even 50s or 60s, I advised her to decrease the dose of the basal insulin that she is taking in the morning and we will increase the dose at night to help with sugars overnight -I suggested to: Patient Instructions  Please change: - Semglee   30 at 10 pm  20 at 10 am - Lyumjev: 16-22 units before dinner 6-8 units before the rest of the meals Continue the same sliding scale: Target100 ISF20  Please return in4 months.  - we checked her HbA1c: 8.6% (higher) - advised to check sugars at different times of the day - 4x a day, rotating check times - advised for yearly eye exams >> she is UTD - I refilled her glucagon and pen needles - return to clinic in 4 months  2. Overweight -At last visit, she gained 9 pounds over the holidays. -At that time, we discussed about the importance of improving diet. Her dieting is hindered also by her reversed circadian rhythm and also by the fact that she is only eating 1 meal a day (dinner, around 5-6 PM).  We did discuss that she ideally would spread the meals throughout the day.  Philemon Kingdom, MD PhD [facility] Endocrinology

## 2021-03-17 NOTE — Patient Instructions (Addendum)
Please change: - Semglee   30 at 10 pm  20 at 10 am - Lyumjev: 16-22 units before dinner 6-8 units before the rest of the meals Continue the same sliding scale: Target100 ISF20  Please return in4 months.

## 2021-04-23 ENCOUNTER — Telehealth: Payer: Self-pay | Admitting: Internal Medicine

## 2021-04-23 ENCOUNTER — Other Ambulatory Visit: Payer: Self-pay | Admitting: *Deleted

## 2021-04-23 MED ORDER — INSULIN GLARGINE-YFGN 100 UNIT/ML ~~LOC~~ SOLN: 40.0000 [IU] | Freq: Every day | SUBCUTANEOUS | 1 refills | Status: DC

## 2021-04-23 NOTE — Telephone Encounter (Signed)
[facility]

## 2021-04-23 NOTE — Telephone Encounter (Signed)
Patient called to check on change of pharmacy for [facility] .  Needs RX sent to [facility] for 90 day fill. [facility] was supposed to be requesting refill @ 1 week ago but I do not see a request

## 2021-04-30 DIAGNOSIS — F332 Major depressive disorder, recurrent severe without psychotic features: Secondary | ICD-10-CM | POA: Diagnosis not present

## 2021-05-28 DIAGNOSIS — F332 Major depressive disorder, recurrent severe without psychotic features: Secondary | ICD-10-CM | POA: Diagnosis not present

## 2021-06-25 DIAGNOSIS — F332 Major depressive disorder, recurrent severe without psychotic features: Secondary | ICD-10-CM | POA: Diagnosis not present

## 2021-07-28 ENCOUNTER — Other Ambulatory Visit: Payer: Self-pay

## 2021-07-28 ENCOUNTER — Ambulatory Visit (INDEPENDENT_AMBULATORY_CARE_PROVIDER_SITE_OTHER): Payer: BC Managed Care – PPO | Admitting: Internal Medicine

## 2021-07-28 ENCOUNTER — Encounter: Payer: Self-pay | Admitting: Internal Medicine

## 2021-07-28 VITALS — BP 120/70 | HR 113 | Ht 66.0 in | Wt 190.6 lb

## 2021-07-28 DIAGNOSIS — E663 Overweight: Secondary | ICD-10-CM

## 2021-07-28 DIAGNOSIS — E1042 Type 1 diabetes mellitus with diabetic polyneuropathy: Secondary | ICD-10-CM | POA: Diagnosis not present

## 2021-07-28 DIAGNOSIS — E1065 Type 1 diabetes mellitus with hyperglycemia: Secondary | ICD-10-CM

## 2021-07-28 DIAGNOSIS — IMO0002 Reserved for concepts with insufficient information to code with codable children: Secondary | ICD-10-CM

## 2021-07-28 LAB — POCT GLYCOSYLATED HEMOGLOBIN (HGB A1C): Hemoglobin A1C: 8 % — AB (ref 4.0–5.6)

## 2021-07-28 NOTE — Patient Instructions (Addendum)
Please continue: - Semglee              30 units at 10 pm             20 units at 10 am  Change: - Lyumjev: 18-24 units before dinner 10-12 units before lunch  Continue the same sliding scale: Target 100 ISF 20   Please return in 4 months.

## 2021-07-28 NOTE — Progress Notes (Signed)
Patient ID: Veronica Liu, female   DOB: 1996/05/15, 25 y.o.   MRN: [id number]  This visit occurred during the SARS-CoV-2 public health emergency.  Safety protocols were in place, including screening questions prior to the visit, additional usage of staff PPE, and extensive cleaning of exam room while observing appropriate contact time as indicated for disinfecting solutions.   HPI: Veronica Liu is a 25 y.o.-year-old female, returning for follow-up for DM1, dx'ed at 25 y/o as DM2, and DM1 at 25 y/o, on insulin since 25 y/o uncontrolled, with long-term complications (PN, gastoparesis).  She was previously followed by Dr. Tobe Sos and then Annia Belt with pediatric endocrinology at [facility].  Last visit with 4 months ago.  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 Before last visit she sprained he L ankle and was less active so she gained weight.  At that time, she was planning to restart exercising. She did not start yet.  She lost some weight since last visit, but then gained it back. She has more numbness and tingling in feet L>R. On Lamictal, increasing the dose towards 200 mg daily, currently on 150 mg daily.  Reviewed HbA1c levels: Lab Results  Component Value Date   HGBA1C 8.6 (A) 03/17/2021   HGBA1C 8.0 (A) 12/16/2020   HGBA1C 8.1 (A) 09/15/2020   HGBA1C 9.0 (A) 04/01/2020   HGBA1C 9.1 (A) 10/09/2019   HGBA1C 9.0 (A) 06/07/2019   HGBA1C 9.1 (A) 02/01/2019   HGBA1C 9.2 (A) 05/19/2018   HGBA1C 8.7 08/05/2014   HGBA1C 9.9 03/05/2014   HGBA1C 8.0 09/06/2013   HGBA1C 10.8 06/19/2013   HGBA1C 12.5 (H) 06/04/2013   HGBA1C 14.8 (H) 01/17/2013  02/27/2018: HbA1c 9.4% 11/11/2017: HbA1c 8.8% 08/16/2017: HbA1c 8.9% 04/18/2017: HbA1c 9.3% 06/23/2016: HbA1c 12%  Previously on: -Lantus 40 >> 34 units at bedtime >> 17 >> 25 units 2x a day >> Tresiba 40 units daily (not covered) >> Semglee 25 units 2x a  day -Humalog >> Lyumjev: ICR  (not actually using an ICR) - 12-14 units -before her dinner and 6-8 units before the other meals Target 100 ISF 25 >> 20 We tried Metformin 1000 mg with dinner-added 06/2019 >> stopped due to diarrhea.  At last visit we changed her regimen to: - Semglee   30 units at 10 pm  20 units at 10 am - Lyumjev: 16-22 units before dinner 6-8 units before the rest of the meals Continue the same sliding scale: Target 100 ISF 20  She checks her sugars more than 4 times a day with her Dexcom CGM:   Previously:   Lowest sugar was 40-60 >> 64 >> 45 at night >> 54 >> 60.  No previous hypoglycemia admissions.  She does have a glucagon kit at home. Highest sugar was 485 >> 400s x1 (sensor malfxn), OTW 300s >> 300.  She has a history of DKA admissions but not since last visit.  She also has a [employer].  Pt's meals are: - Breakfast: skips b/c gastroparesis (nausea) - prev. Reglan, now phenergan - Lunch: sandwich + chips - Dinner: meat + veggies + bread - Snacks: cheese sticks, carrots  No CKD: Lab Results  Component Value Date   BUN 15 11/10/2020   BUN 13 06/07/2019   CREATININE 0.86 11/10/2020   CREATININE 0.66 06/07/2019   + HL: Lab Results  Component Value Date   CHOL 239 (H) 11/10/2020  HDL 66.40 11/10/2020   LDLCALC 155 (H) 11/10/2020   TRIG 89.0 11/10/2020   CHOLHDL 4 11/10/2020  At last visit, we discussed about improving diet but did not start the statin.  - last eye exam was in 05/2020: No DR reportedly  -+ Occasional numbness and tingling in her feet.  Latest TSH was normal: Lab Results  Component Value Date   TSH 1.42 11/10/2020   Pt has FH of DM2 in father and PGM.  Cousin with DM1.  She has a history of depression. She smokes half a pack a day.    ROS: Constitutional: + weight gain/+ weight loss, no fatigue, no subjective hyperthermia, no subjective hypothermia Eyes: no blurry vision, no xerophthalmia ENT:  no sore throat, no nodules palpated in neck, no dysphagia, no odynophagia, no hoarseness Cardiovascular: no CP/no SOB/no palpitations/no leg swelling Respiratory: no cough/no SOB/no wheezing Gastrointestinal: no N/no V/no D/no C/no acid reflux Musculoskeletal: no muscle aches/+ joint aches Skin: no rashes, no hair loss Neurological: no tremors/+ numbness/+ tingling/no dizziness  I reviewed pt's medications, allergies, PMH, social hx, family hx, and changes were documented in the history of present illness. Otherwise, unchanged from my initial visit note.  Past Medical History:  Diagnosis Date   Anxiety    Depression    Diabetes mellitus type I ([place])    Diabetic gastroparesis ([place])    GERD (gastroesophageal reflux disease)    Past Surgical History:  Procedure Laterality Date   NO PAST SURGERIES     Social History   Socioeconomic History   Marital status: Single    Spouse name: Not on file   Number of children: 0  Occupational History   N/a  Tobacco Use   Smoking status: Current Every Day Smoker    Packs/day: 0.50    Years: 5.00    Pack years: 2.50    Types: Cigarettes   Smokeless tobacco: Never Used   Tobacco comment: NA  Substance and Sexual Activity   Alcohol use: Never    Frequency: Never   Drug use: Never   Sexual activity: Yes    Birth control/protection: None, Condom  Social History Narrative   Lives with mom, step-dad, brother, and sister. Sees bio-dad twice monthly.    Current Outpatient Medications on File Prior to Visit  Medication Sig Dispense Refill   acetone, urine, test strip Check ketones per protocol 50 each 3   BAYER MICROLET LANCETS lancets Check sugars 6 times daily and per protocol for hyper and hypoglycemia 250 each 3   Blood Glucose Monitoring Suppl (ONETOUCH VERIO) w/Device KIT Use to check blood sugar 4 times a day     Continuous Blood Gluc Receiver (DEXCOM G6 RECEIVER) DEVI For continuous blood glucose monitoring 4 times a day; E10.42 1 each  0   Continuous Blood Gluc Sensor (DEXCOM G6 SENSOR) MISC For continuous blood glucose monitoring 4 times a day; E10.42 9 each 3   Continuous Blood Gluc Transmit (DEXCOM G6 TRANSMITTER) MISC For continuous blood glucose monitoring 4 times a day; E10.42 1 each 4   frovatriptan (FROVA) 2.5 MG tablet Take 1 tablet (2.5 mg total) by mouth as needed for migraine. If recurs, may repeat after 2 hours. Max of 3 tabs in 24 hours. 10 tablet 0   Glucagon 3 MG/DOSE POWD Place 3 mg into the nose once as needed for up to 1 dose. 1 each 11   glucose blood (ONETOUCH VERIO) test strip Checks blood sugar 4 times daily 400 each 11  Insulin Glargine (LANTUS SOLOSTAR) 100 UNIT/ML Solostar Pen Use up to 45 units daily. 15 pen 3   Insulin Glargine-yfgn (SEMGLEE, YFGN,) 100 UNIT/ML SOLN Inject 40 Units into the skin daily. 45 mL 1   Insulin Lispro-aabc, 1 U Dial, (LYUMJEV KWIKPEN) 100 UNIT/ML SOPN Inject under kin 4-15 units before meals (3x a day) 15 mL 11   Insulin Pen Needle (INSUPEN PEN NEEDLES) 32G X 4 MM MISC Inject insulin via insulin pen 5 times daily 500 each 3   lamoTRIgine (LAMICTAL) 150 MG tablet Take 150 mg by mouth daily.     promethazine (PHENERGAN) 25 MG tablet Take 1 tablet (25 mg total) by mouth every 8 (eight) hours as needed for nausea or vomiting. 30 tablet 3   No current facility-administered medications on file prior to visit.   Allergies  Allergen Reactions   Levemir [Insulin Detemir]     Hives at injection sites   Augmentin [Amoxicillin-Pot Clavulanate]     Vomiting as young child   Omnicef [Cefdinir]     Vomiting as young child.   Family History  Problem Relation Age of Onset   Hypothyroidism Mother    Cancer Mother        appendix cancer   Diabetes Father        type 2   Hypothyroidism Paternal Grandmother    Diabetes Paternal Grandmother        type 2   Diabetes Cousin        type 1    PE: BP 120/70 (BP Location: Right Arm, Patient Position: Sitting, Cuff Size: Normal)    Pulse (!) 113   Ht 5' 6"  (1.676 m)   Wt 190 lb 9.6 oz (86.5 kg)   SpO2 98%   BMI 30.76 kg/m   Wt Readings from Last 3 Encounters:  07/28/21 190 lb 9.6 oz (86.5 kg)  03/17/21 192 lb 6.4 oz (87.3 kg)  12/16/20 184 lb 6.4 oz (83.6 kg)   Constitutional: overweight, in NAD Eyes: PERRLA, EOMI, no exophthalmos ENT: moist mucous membranes, no thyromegaly, no cervical lymphadenopathy Cardiovascular: tachycardia, RR, No MRG Respiratory: CTA B Gastrointestinal: abdomen soft, NT, ND, BS+ Musculoskeletal: no deformities, strength intact in all 4 Skin: moist, warm, no rashes Neurological: no tremor with outstretched hands, DTR normal in all 4  ASSESSMENT: 1. DM1, uncontrolled, without long-term complications, but with hyperglycemia  2. Overweight  PLAN:  1. Patient with longstanding, uncontrolled, type 1 diabetes, on basal-bolus insulin regimen, with still poor control, worse at last visit.  At that time, HbA1c was 8.6%, increased from 8.0%.  Reviewing her CGM tracings, it appears that her sugars are improving while she was sleeping in the day but then increasing drastically after dinner.  They remained high throughout the night and starting to decrease around 11 PM.  She had a nadir of blood sugars around 6 PM, before her first meal of the day.  Occasionally, sugars are lower around that time.  We discussed about injecting more Lyumjev before the first meal of the day.  She was estimating the boluses rather than using an insulin to carb ratio and I advised her to increase the doses before dinner since this was the larger meal of her day and to continue with the lower dose of before other meals.  Since she was mentioning that she was mainly eating 1 meal a day, shortly advised her to spread her calories in 2-3 meals a day.  We also increased her long-acting insulin at night. CGM  interpretation: -At today's visit, we reviewed her CGM downloads: It appears that 40% of values are in target range (goal  >70%), while 59.9% are higher than 180 (goal <25%), and 0.1% are lower than 70 (goal <4%).  The calculated average blood sugar is 207.  The projected HbA1c for the next 3 months (GMI) is 8.3%. -Reviewing the CGM trends, it appears that her sugars continue to remain better controlled from approximately 10-11 AM to approximately 9-10 PM but afterwards, they increase after the first meal of her day (dinner).  They visit her father after her lunch in the middle of the night and sugars then started to improve after 6-7AM.  Since last visit, she did start to increase her evening insulin dose but she is afraid to inject more to avoid low blood sugars.  Upon review of her CGM downloads, she is very seldom has lower blood sugars so for now, I advised her to increase the dose of insulin with her dinner.  She also needs a higher dose of insulin before her nighttime meal (lunch).  She continues to prefer to use flexible doses rather than an insulin to carb ratio.  I believe her Semglee dose is adequate so we will continue it for now. -I suggested to: Patient Instructions  Please continue: - Semglee              30 units at 10 pm             20 units at 10 am  Change: - Lyumjev: 18-24 units before dinner 10-12 units before lunch  Continue the same sliding scale: Target 100 ISF 20   Please return in 4 months.  - we checked her HbA1c: 8.0% (lower) - advised to check sugars at different times of the day - 4x a day, rotating check times - advised for yearly eye exams >> she is not UTD but plans to schedule another appointment - return to clinic in 4 months  2. Overweight -At last visit, we discussed about the importance of improving diet.  Her diet is hindered by her reverse circadian rhythm and also by the fact that she was only eating 1 meal a day, dinner, around 5-6 PM.  We did discuss about trying to spread her meals throughout the day.  Since last visit, she introduced a lunch. -At this visit, she  tells me that she lost weight to the low 180s but then gained them back.  She is frustrated about this.  She does plan to restart exercising. -She is net 2 pounds negative since last visit  Philemon Kingdom, MD PhD [facility]

## 2021-08-11 DIAGNOSIS — F332 Major depressive disorder, recurrent severe without psychotic features: Secondary | ICD-10-CM | POA: Diagnosis not present

## 2021-08-24 ENCOUNTER — Telehealth: Payer: Self-pay

## 2021-08-24 NOTE — Telephone Encounter (Signed)
Pt needs dexcom g6 transmitter and sensors. Please [phone]

## 2021-08-31 ENCOUNTER — Other Ambulatory Visit: Payer: Self-pay | Admitting: Internal Medicine

## 2021-08-31 DIAGNOSIS — E1065 Type 1 diabetes mellitus with hyperglycemia: Secondary | ICD-10-CM

## 2021-09-08 DIAGNOSIS — F332 Major depressive disorder, recurrent severe without psychotic features: Secondary | ICD-10-CM | POA: Diagnosis not present

## 2021-09-17 ENCOUNTER — Other Ambulatory Visit: Payer: Self-pay

## 2021-09-17 ENCOUNTER — Encounter: Payer: Self-pay | Admitting: Medical-Surgical

## 2021-09-17 ENCOUNTER — Ambulatory Visit (INDEPENDENT_AMBULATORY_CARE_PROVIDER_SITE_OTHER): Payer: BC Managed Care – PPO | Admitting: Medical-Surgical

## 2021-09-17 VITALS — BP 136/90 | HR 118 | Resp 20 | Ht 66.0 in | Wt 178.4 lb

## 2021-09-17 DIAGNOSIS — F411 Generalized anxiety disorder: Secondary | ICD-10-CM | POA: Diagnosis not present

## 2021-09-17 DIAGNOSIS — F4310 Post-traumatic stress disorder, unspecified: Secondary | ICD-10-CM

## 2021-09-17 DIAGNOSIS — F39 Unspecified mood [affective] disorder: Secondary | ICD-10-CM

## 2021-09-17 DIAGNOSIS — R0602 Shortness of breath: Secondary | ICD-10-CM | POA: Diagnosis not present

## 2021-09-17 DIAGNOSIS — R000 Tachycardia, unspecified: Secondary | ICD-10-CM

## 2021-09-17 DIAGNOSIS — F988 Other specified behavioral and emotional disorders with onset usually occurring in childhood and adolescence: Secondary | ICD-10-CM

## 2021-09-17 NOTE — Addendum Note (Signed)
Addended by: Chalmers Cater on: 09/17/2021 02:33 PM   Modules accepted: Orders

## 2021-09-17 NOTE — Progress Notes (Signed)
  HPI with pertinent ROS:   CC: Tachycardia, shortness of breath  HPI: Pleasant 25 year old female accompanied by her mother presenting for evaluation of tachycardia and shortness of breath. She notes that she is under the care of a psychiatrist and they have been adjusting her medications. Recently restarted lamictal and had a dose increase to 200mg  daily. After that, she began to experience palpitations, fatigue, increased anxiety, dizziness, and fibromyalgia type symptoms. Symptoms are intermittent and not associated with any particular activity or time of day. She and her mom feel that her symptoms are related to the lamictal and she has started tapering off the medication. Has reduced her dose to 100mg  daily at this point and plans to come off of it completely. She does have quite a bit of anxiety with the medication changes but notes that she is under constant supervision and has no plans for self harm and no HI. Is interested in seeing a different psychiatrist since she doesn't feel that it's going well with her current provider. Denies fever, chills, chest pain, syncope, vision changes, and unusual headaches.   I reviewed the past medical history, family history, social history, surgical history, and allergies today and no changes were needed.  Please see the problem list section below in epic for further details.   Physical exam:   General: Well Developed, well nourished, and in no acute distress.  Neuro: Alert and oriented x3.  HEENT: Normocephalic, atraumatic.  Skin: Warm and dry. Cardiac: Regular rate and rhythm, no murmurs rubs or gallops, no lower extremity edema.  Respiratory: Clear to auscultation bilaterally. Not using accessory muscles, speaking in full sentences.  Impression and Recommendations:    1. Tachycardia 2. Shortness of breath Checking labs including lamotrigine level. EKG in office showing sinus tachycardia with possible left atrial enlargement. Review of echo from  approximately 2 years ago showing normal heart structure and function. Plan to re-evaluate EKG once medication has been fully discontinued. If still showing abnormal, may benefit from cardiology referral.  - CBC with Differential/Platelet - COMPLETE METABOLIC PANEL WITH GFR - TSH - EKG; Future - Magnesium - Lamotrigine level  3. Mood disorder (HCC) 4. Generalized anxiety disorder 5. PTSD (post-traumatic stress disorder) 6. Attention deficit disorder, unspecified hyperactivity presence Referring to psychiatry per patient request. No medication adjustments today.  - Ambulatory referral to Psychiatry  Return if symptoms worsen or fail to improve. ___________________________________________ , DNP, APRN, FNP-BC [facility] [place]

## 2021-09-20 LAB — COMPLETE METABOLIC PANEL WITH GFR
AG Ratio: 1.5 (calc) (ref 1.0–2.5)
ALT: 10 U/L (ref 6–29)
AST: 11 U/L (ref 10–30)
Albumin: 4.6 g/dL (ref 3.6–5.1)
Alkaline phosphatase (APISO): 64 U/L (ref 31–125)
BUN: 8 mg/dL (ref 7–25)
CO2: 20 mmol/L (ref 20–32)
Calcium: 9.9 mg/dL (ref 8.6–10.2)
Chloride: 97 mmol/L — ABNORMAL LOW (ref 98–110)
Creat: 0.83 mg/dL (ref 0.50–0.96)
Globulin: 3 g/dL (calc) (ref 1.9–3.7)
Glucose, Bld: 343 mg/dL — ABNORMAL HIGH (ref 65–99)
Potassium: 4.5 mmol/L (ref 3.5–5.3)
Sodium: 137 mmol/L (ref 135–146)
Total Bilirubin: 0.8 mg/dL (ref 0.2–1.2)
Total Protein: 7.6 g/dL (ref 6.1–8.1)
eGFR: 101 mL/min/{1.73_m2} (ref >=60)

## 2021-09-20 LAB — CBC WITH DIFFERENTIAL/PLATELET
Absolute Monocytes: 823 cells/uL (ref 200–950)
Basophils Absolute: 49 cells/uL (ref 0–200)
Basophils Relative: 0.5 %
Eosinophils Absolute: 676 cells/uL — ABNORMAL HIGH (ref 15–500)
Eosinophils Relative: 6.9 %
HCT: 49.1 % — ABNORMAL HIGH (ref 35.0–45.0)
Hemoglobin: 17.1 g/dL — ABNORMAL HIGH (ref 11.7–15.5)
Lymphs Abs: 2440 cells/uL (ref 850–3900)
MCH: 30.1 pg (ref 27.0–33.0)
MCHC: 34.8 g/dL (ref 32.0–36.0)
MCV: 86.3 fL (ref 80.0–100.0)
MPV: 13.8 fL — ABNORMAL HIGH (ref 7.5–12.5)
Monocytes Relative: 8.4 %
Neutro Abs: 5811 cells/uL (ref 1500–7800)
Neutrophils Relative %: 59.3 %
Platelets: 301 10*3/uL (ref 140–400)
RBC: 5.69 10*6/uL — ABNORMAL HIGH (ref 3.80–5.10)
RDW: 13 % (ref 11.0–15.0)
Total Lymphocyte: 24.9 %
WBC: 9.8 10*3/uL (ref 3.8–10.8)

## 2021-09-20 LAB — LAMOTRIGINE LEVEL: Lamotrigine Lvl: 3.8 ug/mL — ABNORMAL LOW (ref 4.0–18.0)

## 2021-09-20 LAB — TSH: TSH: 0.46 mIU/L

## 2021-09-20 LAB — MAGNESIUM: Magnesium: 1.9 mg/dL (ref 1.5–2.5)

## 2021-10-06 ENCOUNTER — Other Ambulatory Visit: Payer: Self-pay

## 2021-10-06 DIAGNOSIS — R000 Tachycardia, unspecified: Secondary | ICD-10-CM

## 2021-10-11 ENCOUNTER — Emergency Department (HOSPITAL_COMMUNITY): Payer: BC Managed Care – PPO

## 2021-10-11 ENCOUNTER — Other Ambulatory Visit: Payer: Self-pay

## 2021-10-11 ENCOUNTER — Observation Stay (HOSPITAL_COMMUNITY)
Admission: EM | Admit: 2021-10-11 | Discharge: 2021-10-12 | Disposition: A | Discharge status: Home / Self Care | Payer: BC Managed Care – PPO | Attending: Internal Medicine | Admitting: Internal Medicine

## 2021-10-11 DIAGNOSIS — Z20822 Contact with and (suspected) exposure to covid-19: Secondary | ICD-10-CM | POA: Insufficient documentation

## 2021-10-11 DIAGNOSIS — R0602 Shortness of breath: Secondary | ICD-10-CM | POA: Diagnosis not present

## 2021-10-11 DIAGNOSIS — E1110 Type 2 diabetes mellitus with ketoacidosis without coma: Secondary | ICD-10-CM | POA: Diagnosis present

## 2021-10-11 DIAGNOSIS — Z87891 Personal history of nicotine dependence: Secondary | ICD-10-CM | POA: Insufficient documentation

## 2021-10-11 DIAGNOSIS — E1069 Type 1 diabetes mellitus with other specified complication: Secondary | ICD-10-CM | POA: Diagnosis present

## 2021-10-11 DIAGNOSIS — R000 Tachycardia, unspecified: Secondary | ICD-10-CM

## 2021-10-11 DIAGNOSIS — F4310 Post-traumatic stress disorder, unspecified: Secondary | ICD-10-CM | POA: Diagnosis present

## 2021-10-11 DIAGNOSIS — E1010 Type 1 diabetes mellitus with ketoacidosis without coma: Secondary | ICD-10-CM | POA: Diagnosis not present

## 2021-10-11 DIAGNOSIS — F988 Other specified behavioral and emotional disorders with onset usually occurring in childhood and adolescence: Secondary | ICD-10-CM | POA: Diagnosis present

## 2021-10-11 DIAGNOSIS — R002 Palpitations: Secondary | ICD-10-CM

## 2021-10-11 DIAGNOSIS — Z794 Long term (current) use of insulin: Secondary | ICD-10-CM | POA: Diagnosis not present

## 2021-10-11 DIAGNOSIS — F411 Generalized anxiety disorder: Secondary | ICD-10-CM | POA: Diagnosis present

## 2021-10-11 LAB — CBC WITH DIFFERENTIAL/PLATELET
Abs Immature Granulocytes: 0.06 10*3/uL (ref 0.00–0.07)
Basophils Absolute: 0.1 10*3/uL (ref 0.0–0.1)
Basophils Relative: 1 %
Eosinophils Absolute: 0.4 10*3/uL (ref 0.0–0.5)
Eosinophils Relative: 3 %
HCT: 51 % — ABNORMAL HIGH (ref 36.0–46.0)
Hemoglobin: 18.5 g/dL — ABNORMAL HIGH (ref 12.0–15.0)
Immature Granulocytes: 1 %
Lymphocytes Relative: 26 %
Lymphs Abs: 2.9 10*3/uL (ref 0.7–4.0)
MCH: 30 pg (ref 26.0–34.0)
MCHC: 36.3 g/dL — ABNORMAL HIGH (ref 30.0–36.0)
MCV: 82.8 fL (ref 80.0–100.0)
Monocytes Absolute: 1 10*3/uL (ref 0.1–1.0)
Monocytes Relative: 9 %
Neutro Abs: 6.8 10*3/uL (ref 1.7–7.7)
Neutrophils Relative %: 60 %
Platelets: 320 10*3/uL (ref 150–400)
RBC: 6.16 MIL/uL — ABNORMAL HIGH (ref 3.87–5.11)
RDW: 13.4 % (ref 11.5–15.5)
WBC: 11.1 10*3/uL — ABNORMAL HIGH (ref 4.0–10.5)
nRBC: 0 % (ref 0.0–0.2)

## 2021-10-11 LAB — COMPREHENSIVE METABOLIC PANEL
ALT: 12 U/L (ref 0–44)
AST: 11 U/L — ABNORMAL LOW (ref 15–41)
Albumin: 4.2 g/dL (ref 3.5–5.0)
Alkaline Phosphatase: 59 U/L (ref 38–126)
Anion gap: 16 — ABNORMAL HIGH (ref 5–15)
BUN: 11 mg/dL (ref 6–20)
CO2: 15 mmol/L — ABNORMAL LOW (ref 22–32)
Calcium: 9.2 mg/dL (ref 8.9–10.3)
Chloride: 104 mmol/L (ref 98–111)
Creatinine, Ser: 0.92 mg/dL (ref 0.44–1.00)
GFR, Estimated: >60 mL/min (ref >60)
Glucose, Bld: 280 mg/dL — ABNORMAL HIGH (ref 70–99)
Potassium: 3.7 mmol/L (ref 3.5–5.1)
Sodium: 135 mmol/L (ref 135–145)
Total Bilirubin: 2.1 mg/dL — ABNORMAL HIGH (ref 0.3–1.2)
Total Protein: 7.5 g/dL (ref 6.5–8.1)

## 2021-10-11 LAB — I-STAT BETA HCG BLOOD, ED (MC, WL, AP ONLY): I-stat hCG, quantitative: <5 m[IU]/mL (ref <5)

## 2021-10-11 LAB — CBG MONITORING, ED: Glucose-Capillary: 233 mg/dL — ABNORMAL HIGH (ref 70–99)

## 2021-10-11 LAB — RESP PANEL BY RT-PCR (FLU A&B, COVID) ARPGX2
Influenza A by PCR: NEGATIVE
Influenza B by PCR: NEGATIVE
SARS Coronavirus 2 by RT PCR: NEGATIVE

## 2021-10-11 LAB — BRAIN NATRIURETIC PEPTIDE: B Natriuretic Peptide: 5 pg/mL (ref 0.0–100.0)

## 2021-10-11 LAB — TSH: TSH: 1.086 u[IU]/mL (ref 0.350–4.500)

## 2021-10-11 LAB — TROPONIN I (HIGH SENSITIVITY): Troponin I (High Sensitivity): 6 ng/L (ref <18)

## 2021-10-11 LAB — T4, FREE: Free T4: 0.99 ng/dL (ref 0.61–1.12)

## 2021-10-11 MED ORDER — LACTATED RINGERS IV BOLUS
20.0000 mL/kg | Freq: Once | INTRAVENOUS | Status: AC
  Administered 2021-10-12: 1000 mL via INTRAVENOUS

## 2021-10-11 MED ORDER — SODIUM CHLORIDE 0.9 % IV BOLUS
1000.0000 mL | Freq: Once | INTRAVENOUS | Status: AC
  Administered 2021-10-11: 1000 mL via INTRAVENOUS

## 2021-10-11 MED ORDER — DEXTROSE IN LACTATED RINGERS 5 % IV SOLN: INTRAVENOUS | Status: DC

## 2021-10-11 MED ORDER — ENOXAPARIN SODIUM 40 MG/0.4ML IJ SOSY: 40.0000 mg | PREFILLED_SYRINGE | INTRAMUSCULAR | Status: DC

## 2021-10-11 MED ORDER — INSULIN REGULAR(HUMAN) IN NACL 100-0.9 UT/100ML-% IV SOLN
INTRAVENOUS | Status: DC
  Administered 2021-10-12: 4.5 [IU]/h via INTRAVENOUS
  Administered 2021-10-12: 1.6 [IU]/h via INTRAVENOUS
  Filled 2021-10-11: qty 100

## 2021-10-11 MED ORDER — DEXTROSE 50 % IV SOLN: 0.0000 mL | INTRAVENOUS | Status: DC | PRN

## 2021-10-11 MED ORDER — IOHEXOL 350 MG/ML SOLN
65.0000 mL | Freq: Once | INTRAVENOUS | Status: AC | PRN
  Administered 2021-10-11: 65 mL via INTRAVENOUS

## 2021-10-11 MED ORDER — LACTATED RINGERS IV SOLN: INTRAVENOUS | Status: DC

## 2021-10-11 MED ORDER — POTASSIUM CHLORIDE 10 MEQ/100ML IV SOLN
10.0000 meq | INTRAVENOUS | Status: AC
  Administered 2021-10-12 (×2): 10 meq via INTRAVENOUS
  Filled 2021-10-11 (×2): qty 100

## 2021-10-11 NOTE — H&P (Signed)
History and Physical   Veronica Liu PVV:[id number] DOB: 16-Apr-1996 DOA: 10/11/2021  PCP: Emeterio Reeve, DO   Patient coming from: Home  Chief Complaint: Palpitations, shortness of breath  HPI: Veronica Liu is a 25 y.o. female with medical history significant of type 1 diabetes, anxiety, depression, GERD, ADD presenting for palpitations and shortness of breath.  Patient states she has had ongoing shortness of breath and palpitations worse on standing for the past 5 weeks.  This occurs constantly and even occurs on sitting up in addition to on standing.  She has been evaluated by her PCP and has referral for cardiology upcoming on the 30th.  She was however, feeling worse in the last couple days.  She presented to the ED for further evaluation. She does report some "heartburn" for the past day or so, which is similar to her previous episodes of hyperglycemia/DKA.  She denies fevers, chills, chest pain, constipation, diarrhea, nausea, vomiting.   ED Course: Vital signs in the ED significant for initial tachycardia in the 100s and improved the 90s.  Lab work-up showed CMP with bicarb of 15 and gap of 16 with glucose of 280.  T bili 2.1.  CBC showed mild leukocytosis to 11.1 and erythrocytosis to 18 which is near her baseline of around 17.  TSH and T4 were normal.  Troponin was normal with repeat pending.  BNP was normal.  Lactic acid ordered and is pending.  Respiratory panel for flu and COVID pending.  UDS and urinalysis are pending.  Beta hydroxybutyric acid is pending.  VBG is pending.  CT PE study was negative for PE or any other acute abnormality.  Patient started on insulin drip and received 2.5 L in the ED.  Also received 20 mEq of IV potassium and started on a rate of IV fluids.  Review of Systems: As per HPI otherwise all other systems reviewed and are negative.  Past Medical History:  Diagnosis Date   Anxiety    Depression    Diabetes mellitus type I ([place])    Diabetic  gastroparesis ([place])    GERD (gastroesophageal reflux disease)     Past Surgical History:  Procedure Laterality Date   NO PAST SURGERIES      Social History  reports that she has quit smoking. Her smoking use included cigarettes. She has a 2.50 pack-year smoking history. She has never used smokeless tobacco. She reports that she does not drink alcohol and does not use drugs.  Allergies  Allergen Reactions   Levemir [Insulin Detemir]     Hives at injection sites   Augmentin [Amoxicillin-Pot Clavulanate]     Vomiting as young child   Omnicef [Cefdinir]     Vomiting as young child.    Family History  Problem Relation Age of Onset   Hypothyroidism Mother    Cancer Mother        appendix cancer   Diabetes Father        type 2   Hypothyroidism Paternal Grandmother    Diabetes Paternal Grandmother        type 2   Diabetes Cousin        type 1  Reviewed on admission  Prior to Admission medications   Medication Sig Start Date End Date Taking? Authorizing Provider  Glucagon 3 MG/DOSE POWD Place 3 mg into the nose once as needed for up to 1 dose. 03/17/21  Yes Philemon Kingdom, MD  ibuprofen (ADVIL) 200 MG tablet Take 200 mg by mouth every  6 (six) hours as needed for headache or mild pain.   Yes [provider]  Insulin Glargine-yfgn (SEMGLEE, YFGN,) 100 UNIT/ML SOLN Inject 40 Units into the skin daily. 04/23/21  Yes Philemon Kingdom, MD  Insulin Lispro-aabc, 1 U Dial, (LYUMJEV KWIKPEN) 100 UNIT/ML SOPN Inject under kin 4-15 units before meals (3x a day) 09/23/20  Yes Philemon Kingdom, MD  acetone, urine, test strip Check ketones per protocol 03/05/14   Lelon Huh, MD  BAYER MICROLET LANCETS lancets Check sugars 6 times daily and per protocol for hyper and hypoglycemia 03/05/14   Lelon Huh, MD  Blood Glucose Monitoring Suppl (ONETOUCH VERIO) w/Device KIT Use to check blood sugar 4 times a day 10/09/19   Philemon Kingdom, MD  Continuous Blood Gluc Receiver ([place]) [place] For continuous blood glucose monitoring 4 times a day; E10.42 07/04/20   Philemon Kingdom, MD  Continuous Blood Gluc Sensor (DEXCOM G6 SENSOR) MISC For continuous blood glucose monitoring 4 times a day; E10.42 09/15/20   Philemon Kingdom, MD  Continuous Blood Gluc Transmit (DEXCOM G6 TRANSMITTER) MISC USE FOR CONTINUOUS BLOOD GLUCOSE MONITORING FOUR TIMES A DAY 08/31/21   Philemon Kingdom, MD  frovatriptan (FROVA) 2.5 MG tablet Take 1 tablet (2.5 mg total) by mouth as needed for migraine. If recurs, may repeat after 2 hours. Max of 3 tabs in 24 hours. Patient not taking: No sig reported 11/11/20   Emeterio Reeve, DO  glucose blood (ONETOUCH VERIO) test strip Checks blood sugar 4 times daily 01/14/20   Philemon Kingdom, MD  Insulin Pen Needle (INSUPEN PEN NEEDLES) 32G X 4 MM MISC Inject insulin via insulin pen 5 times daily 03/17/21   Philemon Kingdom, MD  promethazine (PHENERGAN) 25 MG tablet Take 1 tablet (25 mg total) by mouth every 8 (eight) hours as needed for nausea or vomiting. Patient not taking: No sig reported 10/18/18   Trixie Dredge, [place]    Physical Exam: Vitals:   10/11/21 2115 10/11/21 2130 10/11/21 2330 10/11/21 2345  BP: 108/90 119/88 123/90 126/88  Pulse: 94 93 95 93  Resp: 18 12 (!) 22 19  Temp:      TempSrc:      SpO2: 99% 99% 100% 98%  Weight:      Height:       Physical Exam Constitutional:      General: She is not in acute distress.    Appearance: Normal appearance.  HENT:     Head: Normocephalic and atraumatic.     Mouth/Throat:     Mouth: Mucous membranes are moist.     Pharynx: Oropharynx is clear.  Eyes:     Extraocular Movements: Extraocular movements intact.     Pupils: Pupils are equal, round, and reactive to light.  Cardiovascular:     Rate and Rhythm: Regular rhythm. Tachycardia present.     Pulses: Normal pulses.     Heart sounds: Normal heart sounds.  Pulmonary:     Effort: Pulmonary effort is normal. No  respiratory distress.     Breath sounds: Normal breath sounds.  Abdominal:     General: Bowel sounds are normal. There is no distension.     Palpations: Abdomen is soft.     Tenderness: There is no abdominal tenderness.  Musculoskeletal:        General: No swelling or deformity.  Skin:    General: Skin is warm and dry.  Neurological:     General: No focal deficit present.     Mental Status:  Mental status is at baseline.   Labs on Admission: I have personally reviewed following labs and imaging studies  CBC: Recent Labs  Lab 10/11/21 2112  WBC 11.1*  NEUTROABS 6.8  HGB 18.5*  HCT 51.0*  MCV 82.8  PLT 893    Basic Metabolic Panel: Recent Labs  Lab 10/11/21 2112  NA 135  K 3.7  CL 104  CO2 15*  GLUCOSE 280*  BUN 11  CREATININE 0.92  CALCIUM 9.2    GFR: Estimated Creatinine Clearance: 91.7 mL/min (by C-G formula based on SCr of 0.92 mg/dL).  Liver Function Tests: Recent Labs  Lab 10/11/21 2112  AST 11*  ALT 12  ALKPHOS 59  BILITOT 2.1*  PROT 7.5  ALBUMIN 4.2    Urine analysis:    Component Value Date/Time   COLORURINE YELLOW 06/03/2013 0748   APPEARANCEUR CLOUDY (A) 06/03/2013 0748   LABSPEC 1.031 (H) 06/03/2013 0748   PHURINE 6.0 06/03/2013 0748   GLUCOSEU >1000 (A) 06/03/2013 0748   HGBUR LARGE (A) 06/03/2013 0748   BILIRUBINUR NEGATIVE 06/03/2013 0748   KETONESUR NEGATIVE 06/05/2013 2309   PROTEINUR NEGATIVE 06/03/2013 0748   UROBILINOGEN 0.2 06/03/2013 0748   NITRITE NEGATIVE 06/03/2013 0748   LEUKOCYTESUR TRACE (A) 06/03/2013 0748    Radiological Exams on Admission: CT Angio Chest PE W and/or Wo Contrast  Result Date: 10/11/2021 CLINICAL DATA:  Shortness of breath and dyspnea EXAM: CT ANGIOGRAPHY CHEST WITH CONTRAST TECHNIQUE: Multidetector CT imaging of the chest was performed using the standard protocol during bolus administration of intravenous contrast. Multiplanar CT image reconstructions and MIPs were obtained to evaluate the  vascular anatomy. CONTRAST:  34mL OMNIPAQUE IOHEXOL 350 MG/ML SOLN COMPARISON:  None. FINDINGS: Cardiovascular: Satisfactory opacification of the pulmonary arteries to the segmental level. No evidence of pulmonary embolism. Normal heart size. No pericardial effusion. Mediastinum/Nodes: No enlarged mediastinal, hilar, or axillary lymph nodes. Thyroid gland, trachea, and esophagus demonstrate no significant findings. There is a small volume of residual thymus in the substernal space, appears normal for age. Lungs/Pleura: Lungs are clear. No pleural effusion or pneumothorax. Upper Abdomen: No acute abnormality. Musculoskeletal: there is mild thoracic kyphosis. No spinal compression fracture or acute skeletal findings. Review of the MIP images confirms the above findings. IMPRESSION: No acute chest CT or CTA findings. Electronically Signed   By: Telford Nab M.D.   On: 10/11/2021 22:45    EKG: Independently reviewed.  Sinus tachycardia at 117 bpm.  Repolarization abnormality noted.  Assessment/Plan Principal Problem:   DKA (diabetic ketoacidosis) ([place]) Active Problems:   Tachycardia  DKA > Initially presented with shortness of breath and palpitations described below.  Has been worse for the past couple days and had noticed some abdominal pain/heartburn sensation similar to previous DKA episodes. > Noted to have bicarb of 15 and gap of 16 with glucose of 280 in the ED.  Ketones not yet resulted as UDS not collected and beta hydroxybutyric acid is pending. > Does have mild leukocytosis which may be reactive.  No infectious trigger found. - Admit to progressive - Continue insulin drip - Potassium supplementation given in ED  - LR at 1 mL/hr until CBG less than 250 - Switch to D5-LR when 1 CBG less than 250 - Nothing by mouth  - BMET every 4 hours - CBG Q1H - Once anion gap closed 2, start CM diet and if able to eat, administer Lantus 40 units (home dose) - Continue insulin drip for 1-2 more hours,  then discontinue and  start SSI-S  - DC fluids if eating, drinking, and off insulin drip  Orthostatic tachycardia Shortness of breath > Has had this ongoing for 5 weeks.  Has outpatient cardiology referral however she is from work for the last couple days so she sought evaluation in the ED, this worsening is now thought to be likely due to developing DKA as above. > Orthostatic tachycardia could be consistent with POTS, possibly autonomic dysfunction secondary to diabetes.   > TSH and T4 normal in the ED.  Troponin normal in the ED.  BNP normal in the ED. > Tachycardia she presented with improved with IV fluids. - Orthostatic vital signs - Will be on cardiac monitoring this admission - Electrolytes are stable, will check magnesium  DVT prophylaxis: Lovenox Code Status:   Full  Family Communication:  None on admission.  Her mother was with her previously in the ED but was not available when I was in the room.  Patient states that her mother is up-to-date on her diagnosis and plans  Disposition Plan:   Patient is from:  Home  Anticipated DC to:  Home  Anticipated DC date:  1 to 3 days  Anticipated DC barriers: None  Consults called:  None  Admission status:  Observation, progressive   Severity of Illness: The appropriate patient status for this patient is OBSERVATION. Observation status is judged to be reasonable and necessary in order to provide the required intensity of service to ensure the patient's safety. The patient's presenting symptoms, physical exam findings, and initial radiographic and laboratory data in the context of their medical condition is felt to place them at decreased risk for further clinical deterioration. Furthermore, it is anticipated that the patient will be medically stable for discharge from the hospital within 2 midnights of admission.    Marcelyn Bruins MD [facility]  How to contact the [facility] Attending or Consulting provider [place] or covering  provider during after hours [place], for this patient?   Check the care team in [facility] and look for a) attending/consulting [facility] provider listed and b) the [facility] team listed Log into [email or web address] and use [place]'s universal password to access. If you do not have the password, please contact the hospital operator. Locate the [facility] provider you are looking for under [facility] and page to a number that you can be directly reached. If you still have difficulty reaching the provider, please page the [facility] (Director on Call) for the Hospitalists listed on amion for assistance.  10/12/2021, 12:26 AM

## 2021-10-11 NOTE — ED Notes (Signed)
Notified CT pt had IV and lab work has been sent.

## 2021-10-12 DIAGNOSIS — R002 Palpitations: Secondary | ICD-10-CM | POA: Diagnosis not present

## 2021-10-12 DIAGNOSIS — R0602 Shortness of breath: Secondary | ICD-10-CM | POA: Diagnosis not present

## 2021-10-12 DIAGNOSIS — R000 Tachycardia, unspecified: Secondary | ICD-10-CM | POA: Diagnosis not present

## 2021-10-12 DIAGNOSIS — F411 Generalized anxiety disorder: Secondary | ICD-10-CM | POA: Diagnosis not present

## 2021-10-12 DIAGNOSIS — E1069 Type 1 diabetes mellitus with other specified complication: Secondary | ICD-10-CM

## 2021-10-12 DIAGNOSIS — E1010 Type 1 diabetes mellitus with ketoacidosis without coma: Secondary | ICD-10-CM | POA: Diagnosis not present

## 2021-10-12 DIAGNOSIS — Z20822 Contact with and (suspected) exposure to covid-19: Secondary | ICD-10-CM | POA: Diagnosis not present

## 2021-10-12 DIAGNOSIS — Z87891 Personal history of nicotine dependence: Secondary | ICD-10-CM | POA: Diagnosis not present

## 2021-10-12 DIAGNOSIS — F4310 Post-traumatic stress disorder, unspecified: Secondary | ICD-10-CM

## 2021-10-12 DIAGNOSIS — F988 Other specified behavioral and emotional disorders with onset usually occurring in childhood and adolescence: Secondary | ICD-10-CM | POA: Diagnosis not present

## 2021-10-12 DIAGNOSIS — Z794 Long term (current) use of insulin: Secondary | ICD-10-CM | POA: Diagnosis not present

## 2021-10-12 LAB — CBG MONITORING, ED
Glucose-Capillary: 200 mg/dL — ABNORMAL HIGH (ref 70–99)
Glucose-Capillary: 149 mg/dL — ABNORMAL HIGH (ref 70–99)
Glucose-Capillary: 126 mg/dL — ABNORMAL HIGH (ref 70–99)
Glucose-Capillary: 154 mg/dL — ABNORMAL HIGH (ref 70–99)
Glucose-Capillary: 171 mg/dL — ABNORMAL HIGH (ref 70–99)
Glucose-Capillary: 147 mg/dL — ABNORMAL HIGH (ref 70–99)
Glucose-Capillary: 139 mg/dL — ABNORMAL HIGH (ref 70–99)
Glucose-Capillary: 118 mg/dL — ABNORMAL HIGH (ref 70–99)
Glucose-Capillary: 137 mg/dL — ABNORMAL HIGH (ref 70–99)
Glucose-Capillary: 139 mg/dL — ABNORMAL HIGH (ref 70–99)

## 2021-10-12 LAB — I-STAT VENOUS BLOOD GAS, ED
Acid-base deficit: 9 mmol/L — ABNORMAL HIGH (ref 0.0–2.0)
Bicarbonate: 15.8 mmol/L — ABNORMAL LOW (ref 20.0–28.0)
Calcium, Ion: 1.11 mmol/L — ABNORMAL LOW (ref 1.15–1.40)
HCT: 42 % (ref 36.0–46.0)
Hemoglobin: 14.3 g/dL (ref 12.0–15.0)
O2 Saturation: 79 %
Potassium: 4.1 mmol/L (ref 3.5–5.1)
Sodium: 139 mmol/L (ref 135–145)
TCO2: 17 mmol/L — ABNORMAL LOW (ref 22–32)
pCO2, Ven: 31.1 mmHg — ABNORMAL LOW (ref 44.0–60.0)
pH, Ven: 7.314 (ref 7.250–7.430)
pO2, Ven: 46 mmHg — ABNORMAL HIGH (ref 32.0–45.0)

## 2021-10-12 LAB — BASIC METABOLIC PANEL WITH GFR
Anion gap: 12 (ref 5–15)
BUN: 9 mg/dL (ref 6–20)
CO2: 16 mmol/L — ABNORMAL LOW (ref 22–32)
Calcium: 8.7 mg/dL — ABNORMAL LOW (ref 8.9–10.3)
Chloride: 106 mmol/L (ref 98–111)
Creatinine, Ser: 0.74 mg/dL (ref 0.44–1.00)
GFR, Estimated: >60 mL/min
Glucose, Bld: 198 mg/dL — ABNORMAL HIGH (ref 70–99)
Potassium: 3.4 mmol/L — ABNORMAL LOW (ref 3.5–5.1)
Sodium: 134 mmol/L — ABNORMAL LOW (ref 135–145)

## 2021-10-12 LAB — BASIC METABOLIC PANEL
Anion gap: 11 (ref 5–15)
BUN: 8 mg/dL (ref 6–20)
CO2: 16 mmol/L — ABNORMAL LOW (ref 22–32)
Calcium: 8.7 mg/dL — ABNORMAL LOW (ref 8.9–10.3)
Chloride: 107 mmol/L (ref 98–111)
Creatinine, Ser: 0.62 mg/dL (ref 0.44–1.00)
GFR, Estimated: >60 mL/min (ref >60)
Glucose, Bld: 150 mg/dL — ABNORMAL HIGH (ref 70–99)
Potassium: 3.4 mmol/L — ABNORMAL LOW (ref 3.5–5.1)
Sodium: 134 mmol/L — ABNORMAL LOW (ref 135–145)

## 2021-10-12 LAB — HEMOGLOBIN A1C
Hgb A1c MFr Bld: 8.6 % — ABNORMAL HIGH (ref 4.8–5.6)
Mean Plasma Glucose: 200.12 mg/dL

## 2021-10-12 LAB — BETA-HYDROXYBUTYRIC ACID: Beta-Hydroxybutyric Acid: 4.7 mmol/L — ABNORMAL HIGH (ref 0.05–0.27)

## 2021-10-12 LAB — LACTIC ACID, PLASMA: Lactic Acid, Venous: 1 mmol/L (ref 0.5–1.9)

## 2021-10-12 LAB — MAGNESIUM: Magnesium: 1.8 mg/dL (ref 1.7–2.4)

## 2021-10-12 LAB — HIV ANTIBODY (ROUTINE TESTING W REFLEX): HIV Screen 4th Generation wRfx: NONREACTIVE

## 2021-10-12 MED ORDER — POTASSIUM CHLORIDE CRYS ER 20 MEQ PO TBCR
40.0000 meq | EXTENDED_RELEASE_TABLET | Freq: Once | ORAL | Status: AC
  Administered 2021-10-12: 40 meq via ORAL
  Filled 2021-10-12: qty 2

## 2021-10-12 MED ORDER — MAGNESIUM SULFATE 2 GM/50ML IV SOLN
2.0000 g | Freq: Once | INTRAVENOUS | Status: AC
  Administered 2021-10-12: 2 g via INTRAVENOUS
  Filled 2021-10-12: qty 50

## 2021-10-12 MED ORDER — INSULIN GLARGINE-YFGN 100 UNIT/ML ~~LOC~~ SOLN
10.0000 [IU] | Freq: Every day | SUBCUTANEOUS | Status: DC
  Administered 2021-10-12: 10 [IU] via SUBCUTANEOUS
  Filled 2021-10-12: qty 0.1

## 2021-10-12 MED ORDER — PANTOPRAZOLE SODIUM 40 MG PO TBEC: 40.0000 mg | DELAYED_RELEASE_TABLET | Freq: Every day | ORAL | 0 refills | Status: DC

## 2021-10-12 MED ORDER — INSULIN ASPART 100 UNIT/ML IJ SOLN
0.0000 [IU] | Freq: Three times a day (TID) | INTRAMUSCULAR | Status: DC
  Administered 2021-10-12: 1 [IU] via SUBCUTANEOUS

## 2021-10-12 MED ORDER — PANTOPRAZOLE SODIUM 40 MG PO TBEC
40.0000 mg | DELAYED_RELEASE_TABLET | Freq: Every day | ORAL | Status: DC
  Administered 2021-10-12: 40 mg via ORAL
  Filled 2021-10-12: qty 1

## 2021-10-12 NOTE — ED Notes (Signed)
Pt not responding to be roomed 2x 

## 2021-10-12 NOTE — ED Notes (Signed)
This RN spoke with the attending covering night shift admits and agrees that we will let the day shift team assess the the situation and the need to transition from the insulin drip. This RN will pass that along to the dayshift RN as well and will continue to monitor.

## 2021-10-12 NOTE — ED Notes (Addendum)
This RN had the secretary page admitting r/t to the pt's insulin drip. Pt's CBG is currently 171 with an anion gap less than 12. Her current gap is 11. Per Endotool, consider transitioning pt from drip d/t stabilization. Will continue to monitor.

## 2021-10-12 NOTE — Progress Notes (Signed)
Inpatient Diabetes Program Recommendations  AACE/ADA: New Consensus Statement on Inpatient Glycemic Control (2015)  Target Ranges:  Prepandial:   less than 140 mg/dL      Peak postprandial:   less than 180 mg/dL (1-2 hours)      Critically ill patients:  140 - 180 mg/dL   Lab Results  Component Value Date   GLUCAP 147 (H) 10/12/2021   HGBA1C 8.6 (H) 10/12/2021    Review of Glycemic Control Results for Veronica Liu, Veronica Liu (MRN [id number]) as of 10/12/2021 07:52  Ref. Range 10/12/2021 03:53  Potassium Latest Ref Range: 3.5 - 5.1 mmol/L 3.4 (L)  Chloride Latest Ref Range: 98 - 111 mmol/L 107  CO2 Latest Ref Range: 22 - 32 mmol/L 16 (L)  Glucose Latest Ref Range: 70 - 99 mg/dL 132 (H)  BUN Latest Ref Range: 6 - 20 mg/dL 8  Creatinine Latest Ref Range: 0.44 - 1.00 mg/dL 4.40  Calcium Latest Ref Range: 8.9 - 10.3 mg/dL 8.7 (L)  Anion gap Latest Ref Range: 5 - 15  11   Diabetes history: DM1 Outpatient Diabetes medications: Semglee 40 units qd, Lispro 4-15 units meal coverage Current orders for Inpatient glycemic control: IV insulin per endotool  Inpatient Diabetes Program Recommendations:   Next Beta-hydroxybutyric for 0700 not colllected yet. When patient ready for transition to subcutaneous insulin: Consider: -Semglee 32 units -Novolog 0-9 units q 4 hrs. While NPO, then tid + 0-5 units hs when eating -Add Novolog 4 units tid meal coverage when eating 50% meals  Patient sees Dr. Elvera Lennox for endocrinology-Last office visit was 07/28/21.  Spoke with patient's mom Bostyn Kunkler via phone and clarified insulin regimen. Insulin doses decreased recently from last office visit due to having hypoglycemic events. Patient uses Dexcom CGM.  Thank you, Billy Fischer. Celita Aron, RN, MSN, CDE  Diabetes Coordinator Inpatient Glycemic Control Team Team Pager [phone] (8am-5pm) 10/12/2021 8:48 AM

## 2021-10-12 NOTE — ED Provider Notes (Signed)
[facility] Provider Note   CSN: [id number] Arrival date & time: 10/11/21  1856     History Chief Complaint  Patient presents with   Palpitations    Patient has "racing heart" when she stands, today worse than previous days.  EMS: HR: 150 BP: 140/110 CBG: 280  146m NS with EMS    [facility] LONGSWORTHis a 25y.o. female.  HPI     25year old female with a history of type 1 diabetes, gastroparesis, anxiety, who presents with concern for palpitations and elevated heart rate.  She reports that the symptoms have been going on for about 5 weeks, and today felt that things got to a breaking point, that she continue to have persisting elevated heart rate up to the 150s when she would go from sitting to standing, lightheadedness, and shortness of breath, and is not scheduled to see cardiology until the end of November, and felt that with how severe her tachycardia has been that would be too long.  Initially, the symptoms started after a dose increase in her Lamictal after the dose increase she had fatigue, increased anxiety, dizziness, and she tapered off of the medication and stopped it, however her symptoms have continued.  She reports she has been off of it for about 3 weeks with continued tachycardia.  Reports that usually when she is laying down her heart rate is normal, but when she goes from sitting to standing, her heart rate significantly elevates and has been as high as 150.  Reports she has had slow associated shortness of breath and lightheadedness that also comes and goes.  Denies nausea, vomiting, diarrhea, black or bloody stools, recent illness, fever, other new medications or stop medication, glucose has overall been well controlled.  She did start having some heartburn which she will sometimes get with her DKA.  No leg pain or swelling, or history of DVT or PE. Past Medical History:  Diagnosis Date   Anxiety    Depression    Diabetes mellitus  type I ([facility]    Diabetic gastroparesis ([facility]    GERD (gastroesophageal reflux disease)     Patient Active Problem List   Diagnosis Date Noted   Tachycardia 10/12/2021   DKA (diabetic ketoacidosis) ([facility] 10/11/2021   Type I diabetes mellitus, uncontrolled 10/09/2019   Anxiety about health 10/19/2018   Hypoglycemia due to type 1 diabetes mellitus ([facility] 10/19/2018   Mood disorder ([facility] 04/28/2018   Cigarette nicotine dependence without complication 079/39/0300  Diabetic gastroparesis ([facility]    Hirsutism 08/05/2014   Non compliance with medical treatment 06/04/2013   Goiter 06/04/2013   Adjustment reaction 06/04/2013   Generalized anxiety disorder 06/04/2013   ADD (attention deficit disorder) 06/04/2013   PTSD (post-traumatic stress disorder) 06/04/2013   Hyperglycemia 06/03/2013   Uncontrolled type 1 diabetes mellitus with diabetic peripheral neuropathy 10/26/2012    Past Surgical History:  Procedure Laterality Date   NO PAST SURGERIES       OB History   No obstetric history on file.     Family History  Problem Relation Age of Onset   Hypothyroidism Mother    Cancer Mother        appendix cancer   Diabetes Father        type 2   Hypothyroidism Paternal Grandmother    Diabetes Paternal Grandmother        type 2   Diabetes Cousin        type 1  Social History   Tobacco Use   Smoking status: Former    Packs/day: 0.50    Years: 5.00    Pack years: 2.50    Types: Cigarettes   Smokeless tobacco: Never   Tobacco comments:    NA  Vaping Use   Vaping Use: Never used  Substance Use Topics   Alcohol use: Never   Drug use: Never    Home Medications Prior to Admission medications   Medication Sig Start Date End Date Taking? Authorizing Provider  Glucagon 3 MG/DOSE POWD Place 3 mg into the nose once as needed for up to 1 dose. 03/17/21  Yes Philemon Kingdom, MD  ibuprofen (ADVIL) 200 MG tablet Take 200 mg by mouth every 6 (six) hours as needed for headache or  mild pain.   Yes [provider]  Insulin Glargine-yfgn (SEMGLEE, YFGN,) 100 UNIT/ML SOLN Inject 40 Units into the skin daily. 04/23/21  Yes Philemon Kingdom, MD  Insulin Lispro-aabc, 1 U Dial, (LYUMJEV KWIKPEN) 100 UNIT/ML SOPN Inject under kin 4-15 units before meals (3x a day) 09/23/20  Yes Philemon Kingdom, MD  acetone, urine, test strip Check ketones per protocol 03/05/14   Lelon Huh, MD  BAYER MICROLET LANCETS lancets Check sugars 6 times daily and per protocol for hyper and hypoglycemia 03/05/14   Lelon Huh, MD  Blood Glucose Monitoring Suppl (ONETOUCH VERIO) w/Device KIT Use to check blood sugar 4 times a day 10/09/19   Philemon Kingdom, MD  Continuous Blood Gluc Receiver ([place]) Cache For continuous blood glucose monitoring 4 times a day; E10.42 07/04/20   Philemon Kingdom, MD  Continuous Blood Gluc Sensor (DEXCOM G6 SENSOR) MISC For continuous blood glucose monitoring 4 times a day; E10.42 09/15/20   Philemon Kingdom, MD  Continuous Blood Gluc Transmit (DEXCOM G6 TRANSMITTER) MISC USE FOR CONTINUOUS BLOOD GLUCOSE MONITORING FOUR TIMES A DAY 08/31/21   Philemon Kingdom, MD  frovatriptan (FROVA) 2.5 MG tablet Take 1 tablet (2.5 mg total) by mouth as needed for migraine. If recurs, may repeat after 2 hours. Max of 3 tabs in 24 hours. Patient not taking: No sig reported 11/11/20   Emeterio Reeve, DO  glucose blood (ONETOUCH VERIO) test strip Checks blood sugar 4 times daily 01/14/20   Philemon Kingdom, MD  Insulin Pen Needle (INSUPEN PEN NEEDLES) 32G X 4 MM MISC Inject insulin via insulin pen 5 times daily 03/17/21   Philemon Kingdom, MD  promethazine (PHENERGAN) 25 MG tablet Take 1 tablet (25 mg total) by mouth every 8 (eight) hours as needed for nausea or vomiting. Patient not taking: No sig reported 10/18/18   Trixie Dredge, PA-C    Allergies    Levemir [insulin detemir], Augmentin [amoxicillin-pot clavulanate], and Omnicef  [cefdinir]  Review of Systems   Review of Systems  Constitutional:  Positive for fatigue. Negative for fever.  HENT:  Negative for sore throat.   Eyes:  Negative for visual disturbance.  Respiratory:  Positive for shortness of breath. Negative for cough.   Cardiovascular:  Positive for palpitations. Negative for chest pain.  Gastrointestinal:  Negative for abdominal pain, diarrhea, nausea and vomiting.  Genitourinary:  Negative for difficulty urinating.  Musculoskeletal:  Negative for back pain and neck pain.  Skin:  Negative for rash.  Neurological:  Positive for light-headedness. Negative for syncope and headaches.   Physical Exam Updated Vital Signs BP 126/88   Pulse 93   Temp 98.3 F (36.8 C) (Oral)   Resp 19   Ht 5' 7"  (  1.702 m)   Wt 72.6 kg   SpO2 98%   BMI 25.06 kg/m   Physical Exam Vitals and nursing note reviewed.  Constitutional:      General: She is not in acute distress.    Appearance: She is well-developed. She is not diaphoretic.  HENT:     Head: Normocephalic and atraumatic.  Eyes:     Conjunctiva/sclera: Conjunctivae normal.  Cardiovascular:     Rate and Rhythm: Regular rhythm. Tachycardia present.     Heart sounds: Normal heart sounds. No murmur heard.   No friction rub. No gallop.  Pulmonary:     Effort: Pulmonary effort is normal. No respiratory distress.     Breath sounds: Normal breath sounds. No wheezing or rales.  Abdominal:     General: There is no distension.     Palpations: Abdomen is soft.     Tenderness: There is no abdominal tenderness. There is no guarding.  Musculoskeletal:        General: No tenderness.     Cervical back: Normal range of motion.  Skin:    General: Skin is warm and dry.     Findings: No erythema or rash.  Neurological:     Mental Status: She is alert and oriented to person, place, and time.    ED Results / Procedures / Treatments   Labs (all labs ordered are listed, but only abnormal results are  displayed) Labs Reviewed  CBC WITH DIFFERENTIAL/PLATELET - Abnormal; Notable for the following components:      Result Value   WBC 11.1 (*)    RBC 6.16 (*)    Hemoglobin 18.5 (*)    HCT 51.0 (*)    MCHC 36.3 (*)    All other components within normal limits  COMPREHENSIVE METABOLIC PANEL - Abnormal; Notable for the following components:   CO2 15 (*)    Glucose, Bld 280 (*)    AST 11 (*)    Total Bilirubin 2.1 (*)    Anion gap 16 (*)    All other components within normal limits  BETA-HYDROXYBUTYRIC ACID - Abnormal; Notable for the following components:   Beta-Hydroxybutyric Acid 4.70 (*)    All other components within normal limits  CBG MONITORING, ED - Abnormal; Notable for the following components:   Glucose-Capillary 233 (*)    All other components within normal limits  RESP PANEL BY RT-PCR (FLU A&B, COVID) ARPGX2  TSH  T4, FREE  BRAIN NATRIURETIC PEPTIDE  RAPID URINE DRUG SCREEN, HOSP PERFORMED  URINALYSIS, ROUTINE W REFLEX MICROSCOPIC  LACTIC ACID, PLASMA  LACTIC ACID, PLASMA  HIV ANTIBODY (ROUTINE TESTING W REFLEX)  BASIC METABOLIC PANEL  BASIC METABOLIC PANEL  BASIC METABOLIC PANEL  BASIC METABOLIC PANEL  BASIC METABOLIC PANEL  BETA-HYDROXYBUTYRIC ACID  BETA-HYDROXYBUTYRIC ACID  BETA-HYDROXYBUTYRIC ACID  HEMOGLOBIN A1C  MAGNESIUM  I-STAT BETA HCG BLOOD, ED ([facility], [facility], [facility] ONLY)  I-STAT VENOUS BLOOD GAS, ED  TROPONIN I (HIGH SENSITIVITY)  TROPONIN I (HIGH SENSITIVITY)    EKG EKG Interpretation  Date/Time:  Sunday October 11 2021 19:00:35 EST Ventricular Rate:  117 PR Interval:  127 QRS Duration: 68 QT Interval:  292 QTC Calculation: 408 R Axis:   79 Text Interpretation: Sinus tachycardia Biatrial enlargement Borderline repolarization abnormality Since prior ECG, rate has increased Confirmed by Gareth Morgan [id number]) on 10/11/2021 11:16:17 PM  Radiology CT Angio Chest PE W and/or Wo Contrast  Result Date: 10/11/2021 CLINICAL DATA:  Shortness of breath  and dyspnea EXAM: CT ANGIOGRAPHY CHEST  WITH CONTRAST TECHNIQUE: Multidetector CT imaging of the chest was performed using the standard protocol during bolus administration of intravenous contrast. Multiplanar CT image reconstructions and MIPs were obtained to evaluate the vascular anatomy. CONTRAST:  47m OMNIPAQUE IOHEXOL 350 MG/ML SOLN COMPARISON:  None. FINDINGS: Cardiovascular: Satisfactory opacification of the pulmonary arteries to the segmental level. No evidence of pulmonary embolism. Normal heart size. No pericardial effusion. Mediastinum/Nodes: No enlarged mediastinal, hilar, or axillary lymph nodes. Thyroid gland, trachea, and esophagus demonstrate no significant findings. There is a small volume of residual thymus in the substernal space, appears normal for age. Lungs/Pleura: Lungs are clear. No pleural effusion or pneumothorax. Upper Abdomen: No acute abnormality. Musculoskeletal: there is mild thoracic kyphosis. No spinal compression fracture or acute skeletal findings. Review of the MIP images confirms the above findings. IMPRESSION: No acute chest CT or CTA findings. Electronically Signed   By: KTelford NabM.D.   On: 10/11/2021 22:45    Procedures .Critical Care Performed by: SGareth Morgan MD Authorized by: SGareth Morgan MD   Critical care provider statement:    Critical care time (minutes):  30   Critical care was necessary to treat or prevent imminent or life-threatening deterioration of the following conditions:  Endocrine crisis   Critical care was time spent personally by me on the following activities:  Examination of patient, ordering and review of radiographic studies, ordering and review of laboratory studies, ordering and performing treatments and interventions and re-evaluation of patient's condition   Care discussed with: admitting provider     Medications Ordered in ED Medications  lactated ringers bolus 1,452 mL (has no administration in time range)  insulin  regular, human (MYXREDLIN) 100 units/ 100 mL infusion (has no administration in time range)  lactated ringers infusion (has no administration in time range)  dextrose 5 % in lactated ringers infusion (has no administration in time range)  dextrose 50 % solution 0-50 mL (has no administration in time range)  potassium chloride 10 mEq in 100 mL IVPB (has no administration in time range)  enoxaparin (LOVENOX) injection 40 mg (has no administration in time range)  sodium chloride 0.9 % bolus 1,000 mL (0 mLs Intravenous Stopped 10/11/21 2303)  iohexol (OMNIPAQUE) 350 MG/ML injection 65 mL (65 mLs Intravenous Contrast Given 10/11/21 2237)    ED Course  I have reviewed the triage vital signs and the nursing notes.  Pertinent labs & imaging results that were available during my care of the patient were reviewed by me and considered in my medical decision making (see chart for details).    MDM Rules/Calculators/A&P                            25year old female with a history of type 1 diabetes, gastroparesis, anxiety, who presents with concern for palpitations and elevated heart rate.    Labs show hemoglobin elevated to 18, has noted to have slightly elevated numbers in the past, feel increased elevation is likely due to dehydration today.  CT PE study completed shows no evidence of acute PE or other abnormalities.  Troponin is negative and have low suspicion for ACS or myocarditis.  TSH and free T4 within normal limits.  COVID and flu testing negative.  Pregnancy testing negative.  EKG shows sinus tachycardia.    Labs are significant for a bicarb of 15 with an anion gap of 16 and glucose of 280, beta hydroxybutyric acid 4.7.  Findings consistent with DKA.  VBG is ordered and is pending.  Ordered insulin drip, consult to the hospitalist for admission.  Is possible that some of her resting tachycardia today has been secondary to dehydration and diabetic ketoacidosis, however I doubt that DKA explains  her several weeks of orthostatic tachycardia.  Suspect most likely etiology of this is autonomic dysfunction or POTS, however recommend continued outpatient follow-up for this for definitive diagnosis.  Will admit for diabetic ketoacidosis.    Final Clinical Impression(s) / ED Diagnoses Final diagnoses:  Diabetic ketoacidosis without coma associated with type 1 diabetes mellitus ([place])  Sinus tachycardia  Palpitations    Rx / DC Orders ED Discharge Orders     None        Gareth Morgan, MD 10/12/21 0120

## 2021-10-12 NOTE — Discharge Summary (Addendum)
Physician Discharge Summary  Veronica Liu IOE:[id number] DOB: 1996/11/23 DOA: 10/11/2021  PCP: Emeterio Reeve, DO  Admit date: 10/11/2021 Discharge date: 10/12/2021  Admitted From: Home  Disposition:  Home   Recommendations for Outpatient Follow-up and new medication changes:  Follow up with Primary Care as scheduled  Follow up with cardiology as scheduled   I spoke with patient's mother at the bedside, we talked in detail about patient's condition, plan of care and prognosis and all questions were addressed.   Home Health: no   Equipment/Devices: no    Discharge Condition: stable  CODE STATUS: full  Diet recommendation:  diabetic prudent.   Brief/Interim Summary: Veronica Liu was admitted tot he hospital with the working diagnosis of diabetes ketoacidosis.    25 year old female with past medical history for type 1 diabetes mellitus, anxiety, depression, GERD, attention deficit who presented with palpitations and dyspnea.  At home patient reported having dyspnea and palpitations, episodic for about 5 weeks.  More severe symptoms over the last couple days leading to her hospitalization.  On her initial physical examination blood pressure 118/90, heart rate 94, respiratory rate 18, oxygen saturation 99%.  Her lungs were clear to auscultation bilaterally, heart S1-S2, present, rhythmic, abdomen soft nontender, no lower extremity edema.   Sodium 135, potassium 3.7, chloride 104, bicarb 15, glucose 280, BUN 11, creatinine 0.92, anion gap 16.  White count 11.1, hemoglobin 18.5, hematocrit 51.0, platelets 320. SARS COVID-19 negative.   CT chest negative for acute findings.  Negative for pulm embolism.   EKG 117 bpm, normal axis, normal intervals, sinus rhythm, no significant ST segment or T wave changes.  T1DM, ketoacidosis. Patient with no nausea or vomiting, anion gap has been closed to 11 this am with fasting glucose 150 mg/dl.   Patient was transitioned from IV insulin to sq with  good toleration. Patient received 10 untis of glargine. Her discharge capillary glucose is 139.  Plan to take 10 units of insulin glargine tonight as long as capillary glucose less than 200. In case above glucose is above 200, will get 30 units tonight. Tomorrow to resume 40 units daily as per usual regimen. Continue with insulin sliding scale for glucose cover and monitoring.    2. Tachycardia. HR 86 and 77 this am. EKG sinus rhythm. Old records personally reviewed echocardiogram from 2021 had normal LV systolic function with EF 60 to 18%, RV systolic function preserved. No valvular disease.    Suspected reactive tachycardia, possible panic attacks at home. Plan to continue telemetry monitoring during her hospitalization. Adequate volume resuscitation.  In the differential autonomic dysfunction related to T1DM.    3. Hyponatremia and hypokalemia, hypomagnesemia.  Renal function with serum cr at 0,62, K is 3.4 and Na 134 with bicarbonate at 16 and Cl 107. Anion gap 11 .   Add 40 meq Kcl and 2 g mag sulfate this am.   4. Anxiety and depression. Patient off psychotropic medications for the past 3 months. Has outpatient psych follow up. Had adverse reaction to alprazolam in the past.    5. GERD. Continue with antiacid therapy with pantoprazole.   Discharge Diagnoses:  Principal Problem:   DKA (diabetic ketoacidosis) ([place]) Active Problems:   Generalized anxiety disorder   ADD (attention deficit disorder)   PTSD (post-traumatic stress disorder)   Type 1 diabetes mellitus with other specified complication (HCC)   Tachycardia    Discharge Instructions  Discharge Instructions     Diet - low sodium heart healthy   Complete  by: As directed    Discharge instructions   Complete by: As directed    Please continue checking blood sugars, if continue to be below 200, please give 10 units glargine tonight and tomorrow resume usual 40 units daily. If blood sugar today more than 200, please  give tonight 30 units, and tomorrow resume 40 units as usual.  Follow up with primary care as scheduled.   Increase activity slowly   Complete by: As directed       Allergies as of 10/12/2021       Reactions   Levemir [insulin Detemir]    Hives at injection sites   Augmentin [amoxicillin-pot Clavulanate]    Vomiting as young child   Omnicef [cefdinir]    Vomiting as young child.        Medication List     STOP taking these medications    frovatriptan 2.5 MG tablet Commonly known as: Frova   promethazine 25 MG tablet Commonly known as: PHENERGAN       TAKE these medications    acetone (urine) test strip Check ketones per protocol   Bayer Microlet Lancets lancets Check sugars 6 times daily and per protocol for hyper and hypoglycemia   Dexcom G6 Receiver Devi For continuous blood glucose monitoring 4 times a day; E10.42   Dexcom G6 Sensor Misc For continuous blood glucose monitoring 4 times a day; E10.42   Dexcom G6 Transmitter Misc USE FOR CONTINUOUS BLOOD GLUCOSE MONITORING FOUR TIMES A DAY   Glucagon 3 MG/DOSE Powd Place 3 mg into the nose once as needed for up to 1 dose.   ibuprofen 200 MG tablet Commonly known as: ADVIL Take 200 mg by mouth every 6 (six) hours as needed for headache or mild pain.   insulin glargine-yfgn 100 UNIT/ML injection Commonly known as: Semglee (yfgn) Inject 40 Units into the skin daily.   Insupen Pen Needles 32G X 4 MM Misc Generic drug: Insulin Pen Needle Inject insulin via insulin pen 5 times daily   Lyumjev KwikPen 100 UNIT/ML KwikPen Generic drug: Insulin Lispro-aabc Inject under kin 4-15 units before meals (3x a day)   OneTouch Verio test strip Generic drug: glucose blood Checks blood sugar 4 times daily   OneTouch Verio w/Device Kit Use to check blood sugar 4 times a day   pantoprazole 40 MG tablet Commonly known as: PROTONIX Take 1 tablet (40 mg total) by mouth daily for 7 days. Start taking on: October 13, 2021        Allergies  Allergen Reactions   Levemir [Insulin Detemir]     Hives at injection sites   Augmentin [Amoxicillin-Pot Clavulanate]     Vomiting as young child   Omnicef [Cefdinir]     Vomiting as young child.      Procedures/Studies: CT Angio Chest PE W and/or Wo Contrast  Result Date: 10/11/2021 CLINICAL DATA:  Shortness of breath and dyspnea EXAM: CT ANGIOGRAPHY CHEST WITH CONTRAST TECHNIQUE: Multidetector CT imaging of the chest was performed using the standard protocol during bolus administration of intravenous contrast. Multiplanar CT image reconstructions and MIPs were obtained to evaluate the vascular anatomy. CONTRAST:  24m OMNIPAQUE IOHEXOL 350 MG/ML SOLN COMPARISON:  None. FINDINGS: Cardiovascular: Satisfactory opacification of the pulmonary arteries to the segmental level. No evidence of pulmonary embolism. Normal heart size. No pericardial effusion. Mediastinum/Nodes: No enlarged mediastinal, hilar, or axillary lymph nodes. Thyroid gland, trachea, and esophagus demonstrate no significant findings. There is a small volume of residual thymus in the  substernal space, appears normal for age. Lungs/Pleura: Lungs are clear. No pleural effusion or pneumothorax. Upper Abdomen: No acute abnormality. Musculoskeletal: there is mild thoracic kyphosis. No spinal compression fracture or acute skeletal findings. Review of the MIP images confirms the above findings. IMPRESSION: No acute chest CT or CTA findings. Electronically Signed   By: Telford Nab M.D.   On: 10/11/2021 22:45     Subjective: Patient with no nausea or vomiting, not feeling well in general, not back to her baseline, no chest pain or dyspnea  Discharge Exam: Vitals:   10/12/21 1100 10/12/21 1200  BP: 113/84 (!) 88/66  Pulse: 83 82  Resp: 16 17  Temp:    SpO2: 100% 100%   Vitals:   10/12/21 0900 10/12/21 1000 10/12/21 1100 10/12/21 1200  BP: 103/77 103/62 113/84 (!) 88/66  Pulse: 100 88 83 82   Resp: _0 Temp:      TempSrc:      SpO2: 100% 100% 100% 100%  Weight:      Height:        General: Not in pain or dyspnea  Neurology: Awake and alert, non focal  E ENT: no pallor, no icterus, oral mucosa moist Cardiovascular: No JVD. S1-S2 present, rhythmic, no gallops, rubs, or murmurs. No lower extremity edema. Pulmonary: positive breath sounds bilaterally, adequate air movement, no wheezing, rhonchi or rales. Gastrointestinal. Abdomen soft and non tender Skin. No rashes Musculoskeletal: no joint deformities  The results of significant diagnostics from this hospitalization (including imaging, microbiology, ancillary and laboratory) are listed below for reference.     Microbiology: Recent Results (from the past 240 hour(s))  Resp Panel by RT-PCR (Flu A&B, Covid) Nasopharyngeal Swab     Status: None   Collection Time: 10/11/21  9:12 PM   Specimen: Nasopharyngeal Swab; Nasopharyngeal(NP) swabs in vial transport medium  Result Value Ref Range Status   SARS Coronavirus 2 by RT PCR NEGATIVE NEGATIVE Final    Comment: (NOTE) SARS-CoV-2 target nucleic acids are NOT DETECTED.  The SARS-CoV-2 RNA is generally detectable in upper respiratory specimens during the acute phase of infection. The lowest concentration of SARS-CoV-2 viral copies this assay can detect is 138 copies/mL. A negative result does not preclude SARS-Cov-2 infection and should not be used as the sole basis for treatment or other patient management decisions. A negative result may occur with  improper specimen collection/handling, submission of specimen other than nasopharyngeal swab, presence of viral mutation(s) within the areas targeted by this assay, and inadequate number of viral copies(<138 copies/mL). A negative result must be combined with clinical observations, patient history, and epidemiological information. The expected result is Negative.  Fact Sheet for Patients:   EntrepreneurPulse.com.au  Fact Sheet for Healthcare Providers:  IncredibleEmployment.be  This test is no t yet approved or cleared by the [place] FDA and  has been authorized for detection and/or diagnosis of SARS-CoV-2 by FDA under an Emergency Use Authorization (EUA). This EUA will remain  in effect (meaning this test can be used) for the duration of the COVID-19 declaration under Section 564(b)(1) of the Act, 21 U.S.C.section 360bbb-3(b)(1), unless the authorization is terminated  or revoked sooner.       Influenza A by PCR NEGATIVE NEGATIVE Final   Influenza B by PCR NEGATIVE NEGATIVE Final    Comment: (NOTE) The Xpert Xpress SARS-CoV-2/FLU/RSV plus assay is intended as an aid in the diagnosis of influenza from Nasopharyngeal swab specimens and should not be used as a sole  basis for treatment. Nasal washings and aspirates are unacceptable for Xpert Xpress SARS-CoV-2/FLU/RSV testing.  Fact Sheet for Patients: EntrepreneurPulse.com.au  Fact Sheet for Healthcare Providers: IncredibleEmployment.be  This test is not yet approved or cleared by the [place] FDA and has been authorized for detection and/or diagnosis of SARS-CoV-2 by FDA under an Emergency Use Authorization (EUA). This EUA will remain in effect (meaning this test can be used) for the duration of the COVID-19 declaration under Section 564(b)(1) of the Act, 21 U.S.C. section 360bbb-3(b)(1), unless the authorization is terminated or revoked.  Performed at [facility], [place] [address] [place], [place] [address]      Labs: BNP (last 3 results) Recent Labs    10/11/21 2112  BNP 5.0   Basic Metabolic Panel: Recent Labs  Lab 10/11/21 2112 10/12/21 0134 10/12/21 0318 10/12/21 0353  NA 135 134* 139 134*  K 3.7 3.4* 4.1 3.4*  CL 104 106  --  107  CO2 15* 16*  --  16*  GLUCOSE 280* 198*  --  150*  BUN 11 9  --  8   CREATININE 0.92 0.74  --  0.62  CALCIUM 9.2 8.7*  --  8.7*  MG  --  1.8  --   --    Liver Function Tests: Recent Labs  Lab 10/11/21 2112  AST 11*  ALT 12  ALKPHOS 59  BILITOT 2.1*  PROT 7.5  ALBUMIN 4.2   No results for input(s): LIPASE, AMYLASE in the last 168 hours. No results for input(s): AMMONIA in the last 168 hours. CBC: Recent Labs  Lab 10/11/21 2112 10/12/21 0318  WBC 11.1*  --   NEUTROABS 6.8  --   HGB 18.5* 14.3  HCT 51.0* 42.0  MCV 82.8  --   PLT 320  --    Cardiac Enzymes: No results for input(s): CKTOTAL, CKMB, CKMBINDEX, TROPONINI in the last 168 hours. BNP: Invalid input(s): POCBNP CBG: Recent Labs  Lab 10/12/21 0649 10/12/21 0856 10/12/21 1011 10/12/21 1116 10/12/21 1223  GLUCAP 147* 139* 118* 137* 139*   D-Dimer No results for input(s): DDIMER in the last 72 hours. Hgb A1c Recent Labs    10/12/21 0134  HGBA1C 8.6*   Lipid Profile No results for input(s): CHOL, HDL, LDLCALC, TRIG, CHOLHDL, LDLDIRECT in the last 72 hours. Thyroid function studies Recent Labs    10/11/21 2112  TSH 1.086   Anemia work up No results for input(s): VITAMINB12, FOLATE, FERRITIN, TIBC, IRON, RETICCTPCT in the last 72 hours. Urinalysis    Component Value Date/Time   COLORURINE YELLOW 06/03/2013 0748   APPEARANCEUR CLOUDY (A) 06/03/2013 0748   LABSPEC 1.031 (H) 06/03/2013 0748   PHURINE 6.0 06/03/2013 0748   GLUCOSEU >1000 (A) 06/03/2013 0748   HGBUR LARGE (A) 06/03/2013 0748   BILIRUBINUR NEGATIVE 06/03/2013 0748   KETONESUR NEGATIVE 06/05/2013 2309   PROTEINUR NEGATIVE 06/03/2013 0748   UROBILINOGEN 0.2 06/03/2013 0748   NITRITE NEGATIVE 06/03/2013 0748   LEUKOCYTESUR TRACE (A) 06/03/2013 0748   Sepsis Labs Invalid input(s): PROCALCITONIN,  WBC,  LACTICIDVEN Microbiology Recent Results (from the past 240 hour(s))  Resp Panel by RT-PCR (Flu A&B, Covid) Nasopharyngeal Swab     Status: None   Collection Time: 10/11/21  9:12 PM   Specimen:  Nasopharyngeal Swab; Nasopharyngeal(NP) swabs in vial transport medium  Result Value Ref Range Status   SARS Coronavirus 2 by RT PCR NEGATIVE NEGATIVE Final    Comment: (NOTE) SARS-CoV-2 target nucleic acids are NOT DETECTED.  The SARS-CoV-2 RNA is generally detectable in upper respiratory specimens during the acute phase of infection. The lowest concentration of SARS-CoV-2 viral copies this assay can detect is 138 copies/mL. A negative result does not preclude SARS-Cov-2 infection and should not be used as the sole basis for treatment or other patient management decisions. A negative result may occur with  improper specimen collection/handling, submission of specimen other than nasopharyngeal swab, presence of viral mutation(s) within the areas targeted by this assay, and inadequate number of viral copies(<138 copies/mL). A negative result must be combined with clinical observations, patient history, and epidemiological information. The expected result is Negative.  Fact Sheet for Patients:  EntrepreneurPulse.com.au  Fact Sheet for Healthcare Providers:  IncredibleEmployment.be  This test is no t yet approved or cleared by the [place] FDA and  has been authorized for detection and/or diagnosis of SARS-CoV-2 by FDA under an Emergency Use Authorization (EUA). This EUA will remain  in effect (meaning this test can be used) for the duration of the COVID-19 declaration under Section 564(b)(1) of the Act, 21 U.S.C.section 360bbb-3(b)(1), unless the authorization is terminated  or revoked sooner.       Influenza A by PCR NEGATIVE NEGATIVE Final   Influenza B by PCR NEGATIVE NEGATIVE Final    Comment: (NOTE) The Xpert Xpress SARS-CoV-2/FLU/RSV plus assay is intended as an aid in the diagnosis of influenza from Nasopharyngeal swab specimens and should not be used as a sole basis for treatment. Nasal washings and aspirates are unacceptable for  Xpert Xpress SARS-CoV-2/FLU/RSV testing.  Fact Sheet for Patients: EntrepreneurPulse.com.au  Fact Sheet for Healthcare Providers: IncredibleEmployment.be  This test is not yet approved or cleared by the [place] FDA and has been authorized for detection and/or diagnosis of SARS-CoV-2 by FDA under an Emergency Use Authorization (EUA). This EUA will remain in effect (meaning this test can be used) for the duration of the COVID-19 declaration under Section 564(b)(1) of the Act, 21 U.S.C. section 360bbb-3(b)(1), unless the authorization is terminated or revoked.  Performed at [facility], [place] [address], [place], [place] [address]      Time coordinating discharge: 45 minutes  SIGNED:   Tawni Millers, MD  [facility] 10/12/2021, 12:57 PM

## 2021-10-12 NOTE — Progress Notes (Addendum)
PROGRESS NOTE    Veronica Liu  KXF:[id number] DOB: [birth year] DOA: 10/11/2021 PCP: Veronica Nielsen, DO    Brief Narrative:  Veronica Liu was admitted tot he hospital with the working diagnosis of diabetes ketoacidosis.   25 year old female with past medical history for type 1 diabetes mellitus, anxiety, depression, GERD, attention deficit who presented with palpitations and dyspnea.  At home patient reported having dyspnea and palpitations, episodic for about 5 weeks.  More severe symptoms over the last couple days leading to her hospitalization.  On her initial physical examination blood pressure 118/90, heart rate 94, respiratory rate 18, oxygen saturation 99%.  Her lungs are clear to auscultation bilaterally, heart S1-S2, present, rhythmic, abdomen soft nontender, no lower extremity edema.  Sodium 135, potassium 3.7, chloride 104, bicarb 15, glucose 280, BUN 11, creatinine 0.92, anion gap 16.  White count 11.1, hemoglobin 18.5, hematocrit 51.0, platelets 320. SARS COVID-19 negative.  CT chest negative for acute findings.  Negative for pulm embolism.  EKG 117 bpm, normal axis, normal intervals, sinus rhythm, no significant ST segment or T wave changes.  Assessment & Plan:   Principal Problem:   DKA (diabetic ketoacidosis) (HCC) Active Problems:   Generalized anxiety disorder   ADD (attention deficit disorder)   PTSD (post-traumatic stress disorder)   Type 1 diabetes mellitus with other specified complication (HCC)   Tachycardia   T1DM, ketoacidosis. Patient with no nausea or vomiting, anion gap has been closed to 11 this am with fasting glucose 150 mg/dl.  Plan to transition to sq insulin, 10 units now and continue glucose cover and monitoring with insulin sliding scale. At home patient taking 40 units glargine per day. Once of insulin drip and patient tolerating po well, will dc IV fluids.   2. Tachycardia. HR 86 and 77 this am. EKG sinus rhythm. Old records personally  reviewed echocardiogram from 2021 had normal LV systolic function with EF 60 to 65%, RV systolic function preserved. No valvular disease.   Suspected reactive tachycardia, possible panic attacks at home. Plan to continue telemetry monitoring during her hospitalization. Adequate volume resuscitation.  In the differential autonomic dysfunction related to T1DM.   3. Hyponatremia and hypokalemia, hypomagnesemia.  Renal function with serum cr at 0,62, K is 3.4 and Na 134 with bicarbonate at 16 and Cl 107. Anion gap 11 .  Plan to add 40 meq Kcl and 2 g mag sulfate this am, and recheck electrolytes this pm.   4. Anxiety and depression. Patient off psychotropic medications for the past 3 months. Has outpatient psych follow up. Had adverse reaction to alprazolam in the past.   5. GERD. Continue with antiacid therapy with pantoprazole.    Patient continue to be at high risk for worsening ketosis   Status is: Observation  The patient remains OBS appropriate and will d/c before 2 midnights.    DVT prophylaxis: Enoxaparin   Code Status:    full       Subjective: Patient with no nausea or vomiting, not feeling well in general, not back to her baseline, no chest pain or dyspnea,   Objective: Vitals:   10/12/21 0400 10/12/21 0500 10/12/21 0700 10/12/21 0800  BP: 96/66 107/67 99/70 95/63   Pulse: 84 86 86 77  Resp: 15 12 14  (!) 21  Temp:      TempSrc:      SpO2: 98% 99% 100% 98%  Weight:      Height:        Intake/Output Summary (Last 24 hours)  at 10/12/2021 0842 Last data filed at 10/12/2021 0349 Gross per 24 hour  Intake 1100 ml  Output --  Net 1100 ml   Filed Weights   10/11/21 1945  Weight: 72.6 kg    Examination:   General: Not in pain or dyspnea  Neurology: Awake and alert, non focal  E ENT: no pallor, no icterus, oral mucosa moist Cardiovascular: No JVD. S1-S2 present, rhythmic, no gallops, rubs, or murmurs. No lower extremity edema. Pulmonary: positive breath  sounds bilaterally, adequate air movement, no wheezing, rhonchi or rales. Gastrointestinal. Abdomen soft and non tender Skin. No rashes Musculoskeletal: no joint deformities     Data Reviewed: I have personally reviewed following labs and imaging studies  CBC: Recent Labs  Lab 10/11/21 2112 10/12/21 0318  WBC 11.1*  --   NEUTROABS 6.8  --   HGB 18.5* 14.3  HCT 51.0* 42.0  MCV 82.8  --   PLT 320  --    Basic Metabolic Panel: Recent Labs  Lab 10/11/21 2112 10/12/21 0134 10/12/21 0318 10/12/21 0353  NA 135 134* 139 134*  K 3.7 3.4* 4.1 3.4*  CL 104 106  --  107  CO2 15* 16*  --  16*  GLUCOSE 280* 198*  --  150*  BUN 11 9  --  8  CREATININE 0.92 0.74  --  0.62  CALCIUM 9.2 8.7*  --  8.7*  MG  --  1.8  --   --    GFR: Estimated Creatinine Clearance: 105.4 mL/min (by C-G formula based on SCr of 0.62 mg/dL). Liver Function Tests: Recent Labs  Lab 10/11/21 2112  AST 11*  ALT 12  ALKPHOS 59  BILITOT 2.1*  PROT 7.5  ALBUMIN 4.2   No results for input(s): LIPASE, AMYLASE in the last 168 hours. No results for input(s): AMMONIA in the last 168 hours. Coagulation Profile: No results for input(s): INR, PROTIME in the last 168 hours. Cardiac Enzymes: No results for input(s): CKTOTAL, CKMB, CKMBINDEX, TROPONINI in the last 168 hours. BNP (last 3 results) No results for input(s): PROBNP in the last 8760 hours. HbA1C: Recent Labs    10/12/21 0134  HGBA1C 8.6*   CBG: Recent Labs  Lab 10/12/21 0242 10/12/21 0345 10/12/21 0444 10/12/21 0548 10/12/21 0649  GLUCAP 149* 126* 154* 171* 147*   Lipid Profile: No results for input(s): CHOL, HDL, LDLCALC, TRIG, CHOLHDL, LDLDIRECT in the last 72 hours. Thyroid Function Tests: Recent Labs    10/11/21 2112  TSH 1.086  FREET4 0.99   Anemia Panel: No results for input(s): VITAMINB12, FOLATE, FERRITIN, TIBC, IRON, RETICCTPCT in the last 72 hours.    Radiology Studies: I have reviewed all of the imaging during  this hospital visit personally     Scheduled Meds:  enoxaparin (LOVENOX) injection  40 mg Subcutaneous Q24H   insulin aspart  0-9 Units Subcutaneous TID WC   insulin glargine-yfgn  10 Units Subcutaneous Daily   Continuous Infusions:  dextrose 5% lactated ringers 125 mL/hr at 10/12/21 0158   insulin 1.6 Units/hr (10/12/21 0652)     LOS: 0 days        Haeleigh Streiff Annett Gula, MD

## 2021-10-26 NOTE — Progress Notes (Signed)
Referring-Natalie Alexander Reason for referral-tachycardia and dyspnea  HPI: 25 year old female for evaluation of dyspnea and tachycardia at request of Emeterio Reeve, DO.  Echocardiogram February 2021 showed normal LV function.  CTA November 2022 showed no pulmonary embolus.  Laboratories November 2022 showed normal BNP, normal troponin, TSH 1.086, free T4 0.99; hgb 18.5.  Patient states that for the past 2 months that she has had dizziness.  This is more frequent with standing and can occur both immediately following standing and after standing for extended periods.  She also can have some dizziness with lying.  She feels her heart pounding per tickly with standing.  She has not had syncope.  There is no chest pain, orthopnea, PND or pedal edema.  She was seen in the emergency room November 6 with the above complaints and was noted to be in DKA as well.  Cardiology now asked to evaluate.  Current Outpatient Medications  Medication Sig Dispense Refill   acetone, urine, test strip Check ketones per protocol 50 each 3   BAYER MICROLET LANCETS lancets Check sugars 6 times daily and per protocol for hyper and hypoglycemia 250 each 3   Blood Glucose Monitoring Suppl (ONETOUCH VERIO) w/Device KIT Use to check blood sugar 4 times a day     Continuous Blood Gluc Receiver (DEXCOM G6 RECEIVER) DEVI For continuous blood glucose monitoring 4 times a day; E10.42 1 each 0   Continuous Blood Gluc Sensor (DEXCOM G6 SENSOR) MISC USE FOR CONTINUOUS BLOOD GLUCOSE MONITORING FOUR TIMES A DAY 9 each 3   Continuous Blood Gluc Transmit (DEXCOM G6 TRANSMITTER) MISC USE FOR CONTINUOUS BLOOD GLUCOSE MONITORING FOUR TIMES A DAY 1 each 3   Glucagon 3 MG/DOSE POWD Place 3 mg into the nose once as needed for up to 1 dose. 1 each 11   glucose blood (ONETOUCH VERIO) test strip Checks blood sugar 4 times daily 400 each 11   ibuprofen (ADVIL) 200 MG tablet Take 200 mg by mouth every 6 (six) hours as needed for headache or  mild pain.     Insulin Glargine-yfgn (SEMGLEE, YFGN,) 100 UNIT/ML SOLN Inject 40 Units into the skin daily. 45 mL 1   Insulin Lispro-aabc (LYUMJEV KWIKPEN) 100 UNIT/ML KwikPen INJECT UNDER THE SKIN 4 TO 15 UNITS BEFORE MEALS (THREE TIMES A DAY) 45 mL 3   Insulin Pen Needle (INSUPEN PEN NEEDLES) 32G X 4 MM MISC Inject insulin via insulin pen 5 times daily 500 each 3   No current facility-administered medications for this visit.    Allergies  Allergen Reactions   Levemir [Insulin Detemir]     Hives at injection sites   Augmentin [Amoxicillin-Pot Clavulanate]     Vomiting as young child   Omnicef [Cefdinir]     Vomiting as young child.     Past Medical History:  Diagnosis Date   Anxiety    Depression    Diabetes mellitus type I ([place])    Diabetic gastroparesis ([place])    GERD (gastroesophageal reflux disease)     Past Surgical History:  Procedure Laterality Date   NO PAST SURGERIES      Social History   Socioeconomic History   Marital status: Single    Spouse name: Not on file   Number of children: 0   Years of education: Not on file   Highest education level: Not on file  Occupational History   Not on file  Tobacco Use   Smoking status: Former    Packs/day: 0.50  Years: 5.00    Pack years: 2.50    Types: Cigarettes   Smokeless tobacco: Never   Tobacco comments:    NA  Vaping Use   Vaping Use: Never used  Substance and Sexual Activity   Alcohol use: Never   Drug use: Never   Sexual activity: Yes    Birth control/protection: None, Condom  Other Topics Concern   Not on file  Social History Narrative   Lives with mom, step-dad, brother, and sister. Sees bio-dad twice monthly.    Social Determinants of Health   Financial Resource Strain: Not on file  Food Insecurity: Not on file  Transportation Needs: Not on file  Physical Activity: Not on file  Stress: Not on file  Social Connections: Not on file  Intimate Partner Violence: Not on file    Family  History  Problem Relation Age of Onset   Hypothyroidism Mother    Cancer Mother        appendix cancer   Diabetes Father        type 2   Hypothyroidism Paternal Grandmother    Diabetes Paternal Grandmother        type 2   Diabetes Cousin        type 1    ROS: no fevers or chills, productive cough, hemoptysis, dysphasia, odynophagia, melena, hematochezia, dysuria, hematuria, rash, seizure activity, orthopnea, PND, pedal edema, claudication. Remaining systems are negative.  Physical Exam:   Blood pressure (!) 152/98, pulse (!) 111, height 5' 7"  (1.702 m), weight 180 lb 6.4 oz (81.8 kg), SpO2 98 %.  General:  Well developed/well nourished in NAD Skin warm/dry, tattoos noted Patient not depressed No peripheral clubbing Back-normal HEENT-normal/normal eyelids Neck supple/normal carotid upstroke bilaterally; no bruits; no JVD; no thyromegaly chest - CTA/ normal expansion CV - RRR/normal S1 and S2; no murmurs, rubs or gallops;  PMI nondisplaced Abdomen -NT/ND, no HSM, no mass, + bowel sounds, no bruit 2+ femoral pulses, no bruits Ext-no edema, chords, 2+ DP Neuro-grossly nonfocal  ECG -October 11, 2021 sinus tachycardia, biatrial enlargement, nonspecific ST changes personally reviewed  Today's electrocardiogram shows sinus tachycardia at a rate of 111, normal axis, nonspecific ST changes.  A/P  1 dyspnea-patient noted to have some dyspnea on exertion.  We will arrange echocardiogram to reassess LV function.  2 dizziness/tachycardia-etiology unclear.  Symptoms do not sound necessarily orthostatic mediated.  As outlined above we will arrange an echocardiogram to assess LV function.  I will arrange a 7-day Zio patch to further assess her rhythm disturbance.  Note TSH is normal.  3 diabetes mellitus-management per primary care.  Kirk Ruths, MD

## 2021-10-31 ENCOUNTER — Other Ambulatory Visit: Payer: Self-pay | Admitting: Internal Medicine

## 2021-10-31 DIAGNOSIS — E1065 Type 1 diabetes mellitus with hyperglycemia: Secondary | ICD-10-CM

## 2021-11-04 ENCOUNTER — Encounter: Payer: Self-pay | Admitting: Cardiology

## 2021-11-04 ENCOUNTER — Ambulatory Visit (INDEPENDENT_AMBULATORY_CARE_PROVIDER_SITE_OTHER): Payer: BC Managed Care – PPO | Admitting: Cardiology

## 2021-11-04 ENCOUNTER — Ambulatory Visit (INDEPENDENT_AMBULATORY_CARE_PROVIDER_SITE_OTHER): Payer: BC Managed Care – PPO

## 2021-11-04 ENCOUNTER — Other Ambulatory Visit: Payer: Self-pay

## 2021-11-04 VITALS — BP 152/98 | HR 111 | Ht 67.0 in | Wt 180.4 lb

## 2021-11-04 DIAGNOSIS — R42 Dizziness and giddiness: Secondary | ICD-10-CM | POA: Diagnosis not present

## 2021-11-04 DIAGNOSIS — R000 Tachycardia, unspecified: Secondary | ICD-10-CM | POA: Diagnosis not present

## 2021-11-04 NOTE — Progress Notes (Unsigned)
Enrolled patient for a 7 day Zio XT monitor to be mailed to patients home.  

## 2021-11-04 NOTE — Patient Instructions (Addendum)
Testing/Procedures:  Your physician has requested that you have an echocardiogram. Echocardiography is a painless test that uses sound waves to create images of your heart. It provides your doctor with information about the size and shape of your heart and how well your heart's chambers and valves are working. This procedure takes approximately one hour. There are no restrictions for this procedure. [address]-[place]  ZIO XT- Long Term Monitor Instructions  Your physician has requested you wear a ZIO patch monitor for 7 days.  This is a single patch monitor. [employer] supplies one patch monitor per enrollment. Additional stickers are not available. Please do not apply patch if you will be having a Nuclear Stress Test,  Echocardiogram, Cardiac CT, MRI, or Chest Xray during the period you would be wearing the  monitor. The patch cannot be worn during these tests. You cannot remove and re-apply the  ZIO XT patch monitor.  Your ZIO patch monitor will be mailed 3 day USPS to your address on file. It may take 3-5 days  to receive your monitor after you have been enrolled.  Once you have received your monitor, please review the enclosed instructions. Your monitor  has already been registered assigning a specific monitor serial # to you.  Billing and Patient Assistance Program Information  We have supplied [employer] with any of your insurance information on file for billing purposes. [employer] offers a sliding scale Patient Assistance Program for patients that do not have  insurance, or whose insurance does not completely cover the cost of the ZIO monitor.  You must apply for the Patient Assistance Program to qualify for this discounted rate.  To apply, please call [employer] at [phone], select option 4, select option 2, ask to apply for  Patient Assistance Program. Meredeth Ide will ask your household income, and how many people  are in your household. They will quote your out-of-pocket  cost based on that information.  [employer] will also be able to set up a 48-month, interest-free payment plan if needed.  Applying the monitor   Shave hair from upper left chest.  Hold abrader disc by orange tab. Rub abrader in 40 strokes over the upper left chest as  indicated in your monitor instructions.  Clean area with 4 enclosed alcohol pads. Let dry.  Apply patch as indicated in monitor instructions. Patch will be placed under collarbone on left  side of chest with arrow pointing upward.  Rub patch adhesive wings for 2 minutes. Remove white label marked "1". Remove the white  label marked "2". Rub patch adhesive wings for 2 additional minutes.  While looking in a mirror, press and release button in center of patch. A small green light will  flash 3-4 times. This will be your only indicator that the monitor has been turned on.  Do not shower for the first 24 hours. You may shower after the first 24 hours.  Press the button if you feel a symptom. You will hear a small click. Record Date, Time and  Symptom in the Patient Logbook.  When you are ready to remove the patch, follow instructions on the last 2 pages of Patient  Logbook. Stick patch monitor onto the last page of Patient Logbook.  Place Patient Logbook in the blue and white box. Use locking tab on box and tape box closed  securely. The blue and white box has prepaid postage on it. Please place it in the mailbox as  soon as possible. Your physician should have your  test results approximately 7 days after the  monitor has been mailed back to [facility].  Call [facility] Customer Care at [phone] if you have questions regarding  your ZIO XT patch monitor. Call them immediately if you see an orange light blinking on your  monitor.  If your monitor falls off in less than 4 days, contact our Monitor department at [phone].  If your monitor becomes loose or falls off after 4 days call [employer] at [phone] for   suggestions on securing your monitor    Follow-Up: At [facility], you and your health needs are our priority.  As part of our continuing mission to provide you with exceptional heart care, we have created designated Provider Care Teams.  These Care Teams include your primary Cardiologist (physician) and Advanced Practice Providers (APPs -  Physician Assistants and Nurse Practitioners) who all work together to provide you with the care you need, when you need it.  We recommend signing up for the patient portal called "MyChart".  Sign up information is provided on this After Visit Summary.  MyChart is used to connect with patients for Virtual Visits (Telemedicine).  Patients are able to view lab/test results, encounter notes, upcoming appointments, etc.  Non-urgent messages can be sent to your provider as well.   To learn more about what you can do with MyChart, go to [email or web address].    Your next appointment:   8 week(s)  The format for your next appointment:   In Person  Provider:   Olga Millers MD

## 2021-11-09 DIAGNOSIS — R000 Tachycardia, unspecified: Secondary | ICD-10-CM

## 2021-11-25 ENCOUNTER — Ambulatory Visit (HOSPITAL_COMMUNITY): Payer: BC Managed Care – PPO | Attending: Cardiology

## 2021-11-25 ENCOUNTER — Other Ambulatory Visit: Payer: Self-pay

## 2021-11-25 DIAGNOSIS — R0609 Other forms of dyspnea: Secondary | ICD-10-CM

## 2021-11-25 DIAGNOSIS — R000 Tachycardia, unspecified: Secondary | ICD-10-CM | POA: Diagnosis not present

## 2021-11-26 LAB — ECHOCARDIOGRAM COMPLETE
Area-P 1/2: 3.3 cm2
S' Lateral: 2.4 cm

## 2021-12-03 ENCOUNTER — Telehealth: Payer: Self-pay

## 2021-12-03 NOTE — Telephone Encounter (Signed)
Called and LVM to advise pt we have a few available appt tomorrow if she wants to move her appointment up.

## 2021-12-04 ENCOUNTER — Ambulatory Visit: Payer: BC Managed Care – PPO | Admitting: Internal Medicine

## 2021-12-04 ENCOUNTER — Ambulatory Visit (INDEPENDENT_AMBULATORY_CARE_PROVIDER_SITE_OTHER): Payer: BC Managed Care – PPO | Admitting: Internal Medicine

## 2021-12-04 ENCOUNTER — Other Ambulatory Visit: Payer: Self-pay

## 2021-12-04 ENCOUNTER — Encounter: Payer: Self-pay | Admitting: Internal Medicine

## 2021-12-04 VITALS — BP 160/60 | HR 129 | Ht 67.0 in | Wt 178.8 lb

## 2021-12-04 DIAGNOSIS — E663 Overweight: Secondary | ICD-10-CM

## 2021-12-04 DIAGNOSIS — E1065 Type 1 diabetes mellitus with hyperglycemia: Secondary | ICD-10-CM | POA: Diagnosis not present

## 2021-12-04 NOTE — Patient Instructions (Addendum)
Please change: - Semglee              35 units in am             20 units at bedtime - Lyumjev: 18-24 units before dinner 18-20 units before lunch  Continue the same sliding scale: Target 100 ISF 20   Please return in 3-4 months.

## 2021-12-04 NOTE — Progress Notes (Signed)
Patient ID: [facility], female   DOB: 1996-01-09, 25 y.o.   MRN: [id number]  This visit occurred during the SARS-CoV-2 public health emergency.  Safety protocols were in place, including screening questions prior to the visit, additional usage of staff PPE, and extensive cleaning of exam room while observing appropriate contact time as indicated for disinfecting solutions.   HPI: [facility] is a 25 y.o.-year-old female, returning for follow-up for DM1, dx'ed at 25 y/o as DM2, and DM1 at 25 y/o, on insulin since 25 y/o uncontrolled, with long-term complications (PN, gastoparesis).  She was previously followed by Dr. [facility] and then [facility] with [facility]inology at [facility].  Last visit with 4 months ago.  Interim history: Patient describes that her right after her birthday in [birth year], after panic attack, she started to feel very dizzy and feel very poorly.  Her blood pressure started to fluctuate significantly and she also started to have nausea, abdominal pain, anxiety, palpitations, shortness of breath, chest pain.  She also has blurry vision and more migraines.  She was investigated by cardiology and had a Holter monitor and also had a 2D echo.  Heart investigation was negative.  However, there is suspicion for POTS.  To see cardiology in 1 month. 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 Now sleeps when she can as she has insomnia but can also sleep for 14 h at a time. She had an ED visit for DKA on 10/11/2021.  Reviewed HbA1c levels: Lab Results  Component Value Date   HGBA1C 8.6 (H) 10/12/2021   HGBA1C 8.0 (A) 07/28/2021   HGBA1C 8.6 (A) 03/17/2021   HGBA1C 8.0 (A) 12/16/2020   HGBA1C 8.1 (A) 09/15/2020   HGBA1C 9.0 (A) 04/01/2020   HGBA1C 9.1 (A) 10/09/2019   HGBA1C 9.0 (A) 06/07/2019   HGBA1C 9.1 (A) 02/01/2019   HGBA1C 9.2 (A) 05/19/2018   HGBA1C 8.7 08/05/2014   HGBA1C 9.9 03/05/2014   HGBA1C  8.0 09/06/2013   HGBA1C 10.8 06/19/2013   HGBA1C 12.5 (H) 06/04/2013   HGBA1C 14.8 (H) 01/17/2013  02/27/2018: HbA1c 9.4% 11/11/2017: HbA1c 8.8% 08/16/2017: HbA1c 8.9% 04/18/2017: HbA1c 9.3% 06/23/2016: HbA1c 12%  Previously on: -Lantus 40 >> 34 units at bedtime >> 17 >> 25 units 2x a day >> Tresiba 40 units daily (not covered) >> Semglee 25 units 2x a day -Humalog >> Lyumjev: ICR  (not actually using an ICR) - 12-14 units -before her dinner and 6-8 units before the other meals Target 100 ISF 25 >> 20 We tried Metformin 1000 mg with dinner-added 06/2019 >> stopped due to diarrhea.  Currently on: - Semglee              30 units at 10 pm             20 units at 10 am - Lyumjev: 18-24 units before dinner 10-12 units before lunch  Continue the same sliding scale: Target 100 ISF 20  She checks her sugars more than 4 times a day with her Dexcom CGM:     Previously:    Previously:   Lowest sugar was 45 at night >> 54 >> 60 >> 70.  No previous hypoglycemia admissions.  She does have a glucagon kit at home. Highest sugar was 485 >> .Marland Kitchen. 300 >> >40.  She has a history of DKA admissions but not since last visit.  She also has a One Touch  Verio glucometer.  Pt's meals are: - Breakfast: skips b/c gastroparesis (nausea) - prev. Reglan, now phenergan - Lunch: sandwich + chips - Dinner: meat + veggies + bread - Snacks: cheese sticks, carrots  No CKD: Lab Results  Component Value Date   BUN 8 10/12/2021   BUN 9 10/12/2021   CREATININE 0.62 10/12/2021   CREATININE 0.74 10/12/2021   + HL: Lab Results  Component Value Date   CHOL 239 (H) 11/10/2020   HDL 66.40 11/10/2020   LDLCALC 155 (H) 11/10/2020   TRIG 89.0 11/10/2020   CHOLHDL 4 11/10/2020  She is not on a statin.    - last eye exam was in 05/2020: No DR reportedly  -+ Occasional numbness and tingling in her feet.  Latest TSH was normal: Lab Results  Component Value Date   TSH 1.086 10/11/2021   Pt has FH of  DM2 in father and PGM.  Cousin with DM1.  She has a history of depression. She smokes half a pack a day.    ROS: + see HPI Neurological: no tremors/+ numbness/+ tingling/no dizziness  I reviewed pt's medications, allergies, PMH, social hx, family hx, and changes were documented in the history of present illness. Otherwise, unchanged from my initial visit note.  Past Medical History:  Diagnosis Date   Anxiety    Depression    Diabetes mellitus type I ([place])    Diabetic gastroparesis ([place])    GERD (gastroesophageal reflux disease)    Past Surgical History:  Procedure Laterality Date   NO PAST SURGERIES     Social History   Socioeconomic History   Marital status: Single    Spouse name: Not on file   Number of children: 0  Occupational History   N/a  Tobacco Use   Smoking status: Current Every Day Smoker    Packs/day: 0.50    Years: 5.00    Pack years: 2.50    Types: Cigarettes   Smokeless tobacco: Never Used   Tobacco comment: NA  Substance and Sexual Activity   Alcohol use: Never    Frequency: Never   Drug use: Never   Sexual activity: Yes    Birth control/protection: None, Condom  Social History Narrative   Lives with mom, step-dad, brother, and sister. Sees bio-dad twice monthly.    Current Outpatient Medications on File Prior to Visit  Medication Sig Dispense Refill   acetone, urine, test strip Check ketones per protocol 50 each 3   BAYER MICROLET LANCETS lancets Check sugars 6 times daily and per protocol for hyper and hypoglycemia 250 each 3   Blood Glucose Monitoring Suppl (ONETOUCH VERIO) w/Device KIT Use to check blood sugar 4 times a day     Continuous Blood Gluc Receiver (DEXCOM G6 RECEIVER) DEVI For continuous blood glucose monitoring 4 times a day; E10.42 1 each 0   Continuous Blood Gluc Sensor (DEXCOM G6 SENSOR) MISC USE FOR CONTINUOUS BLOOD GLUCOSE MONITORING FOUR TIMES A DAY 9 each 3   Continuous Blood Gluc Transmit (DEXCOM G6 TRANSMITTER) MISC USE  FOR CONTINUOUS BLOOD GLUCOSE MONITORING FOUR TIMES A DAY 1 each 3   Glucagon 3 MG/DOSE POWD Place 3 mg into the nose once as needed for up to 1 dose. 1 each 11   glucose blood (ONETOUCH VERIO) test strip Checks blood sugar 4 times daily 400 each 11   ibuprofen (ADVIL) 200 MG tablet Take 200 mg by mouth every 6 (six) hours as needed for headache or mild pain.  Insulin Glargine-yfgn (SEMGLEE, YFGN,) 100 UNIT/ML SOLN Inject 40 Units into the skin daily. 45 mL 1   Insulin Lispro-aabc (LYUMJEV KWIKPEN) 100 UNIT/ML KwikPen INJECT UNDER THE SKIN 4 TO 15 UNITS BEFORE MEALS (THREE TIMES A DAY) 45 mL 3   Insulin Pen Needle (INSUPEN PEN NEEDLES) 32G X 4 MM MISC Inject insulin via insulin pen 5 times daily 500 each 3   No current facility-administered medications on file prior to visit.   Allergies  Allergen Reactions   Levemir [Insulin Detemir]     Hives at injection sites   Augmentin [Amoxicillin-Pot Clavulanate]     Vomiting as young child   Omnicef [Cefdinir]     Vomiting as young child.   Family History  Problem Relation Age of Onset   Hypothyroidism Mother    Cancer Mother        appendix cancer   Diabetes Father        type 2   Hypothyroidism Paternal Grandmother    Diabetes Paternal Grandmother        type 2   Diabetes Cousin        type 1    PE: BP (!) 160/60 (BP Location: Right Arm, Patient Position: Sitting, Cuff Size: Normal)    Pulse (!) 129    Ht _0  (1.702 m)    Wt 178 lb 12.8 oz (81.1 kg)    SpO2 98%    BMI 28.00 kg/m   Wt Readings from Last 3 Encounters:  12/04/21 178 lb 12.8 oz (81.1 kg)  11/04/21 180 lb 6.4 oz (81.8 kg)  10/11/21 160 lb (72.6 kg)   Constitutional: Slightly overweight, in NAD Eyes: PERRLA, EOMI, no exophthalmos ENT: moist mucous membranes, no thyromegaly, no cervical lymphadenopathy Cardiovascular: tachycardia, RR, No MRG Respiratory: CTA B Musculoskeletal: no deformities, strength intact in all 4 Skin: moist, warm, no rashes Neurological:  + tremor with outstretched hands, DTR normal in all 4  ASSESSMENT: 1. DM1, uncontrolled, without long-term complications, but with hyperglycemia  2. Overweight  PLAN:  1. Patient with longstanding, uncontrolled, type 1 diabetes, basal-bolus insulin regimen, with poor control.  At last visit HbA1c was better, at 8.0%, but since then, she had another HbA1c obtained in 10/2021 and this was higher, at 8.6%. At that time, she [facility] to [facility] in D[facility]. -At last visit, reviewing her CGM trends, sugars were better controlled from ~10 AM to 10 PM, but they were increasing after her first meal of the day (which for her is dinner).  They were increasing after her lunch (in the middle of the night) and they were improving after 6-7 AM.  We increased her insulin with dinner and discussed that she also needed a higher dose of insulin before her lunch.  She continues to prefer to use flexible doses rather than insulin to carb ratio.  We did not change her Semglee dose at that time. CGM interpretation: -At today's visit, we reviewed her CGM downloads: It appears that 11% (!) of values are in target range (goal >70%), while 89% are higher than 180 (goal <25%), and 0% are lower than 70 (goal <4%).  The calculated average blood sugar is 266.  The projected HbA1c for the next 3 months (GMI) is 9.7%. -Reviewing the CGM trends, sugars are much higher than before, high at all times of the day with a significant increase after 2 PM and a decrease after 9 PM, after which the sugars increase again and stay elevated throughout the night.  They improve between 7 AM and 2 PM. -At this visit, she describes feeling very poorly, and her blood pressure fluctuates significantly at home.  She feels dizzy and has nausea.  She is not able to eat well.  She feels that she is taking the Lyumjev consistently before meals.  In this case, we discussed that she is probably not taking enough.  I advised her to increase the Lyumjev before  lunch, since this appears to be the meal that can cause the highest postprandial blood sugar.  We will also increase her Semglee dose in the morning to help with midday blood sugars.  I did advise her to do her best to bolus before every meal, even if she is having small meals. -We did discuss about the possibility of starting an insulin pump, however, for now, she does not feel ready to start this due to the complexities of the pump. -I suggested to: Patient Instructions  Please change: - Semglee              35 units in am             20 units at bedtime - Lyumjev: 18-24 units before dinner 18-20 units before lunch  Continue the same sliding scale: Target 100 ISF 20   Please return in 3-4 months.  - advised to check sugars at different times of the day - 4x a day, rotating check times - advised for yearly eye exams >> she is not UTD - return to clinic in 3-4 months  2. Overweight -At last visit, we discussed about the importance of improving diet.  Her diet is hindered by her reverse circadian rhythm and also by the fact that she was only eating 1 meal a day, dinner, around 5-6 PM.  We did discuss about trying to spread her meals throughout the day.  She mentions that she is still not eating well, especially due to nausea in the last 3 months. -At last visit she was planning to restart exercising, but she was not able to do so due to feeling very poorly since last visit. -She lost 2 pounds before last visit and 10 lbs since then: 190 lb >> 178 lbs  Philemon Kingdom, MD PhD [facility]

## 2021-12-22 NOTE — Progress Notes (Signed)
HPI: FU dyspnea and tachycardia. CTA November 2022 showed no pulmonary embolus.  Laboratories November 2022 showed normal BNP, normal troponin, TSH 1.086, free T4 0.99; hgb 18.5.  Echocardiogram December 2022 showed LV function, no significant valvular disease.  Monitor December 2022 showed sinus bradycardia, normal sinus rhythm and sinus tachycardia with no significant arrhythmias.  Since last seen patient mildly improved.  She still has dizziness and weakness with standing.  She states her blood pressure increases with standing.  Occasional mild dyspnea on exertion but no orthopnea, PND, pedal edema, chest pain or syncope.  Current Outpatient Medications  Medication Sig Dispense Refill   acetone, urine, test strip Check ketones per protocol 50 each 3   BAYER MICROLET LANCETS lancets Check sugars 6 times daily and per protocol for hyper and hypoglycemia 250 each 3   Blood Glucose Monitoring Suppl (ONETOUCH VERIO) w/Device KIT Use to check blood sugar 4 times a day     Continuous Blood Gluc Receiver (DEXCOM G6 RECEIVER) DEVI For continuous blood glucose monitoring 4 times a day; E10.42 1 each 0   Continuous Blood Gluc Sensor (DEXCOM G6 SENSOR) MISC USE FOR CONTINUOUS BLOOD GLUCOSE MONITORING FOUR TIMES A DAY 9 each 3   Continuous Blood Gluc Transmit (DEXCOM G6 TRANSMITTER) MISC USE FOR CONTINUOUS BLOOD GLUCOSE MONITORING FOUR TIMES A DAY 1 each 3   Glucagon 3 MG/DOSE POWD Place 3 mg into the nose once as needed for up to 1 dose. 1 each 11   glucose blood (ONETOUCH VERIO) test strip Checks blood sugar 4 times daily 400 each 11   ibuprofen (ADVIL) 200 MG tablet Take 200 mg by mouth every 6 (six) hours as needed for headache or mild pain.     Insulin Glargine-yfgn (SEMGLEE, YFGN,) 100 UNIT/ML SOLN Inject 40 Units into the skin daily. 45 mL 1   Insulin Lispro-aabc (LYUMJEV KWIKPEN) 100 UNIT/ML KwikPen INJECT UNDER THE SKIN 4 TO 15 UNITS BEFORE MEALS (THREE TIMES A DAY) 45 mL 3   Insulin Pen  Needle (INSUPEN PEN NEEDLES) 32G X 4 MM MISC Inject insulin via insulin pen 5 times daily 500 each 3   No current facility-administered medications for this visit.     Past Medical History:  Diagnosis Date   Anxiety    Depression    Diabetes mellitus type I ([place])    Diabetic gastroparesis ([place])    GERD (gastroesophageal reflux disease)     Past Surgical History:  Procedure Laterality Date   NO PAST SURGERIES      Social History   Socioeconomic History   Marital status: Single    Spouse name: Not on file   Number of children: 0   Years of education: Not on file   Highest education level: Not on file  Occupational History   Not on file  Tobacco Use   Smoking status: Former    Packs/day: 0.50    Years: 5.00    Pack years: 2.50    Types: Cigarettes   Smokeless tobacco: Never   Tobacco comments:    NA  Vaping Use   Vaping Use: Never used  Substance and Sexual Activity   Alcohol use: Never   Drug use: Never   Sexual activity: Yes    Birth control/protection: None, Condom  Other Topics Concern   Not on file  Social History Narrative   Lives with mom, step-dad, brother, and sister. Sees bio-dad twice monthly.    Social Determinants of Health   Financial  Resource Strain: Not on file  Food Insecurity: Not on file  Transportation Needs: Not on file  Physical Activity: Not on file  Stress: Not on file  Social Connections: Not on file  Intimate Partner Violence: Not on file    Family History  Problem Relation Age of Onset   Hypothyroidism Mother    Cancer Mother        appendix cancer   Diabetes Father        type 2   Hypothyroidism Paternal Grandmother    Diabetes Paternal Grandmother        type 2   Diabetes Cousin        type 1    ROS: no fevers or chills, productive cough, hemoptysis, dysphasia, odynophagia, melena, hematochezia, dysuria, hematuria, rash, seizure activity, orthopnea, PND, pedal edema, claudication. Remaining systems are  negative.  Physical Exam: Well-developed well-nourished in no acute distress.  Skin is warm and dry.  HEENT is normal.  Neck is supple.  Chest is clear to auscultation with normal expansion.  Cardiovascular exam is regular rate and rhythm.  Abdominal exam nontender or distended. No masses palpated. Extremities show no edema. neuro grossly intact   A/P  1 dyspnea-etiology unclear.  Work-up to date has been unrevealing including CT showing no pulmonary embolus.  Echocardiogram showed normal LV function.  2 dizziness/tachycardia-etiology unclear.  Echocardiogram showed preserved LV function.  Monitor showed no significant arrhythmias.  They are concerned about the possibility of POTS though this does not appear to be likely.  Question contribution from autonomic dysfunction from diabetes mellitus.  They requested a neurology evaluation and we will arrange.  I will add Toprol 25 mg daily for her palpitations.  We will see her back in 3 months to make sure she is improving.  3 diabetes mellitus-Per primary care.  Kirk Ruths, MD

## 2021-12-30 ENCOUNTER — Encounter: Payer: Self-pay | Admitting: Cardiology

## 2021-12-30 ENCOUNTER — Ambulatory Visit (INDEPENDENT_AMBULATORY_CARE_PROVIDER_SITE_OTHER): Payer: BC Managed Care – PPO | Admitting: Cardiology

## 2021-12-30 ENCOUNTER — Other Ambulatory Visit: Payer: Self-pay

## 2021-12-30 VITALS — BP 145/100 | HR 114 | Ht 67.0 in | Wt 187.1 lb

## 2021-12-30 DIAGNOSIS — R000 Tachycardia, unspecified: Secondary | ICD-10-CM | POA: Diagnosis not present

## 2021-12-30 DIAGNOSIS — R42 Dizziness and giddiness: Secondary | ICD-10-CM | POA: Diagnosis not present

## 2021-12-30 DIAGNOSIS — R002 Palpitations: Secondary | ICD-10-CM

## 2021-12-30 MED ORDER — METOPROLOL SUCCINATE ER 25 MG PO TB24: 25.0000 mg | ORAL_TABLET | Freq: Every day | ORAL | 3 refills | Status: DC

## 2021-12-30 NOTE — Patient Instructions (Signed)
Medication Instructions:   START METOPROLOL SUCC ER 25 MG ONCE DAILY AT BEDTIME  *If you need a refill on your cardiac medications before your next appointment, please call your pharmacy*   Follow-Up: At [facility], you and your health needs are our priority.  As part of our continuing mission to provide you with exceptional heart care, we have created designated Provider Care Teams.  These Care Teams include your primary Cardiologist (physician) and Advanced Practice Providers (APPs -  Physician Assistants and Nurse Practitioners) who all work together to provide you with the care you need, when you need it.  We recommend signing up for the patient portal called "MyChart".  Sign up information is provided on this After Visit Summary.  MyChart is used to connect with patients for Virtual Visits (Telemedicine).  Patients are able to view lab/test results, encounter notes, upcoming appointments, etc.  Non-urgent messages can be sent to your provider as well.   To learn more about what you can do with MyChart, go to [email or web address].    Your next appointment:   3 month(s)  The format for your next appointment:   In Person  Provider:   Olga Millers MD

## 2022-02-24 ENCOUNTER — Encounter: Payer: Self-pay | Admitting: Diagnostic Neuroimaging

## 2022-02-24 ENCOUNTER — Ambulatory Visit (INDEPENDENT_AMBULATORY_CARE_PROVIDER_SITE_OTHER): Payer: BC Managed Care – PPO | Admitting: Diagnostic Neuroimaging

## 2022-02-24 VITALS — BP 154/101 | HR 101 | Ht 67.0 in | Wt 204.0 lb

## 2022-02-24 DIAGNOSIS — G4452 New daily persistent headache (NDPH): Secondary | ICD-10-CM

## 2022-02-24 DIAGNOSIS — R42 Dizziness and giddiness: Secondary | ICD-10-CM | POA: Diagnosis not present

## 2022-02-24 DIAGNOSIS — I10 Essential (primary) hypertension: Secondary | ICD-10-CM | POA: Diagnosis not present

## 2022-02-24 NOTE — Progress Notes (Signed)
? ?[facility] ? ?PATIENT: Veronica Liu ?DOB: [birth year] ? ?REFERRING CLINICIAN: Lelon Perla, MD ?HISTORY FROM: patient  ?REASON FOR VISIT: new consult  ? ? ?HISTORICAL ? ?CHIEF COMPLAINT:  ?Chief Complaint  ?Patient presents with  ? Dizziness  ?  Rm 7 New Pt  "suspected to have POTS syndrome"   ? ? ?HISTORY OF PRESENT ILLNESS:  ? ?26 year old female here for evaluation of dizziness and headaches.  History of type 1 diabetes. ? ?Since September 2020 patient has had onset of lightheadedness, dizziness, heart rate and blood pressure fluctuation and daily headaches.  Patient notes when she stands up her blood pressure goes up to 150/100, heart rate up to 120s.  Interestingly when she lays down her heart rate actually dropped to 80/60 and heart rate reduces to 100. In general her symptoms are worse when she stands up. ? ?Also having daily headaches, more on the left side with pressure sensation, nausea and pain.  Prior history of migraine headaches. ? ?No other recent accidents injuries, traumas or other provoking factors. ? ?Patient does have prior history of anxiety and panic attacks.  Current symptoms feel different. ? ? ? ?REVIEW OF SYSTEMS: Full 14 system review of systems performed and negative with exception of: as per HPI. ? ?ALLERGIES: ?Allergies  ?Allergen Reactions  ? Levemir [Insulin Detemir]   ?  Hives at injection sites  ? Augmentin [Amoxicillin-Pot Clavulanate]   ?  Vomiting as young child  ? [place]   ?  Vomiting as young child.  ? ? ?HOME MEDICATIONS: ?Outpatient Medications Prior to Visit  ?Medication Sig Dispense Refill  ? acetone, urine, test strip Check ketones per protocol 50 each 3  ? BAYER MICROLET LANCETS lancets Check sugars 6 times daily and per protocol for hyper and hypoglycemia 250 each 3  ? Blood Glucose Monitoring Suppl (ONETOUCH VERIO) w/Device KIT Use to check blood sugar 4 times a day    ? Continuous Blood Gluc Receiver (DEXCOM G6 RECEIVER) DEVI  For continuous blood glucose monitoring 4 times a day; E10.42 1 each 0  ? Continuous Blood Gluc Sensor (DEXCOM G6 SENSOR) MISC USE FOR CONTINUOUS BLOOD GLUCOSE MONITORING FOUR TIMES A DAY 9 each 3  ? Continuous Blood Gluc Transmit (DEXCOM G6 TRANSMITTER) MISC USE FOR CONTINUOUS BLOOD GLUCOSE MONITORING FOUR TIMES A DAY 1 each 3  ? Glucagon 3 MG/DOSE POWD Place 3 mg into the nose once as needed for up to 1 dose. 1 each 11  ? glucose blood (ONETOUCH VERIO) test strip Checks blood sugar 4 times daily 400 each 11  ? ibuprofen (ADVIL) 200 MG tablet Take 200 mg by mouth every 6 (six) hours as needed for headache or mild pain.    ? Insulin Glargine-yfgn (SEMGLEE, YFGN,) 100 UNIT/ML SOLN Inject 40 Units into the skin daily. 45 mL 1  ? Insulin Lispro-aabc (LYUMJEV KWIKPEN) 100 UNIT/ML KwikPen INJECT UNDER THE SKIN 4 TO 15 UNITS BEFORE MEALS (THREE TIMES A DAY) 45 mL 3  ? Insulin Pen Needle (INSUPEN PEN NEEDLES) 32G X 4 MM MISC Inject insulin via insulin pen 5 times daily 500 each 3  ? metoprolol succinate (TOPROL XL) 25 MG 24 hr tablet Take 1 tablet (25 mg total) by mouth daily. (Patient not taking: Reported on 02/24/2022) 90 tablet 3  ? ?No facility-administered medications prior to visit.  ? ? ?PAST MEDICAL HISTORY: ?Past Medical History:  ?Diagnosis Date  ? Anxiety   ? Depression   ? Diabetes mellitus type  I ([place])   ? onset age 27  ? Diabetic gastroparesis ([place])   ? GERD (gastroesophageal reflux disease)   ? ? ?PAST SURGICAL HISTORY: ?Past Surgical History:  ?Procedure Laterality Date  ? NO PAST SURGERIES    ? ? ?FAMILY HISTORY: ?Family History  ?Problem Relation Age of Onset  ? Hypothyroidism Mother   ? Cancer Mother   ?     appendix cancer  ? Diabetes Father   ?     type 2  ? Hypothyroidism Paternal Grandmother   ? Diabetes Paternal Grandmother   ?     type 2  ? Diabetes Cousin   ?     type 1  ? ? ?SOCIAL HISTORY: ?Social History  ? ?Socioeconomic History  ? Marital status: Single  ?  Spouse name: Not on file  ? Number  of children: 0  ? Years of education: Not on file  ? Highest education level: GED or equivalent  ?Occupational History  ? Not on file  ?Tobacco Use  ? Smoking status: Former  ?  Packs/day: 0.50  ?  Years: 5.00  ?  Pack years: 2.50  ?  Types: Cigarettes  ? Smokeless tobacco: Never  ? Tobacco comments:  ?  NA  ?Vaping Use  ? Vaping Use: Never used  ?Substance and Sexual Activity  ? Alcohol use: Never  ? Drug use: Never  ? Sexual activity: Yes  ?  Birth control/protection: None, Condom  ?Other Topics Concern  ? Not on file  ?Social History Narrative  ? Lives with mom, step-dad, brother, and sister. Sees bio-dad twice monthly.   ? No caffeine  ? ?Social Determinants of Health  ? ?Financial Resource Strain: Not on file  ?Food Insecurity: Not on file  ?Transportation Needs: Not on file  ?Physical Activity: Not on file  ?Stress: Not on file  ?Social Connections: Not on file  ?Intimate Partner Violence: Not on file  ? ? ? ?PHYSICAL EXAM ? ?GENERAL EXAM/CONSTITUTIONAL: ? ?No data found. ? ?Vitals:  ?Vitals:  ? 02/24/22 1124  ?BP: (!) 154/101  ?Pulse: (!) 101  ?Weight: 204 lb (92.5 kg)  ?Height: _0  (1.702 m)  ? ?Body mass index is 31.95 kg/m?. ?Wt Readings from Last 3 Encounters:  ?02/24/22 204 lb (92.5 kg)  ?12/30/21 187 lb 1.9 oz (84.9 kg)  ?12/04/21 178 lb 12.8 oz (81.1 kg)  ? ?Patient is in no distress; well developed, nourished and groomed; neck is supple ? ?CARDIOVASCULAR: ?Examination of carotid arteries is normal; no carotid bruits ?Regular rate and rhythm, no murmurs ?Examination of peripheral vascular system by observation and palpation is normal ? ?EYES: ?Ophthalmoscopic exam of optic discs and posterior segments is normal; no papilledema or hemorrhages ?No results found. ? ?MUSCULOSKELETAL: ?Gait, strength, tone, movements noted in Neurologic exam below ? ?NEUROLOGIC: ?MENTAL STATUS:  ?   ? View : No data to display.  ?  ?  ?  ? ?awake, alert, oriented to person, place and time ?recent and remote memory  intact ?normal attention and concentration ?language fluent, comprehension intact, naming intact ?fund of knowledge appropriate ? ?CRANIAL NERVE:  ?2nd - no papilledema on fundoscopic exam ?2nd, 3rd, 4th, 6th - pupils equal and reactive to light, visual fields full to confrontation, extraocular muscles intact, no nystagmus ?5th - facial sensation symmetric ?7th - facial strength symmetric ?8th - hearing intact ?9th - palate elevates symmetrically, uvula midline ?11th - shoulder shrug symmetric ?12th - tongue protrusion midline ? ?MOTOR:  ?normal  bulk and tone, full strength in the BUE, BLE ? ?SENSORY:  ?normal and symmetric to light touch, temperature, vibration ? ?COORDINATION:  ?finger-nose-finger, fine finger movements normal ? ?REFLEXES:  ?deep tendon reflexes TRACE and symmetric ? ?GAIT/STATION:  ?narrow based gait ? ? ? ? ?DIAGNOSTIC DATA (LABS, IMAGING, TESTING) ?- I reviewed patient records, labs, notes, testing and imaging myself where available. ? ?Lab Results  ?Component Value Date  ? WBC 11.1 (H) 10/11/2021  ? HGB 14.3 10/12/2021  ? HCT 42.0 10/12/2021  ? MCV 82.8 10/11/2021  ? PLT 320 10/11/2021  ? ?   ?Component Value Date/Time  ? NA 134 (L) 10/12/2021 0353  ? K 3.4 (L) 10/12/2021 0353  ? CL 107 10/12/2021 0353  ? CO2 16 (L) 10/12/2021 0353  ? GLUCOSE 150 (H) 10/12/2021 0353  ? BUN 8 10/12/2021 0353  ? CREATININE 0.62 10/12/2021 0353  ? CREATININE 0.83 09/17/2021 0000  ? CALCIUM 8.7 (L) 10/12/2021 0353  ? PROT 7.5 10/11/2021 2112  ? ALBUMIN 4.2 10/11/2021 2112  ? AST 11 (L) 10/11/2021 2112  ? ALT 12 10/11/2021 2112  ? ALKPHOS 59 10/11/2021 2112  ? BILITOT 2.1 (H) 10/11/2021 2112  ? GFRNONAA >60 10/12/2021 0353  ? GFRNONAA 95 11/10/2020 1422  ? GFRAA 110 11/10/2020 1422  ? ?Lab Results  ?Component Value Date  ? CHOL 239 (H) 11/10/2020  ? HDL 66.40 11/10/2020  ? LDLCALC 155 (H) 11/10/2020  ? TRIG 89.0 11/10/2020  ? CHOLHDL 4 11/10/2020  ? ?Lab Results  ?Component Value Date  ? HGBA1C 8.6 (H) 10/12/2021   ? ?No results found for: VITAMINB12 ?Lab Results  ?Component Value Date  ? TSH 1.086 10/11/2021  ? ? ? ? ? ?ASSESSMENT AND PLAN ? ?26 y.o. year old female here with dizziness with standing up, with increase in

## 2022-03-10 ENCOUNTER — Telehealth: Payer: Self-pay | Admitting: Diagnostic Neuroimaging

## 2022-03-10 NOTE — Progress Notes (Signed)
? ? ? ? ?HPI: FU orthostasis, dyspnea and tachycardia. CTA November 2022 showed no pulmonary embolus.  Laboratories November 2022 showed normal BNP, normal troponin, TSH 1.086, free T4 0.99; hgb 18.5.  Echocardiogram December 2022 showed LV function, no significant valvular disease.  Monitor December 2022 showed sinus bradycardia, normal sinus rhythm and sinus tachycardia with no significant arrhythmias.  Patient was seen by neurology March 2023 and MRI of brain was scheduled.  Since last seen patient continues to describe dizziness with lying down improved with standing up.  She has some dyspnea on exertion.  No orthopnea, PND or pedal edema.  She has generalized fatigue.  She questions whether she has had hyper POTS.  She did not tolerate metoprolol stating that her blood pressure was too low when she laid down. ? ?Current Outpatient Medications  ?Medication Sig Dispense Refill  ? acetone, urine, test strip Check ketones per protocol 50 each 3  ? BAYER MICROLET LANCETS lancets Check sugars 6 times daily and per protocol for hyper and hypoglycemia 250 each 3  ? Blood Glucose Monitoring Suppl (ONETOUCH VERIO) w/Device KIT Use to check blood sugar 4 times a day    ? Continuous Blood Gluc Receiver (DEXCOM G6 RECEIVER) DEVI For continuous blood glucose monitoring 4 times a day; E10.42 1 each 0  ? Continuous Blood Gluc Sensor (DEXCOM G6 SENSOR) MISC USE FOR CONTINUOUS BLOOD GLUCOSE MONITORING FOUR TIMES A DAY 9 each 3  ? Continuous Blood Gluc Transmit (DEXCOM G6 TRANSMITTER) MISC USE FOR CONTINUOUS BLOOD GLUCOSE MONITORING FOUR TIMES A DAY 1 each 3  ? Glucagon 3 MG/DOSE POWD Place 3 mg into the nose once as needed for up to 1 dose. 1 each 11  ? glucose blood (ONETOUCH VERIO) test strip Checks blood sugar 4 times daily 400 each 11  ? ibuprofen (ADVIL) 200 MG tablet Take 200 mg by mouth every 6 (six) hours as needed for headache or mild pain.    ? Insulin Lispro-aabc (LYUMJEV KWIKPEN) 100 UNIT/ML KwikPen INJECT UNDER THE  SKIN 4 TO 15 UNITS BEFORE MEALS (THREE TIMES A DAY) 45 mL 3  ? Insulin Pen Needle (INSUPEN PEN NEEDLES) 32G X 4 MM MISC Inject insulin via insulin pen 5 times daily 500 each 3  ? SEMGLEE, YFGN, 100 UNIT/ML Pen INJECT 40 UNITS UNDER THE SKIN DAILY 45 mL 1  ? ?No current facility-administered medications for this visit.  ? ? ? ?Past Medical History:  ?Diagnosis Date  ? Anxiety   ? Depression   ? Diabetes mellitus type I ([place])   ? onset age 65  ? Diabetic gastroparesis ([place])   ? GERD (gastroesophageal reflux disease)   ? ? ?Past Surgical History:  ?Procedure Laterality Date  ? NO PAST SURGERIES    ? ? ?Social History  ? ?Socioeconomic History  ? Marital status: Single  ?  Spouse name: Not on file  ? Number of children: 0  ? Years of education: Not on file  ? Highest education level: GED or equivalent  ?Occupational History  ? Not on file  ?Tobacco Use  ? Smoking status: Former  ?  Packs/day: 0.50  ?  Years: 5.00  ?  Pack years: 2.50  ?  Types: Cigarettes  ? Smokeless tobacco: Never  ? Tobacco comments:  ?  NA  ?Vaping Use  ? Vaping Use: Never used  ?Substance and Sexual Activity  ? Alcohol use: Never  ? Drug use: Never  ? Sexual activity: Yes  ?  Birth  control/protection: None, Condom  ?Other Topics Concern  ? Not on file  ?Social History Narrative  ? Lives with mom, step-dad, brother, and sister. Sees bio-dad twice monthly.   ? No caffeine  ? ?Social Determinants of Health  ? ?Financial Resource Strain: Not on file  ?Food Insecurity: Not on file  ?Transportation Needs: Not on file  ?Physical Activity: Not on file  ?Stress: Not on file  ?Social Connections: Not on file  ?Intimate Partner Violence: Not on file  ? ? ?Family History  ?Problem Relation Age of Onset  ? Hypothyroidism Mother   ? Cancer Mother   ?     appendix cancer  ? Diabetes Father   ?     type 2  ? Hypothyroidism Paternal Grandmother   ? Diabetes Paternal Grandmother   ?     type 2  ? Diabetes Cousin   ?     type 1  ? ? ?ROS: no fevers or chills,  productive cough, hemoptysis, dysphasia, odynophagia, melena, hematochezia, dysuria, hematuria, rash, seizure activity, orthopnea, PND, pedal edema, claudication. Remaining systems are negative. ? ?Physical Exam: ?Well-developed well-nourished in no acute distress.  ?Skin is warm and dry.  ?HEENT is normal.  ?Neck is supple.  ?Chest is clear to auscultation with normal expansion.  ?Cardiovascular exam is regular rate and rhythm.  ?Abdominal exam nontender or distended. No masses palpated. ?Extremities show no edema. ?neuro grossly intact ? ?ECG- personally reviewed ? ?A/P ? ?1 dyspnea-work-up has been unrevealing including CT showing no pulmonary embolus and echocardiogram revealing normal LV function. ? ?2 dizziness/tachycardia-etiology unclear.  She is seeing neurology and MRI scheduled.  She questions hyper POTS.  They also would like to see Dr. Caryl Comes to see if he has any suggestions.  We will arrange. ? ?Kirk Ruths, MD ? ? ? ?

## 2022-03-10 NOTE — Telephone Encounter (Signed)
LVm for pt to call back to schedule  ?Yetta Numbers: [id number] (exp. 03/03/22 to 04/01/22) ?

## 2022-03-16 ENCOUNTER — Other Ambulatory Visit: Payer: Self-pay | Admitting: Internal Medicine

## 2022-03-18 NOTE — Telephone Encounter (Signed)
spoke to the patient she is going to hold off right now due to the cost  ?

## 2022-03-19 ENCOUNTER — Encounter: Payer: Self-pay | Admitting: Internal Medicine

## 2022-03-19 ENCOUNTER — Ambulatory Visit (INDEPENDENT_AMBULATORY_CARE_PROVIDER_SITE_OTHER): Payer: BC Managed Care – PPO | Admitting: Internal Medicine

## 2022-03-19 VITALS — BP 140/90 | HR 129 | Ht 67.0 in | Wt 200.4 lb

## 2022-03-19 DIAGNOSIS — E663 Overweight: Secondary | ICD-10-CM

## 2022-03-19 DIAGNOSIS — E1065 Type 1 diabetes mellitus with hyperglycemia: Secondary | ICD-10-CM | POA: Diagnosis not present

## 2022-03-19 LAB — POCT GLYCOSYLATED HEMOGLOBIN (HGB A1C): Hemoglobin A1C: 9.2 % — AB (ref 4.0–5.6)

## 2022-03-19 NOTE — Patient Instructions (Addendum)
Please continue: ?- Semglee  - try to take along Lyumjev, wtth a meal ?            35 units in am ?            20 units in the evening  ?- Lyumjev: ?18-24 units before dinner ?18-20 units before lunch ?- Lyumjev sliding scale: ?Target 100 ?ISF 20 (may need to decrease to 15) ?  ?Please return in 3-4 months. ?

## 2022-03-19 NOTE — Progress Notes (Signed)
Patient ID: Veronica Liu, female   DOB: Nov 24, 1996, 26 y.o.   MRN: [id number] ? ?This visit occurred during the SARS-CoV-2 public health emergency.  Safety protocols were in place, including screening questions prior to the visit, additional usage of staff PPE, and extensive cleaning of exam room while observing appropriate contact time as indicated for disinfecting solutions.  ? ?HPI: ?Veronica Liu is a 26 y.o.-year-old female, returning for follow-up for DM1, dx'ed at 26 y/o as DM2, and DM1 at 26 y/o, on insulin since 26 y/o uncontrolled, with long-term complications (PN, gastoparesis).  She was previously followed by Dr. Tobe Sos and then Annia Belt with pediatric endocrinology at [facility].  Last visit with 3.5 months ago. ? ?Interim history: ?Last fall she had a panic attack, after which she started to feel very dizzy and feel very poorly.  Her blood pressure started to fluctuate significantly and she also started to have nausea, abdominal pain, anxiety, palpitations, shortness of breath, chest pain.  The symptoms continue.  She also has blurry vision and daily migraines.  Also, nausea, constipation, general muscle aches.  She was investigated by cardiology and had a Holter monitor and also had a 2D echo.  Heart investigation was negative.  She is suspected for POTS.  She will see Dr. Caryl Comes soon.  She may be referred to the dysautonomia clinic at [facility].  She tried metoprolol but blood pressure got too low while lying down.  ?She saw neurology >> will have an MRI soon. ?She used  to have a reverse circadian rhythm, staying up during the night and waking up around 3-4 PM.  She tells me that she forgets to take family many times during the week.  She has OCD and if she cannot let exactly 12 hours between the doses, she skips it. ? ?Reviewed HbA1c levels: ?Lab Results  ?Component Value Date  ? HGBA1C 8.6 (H) 10/12/2021  ? HGBA1C 8.0 (A) 07/28/2021  ? HGBA1C 8.6 (A) 03/17/2021  ? HGBA1C 8.0 (A) 12/16/2020  ?  HGBA1C 8.1 (A) 09/15/2020  ? HGBA1C 9.0 (A) 04/01/2020  ? HGBA1C 9.1 (A) 10/09/2019  ? HGBA1C 9.0 (A) 06/07/2019  ? HGBA1C 9.1 (A) 02/01/2019  ? HGBA1C 9.2 (A) 05/19/2018  ? HGBA1C 8.7 08/05/2014  ? HGBA1C 9.9 03/05/2014  ? HGBA1C 8.0 09/06/2013  ? HGBA1C 10.8 06/19/2013  ? HGBA1C 12.5 (H) 06/04/2013  ? HGBA1C 14.8 (H) 01/17/2013  ?02/27/2018: HbA1c 9.4% ?11/11/2017: HbA1c 8.8% ?08/16/2017: HbA1c 8.9% ?04/18/2017: HbA1c 9.3% ?06/23/2016: HbA1c 12% ? ?Previously on: ?-Lantus 40 >> 34 units at bedtime >> 17 >> 25 units 2x a day >> Tresiba 40 units daily (not covered) >> Semglee 25 units 2x a day ?-Humalog >> Lyumjev: ?ICR  (not actually using an ICR) - 12-14 units -before her dinner and 6-8 units before the other meals ?Target 100 ?ISF 25 >> 20 ?We tried Metformin 1000 mg with dinner-added 06/2019 >> stopped due to diarrhea. ? ?Currently on: ?- Semglee  ?            30 >> 35  units at 10 pm ?            20 units at 10 am ?- Lyumjev: ?18-24 units before dinner ?10-12 >> 18-20 units before lunch ?+ sliding scale: ?Target 100 ?ISF 20 ? ?She checks her sugars more than 4 times a day with her Dexcom CGM: ? ? ?Previously: ? ? ? ? ?Previously:  ? ? ?Lowest sugar was 45 at night >> 54 >>  60 >> 70 >> 50s x1 in the middle of the night).  No previous hypoglycemia admissions.  She does have a glucagon kit at home. ?Highest sugar was 485 >> .Marland Kitchen. 300 >> >400 >> HI.  She had an ED visit for DKA on 10/11/2021. ? ?She also has a [employer]. ? ?Pt's meals are: ?- Breakfast: skips b/c gastroparesis (nausea) - prev. Reglan, now phenergan ?- Lunch: sandwich + chips ?- Dinner: meat + veggies + bread ?- Snacks: cheese sticks, carrots ? ?No CKD: ?Lab Results  ?Component Value Date  ? BUN 8 10/12/2021  ? BUN 9 10/12/2021  ? CREATININE 0.62 10/12/2021  ? CREATININE 0.74 10/12/2021  ? ?+ HL: ?Lab Results  ?Component Value Date  ? CHOL 239 (H) 11/10/2020  ? HDL 66.40 11/10/2020  ? LDLCALC 155 (H) 11/10/2020  ? TRIG 89.0 11/10/2020  ?  CHOLHDL 4 11/10/2020  ?She is not on a statin.   ? ?- last eye exam was in 05/2020: No DR reportedly ? ?-+ Occasional numbness and tingling in her feet. ? ?Latest TSH was normal: ?Lab Results  ?Component Value Date  ? TSH 1.086 10/11/2021  ? ?Pt has FH of DM2 in father and PGM.  Cousin with DM1. ? ?She has a history of depression. ?She was smoking half a pack a day >> quit in 10/2021..   ? ?ROS: ?+ see HPI ?Neurological: no tremors/+ numbness/+ tingling/no dizziness ? ?I reviewed pt's medications, allergies, PMH, social hx, family hx, and changes were documented in the history of present illness. Otherwise, unchanged from my initial visit note. ? ?Past Medical History:  ?Diagnosis Date  ? Anxiety   ? Depression   ? Diabetes mellitus type I ([place])   ? onset age 39  ? Diabetic gastroparesis ([place])   ? GERD (gastroesophageal reflux disease)   ? ?Past Surgical History:  ?Procedure Laterality Date  ? NO PAST SURGERIES    ? ?Social History  ? ?Socioeconomic History  ? Marital status: Single  ?  Spouse name: Not on file  ? Number of children: 0  ?Occupational History  ? N/a  ?Tobacco Use  ? Smoking status: Current Every Day Smoker  ?  Packs/day: 0.50  ?  Years: 5.00  ?  Pack years: 2.50  ?  Types: Cigarettes  ? Smokeless tobacco: Never Used  ? Tobacco comment: NA  ?Substance and Sexual Activity  ? Alcohol use: Never  ?  Frequency: Never  ? Drug use: Never  ? Sexual activity: Yes  ?  Birth control/protection: None, Condom  ?Social History Narrative  ? Lives with mom, step-dad, brother, and sister. Sees bio-dad twice monthly.   ? ?Current Outpatient Medications on File Prior to Visit  ?Medication Sig Dispense Refill  ? acetone, urine, test strip Check ketones per protocol 50 each 3  ? BAYER MICROLET LANCETS lancets Check sugars 6 times daily and per protocol for hyper and hypoglycemia 250 each 3  ? Blood Glucose Monitoring Suppl (ONETOUCH VERIO) w/Device KIT Use to check blood sugar 4 times a day    ? Continuous Blood Gluc  Receiver (DEXCOM G6 RECEIVER) DEVI For continuous blood glucose monitoring 4 times a day; E10.42 1 each 0  ? Continuous Blood Gluc Sensor (DEXCOM G6 SENSOR) MISC USE FOR CONTINUOUS BLOOD GLUCOSE MONITORING FOUR TIMES A DAY 9 each 3  ? Continuous Blood Gluc Transmit (DEXCOM G6 TRANSMITTER) MISC USE FOR CONTINUOUS BLOOD GLUCOSE MONITORING FOUR TIMES A DAY 1 each 3  ?  Glucagon 3 MG/DOSE POWD Place 3 mg into the nose once as needed for up to 1 dose. 1 each 11  ? glucose blood (ONETOUCH VERIO) test strip Checks blood sugar 4 times daily 400 each 11  ? ibuprofen (ADVIL) 200 MG tablet Take 200 mg by mouth every 6 (six) hours as needed for headache or mild pain.    ? Insulin Lispro-aabc (LYUMJEV KWIKPEN) 100 UNIT/ML KwikPen INJECT UNDER THE SKIN 4 TO 15 UNITS BEFORE MEALS (THREE TIMES A DAY) 45 mL 3  ? Insulin Pen Needle (INSUPEN PEN NEEDLES) 32G X 4 MM MISC Inject insulin via insulin pen 5 times daily 500 each 3  ? metoprolol succinate (TOPROL XL) 25 MG 24 hr tablet Take 1 tablet (25 mg total) by mouth daily. (Patient not taking: Reported on 02/24/2022) 90 tablet 3  ? SEMGLEE, YFGN, 100 UNIT/ML Pen INJECT 40 UNITS UNDER THE SKIN DAILY 45 mL 1  ? ?No current facility-administered medications on file prior to visit.  ? ?Allergies  ?Allergen Reactions  ? Levemir [Insulin Detemir]   ?  Hives at injection sites  ? Augmentin [Amoxicillin-Pot Clavulanate]   ?  Vomiting as young child  ? [place]   ?  Vomiting as young child.  ? ?Family History  ?Problem Relation Age of Onset  ? Hypothyroidism Mother   ? Cancer Mother   ?     appendix cancer  ? Diabetes Father   ?     type 2  ? Hypothyroidism Paternal Grandmother   ? Diabetes Paternal Grandmother   ?     type 2  ? Diabetes Cousin   ?     type 1  ? ? ?PE: ?BP 140/90 (BP Location: Left Arm, Patient Position: Sitting, Cuff Size: Normal)   Pulse (!) 129   Ht _0  (1.702 m)   Wt 200 lb 6.4 oz (90.9 kg)   SpO2 99%   BMI 31.39 kg/m?  ? ?Wt Readings from Last 3 Encounters:   ?03/19/22 200 lb 6.4 oz (90.9 kg)  ?02/24/22 204 lb (92.5 kg)  ?12/30/21 187 lb 1.9 oz (84.9 kg)  ? ?Constitutional: Slightly overweight, in NAD ?Eyes: PERRLA, EOMI, no exophthalmos ?ENT: moist mucous membrane

## 2022-03-24 ENCOUNTER — Encounter: Payer: Self-pay | Admitting: Cardiology

## 2022-03-24 ENCOUNTER — Ambulatory Visit (INDEPENDENT_AMBULATORY_CARE_PROVIDER_SITE_OTHER): Payer: BC Managed Care – PPO | Admitting: Cardiology

## 2022-03-24 VITALS — BP 120/90 | HR 88 | Ht 67.0 in | Wt 205.0 lb

## 2022-03-24 DIAGNOSIS — R42 Dizziness and giddiness: Secondary | ICD-10-CM | POA: Diagnosis not present

## 2022-03-24 DIAGNOSIS — G90A Postural orthostatic tachycardia syndrome (POTS): Secondary | ICD-10-CM | POA: Diagnosis not present

## 2022-03-24 DIAGNOSIS — R002 Palpitations: Secondary | ICD-10-CM | POA: Diagnosis not present

## 2022-03-24 DIAGNOSIS — R000 Tachycardia, unspecified: Secondary | ICD-10-CM | POA: Diagnosis not present

## 2022-03-24 NOTE — Patient Instructions (Signed)
  Follow-Up: At [facility], you and your health needs are our priority.  As part of our continuing mission to provide you with exceptional heart care, we have created designated Provider Care Teams.  These Care Teams include your primary Cardiologist (physician) and Advanced Practice Providers (APPs -  Physician Assistants and Nurse Practitioners) who all work together to provide you with the care you need, when you need it.  We recommend signing up for the patient portal called "MyChart".  Sign up information is provided on this After Visit Summary.  MyChart is used to connect with patients for Virtual Visits (Telemedicine).  Patients are able to view lab/test results, encounter notes, upcoming appointments, etc.  Non-urgent messages can be sent to your provider as well.   To learn more about what you can do with MyChart, go to [email or web address].    Your next appointment:    As needed  Important Information About Sugar       

## 2022-03-27 IMAGING — CT CT ANGIO CHEST
2 of 7 series · 19 of 46 positions shown · IV contrast (APPLIED)
Comparison: None.

CLINICAL DATA: Shortness of breath and dyspnea

EXAM:
CT ANGIOGRAPHY CHEST WITH CONTRAST
TECHNIQUE: Multidetector CT imaging of the chest was performed using the
standard protocol during bolus administration of intravenous
contrast. Multiplanar CT image reconstructions and MIPs were
obtained to evaluate the vascular anatomy.
CONTRAST:  65mL OMNIPAQUE IOHEXOL 350 MG/ML SOLN

[Series 9: thins · axial · 0.56mm/px · z∈[+1087,+1316]mm · 16 of 369 slices shown]
[im 21/369  lung]
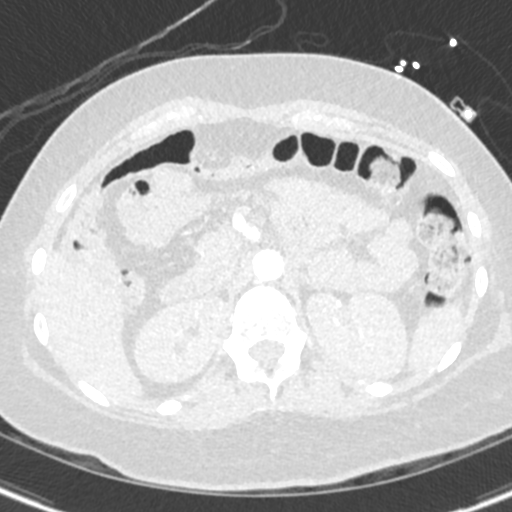
[im 41/369  soft-tissue]
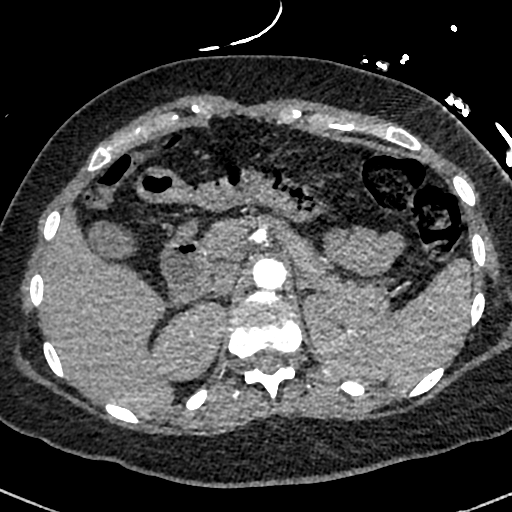
[im 62/369  lung]
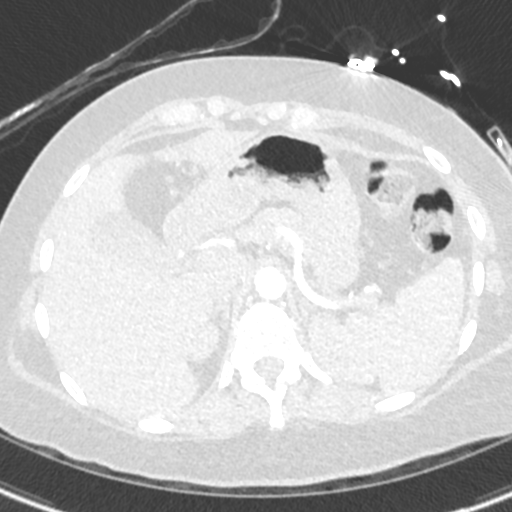
[im 82/369  soft-tissue]
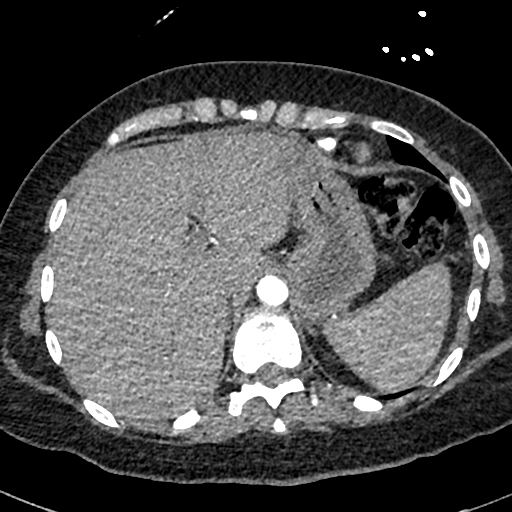
[im 103/369  lung]
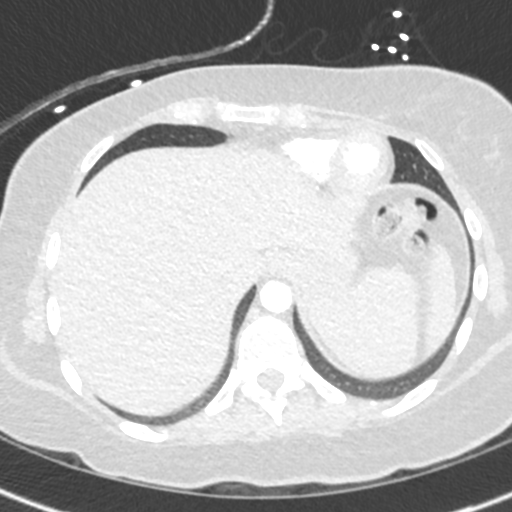
[im 123/369  soft-tissue]
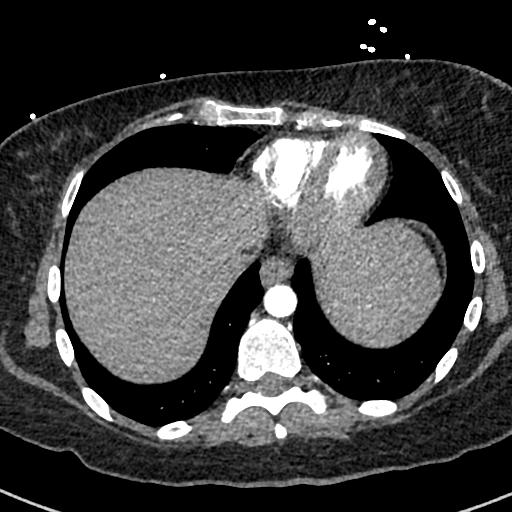
[im 144/369  lung]
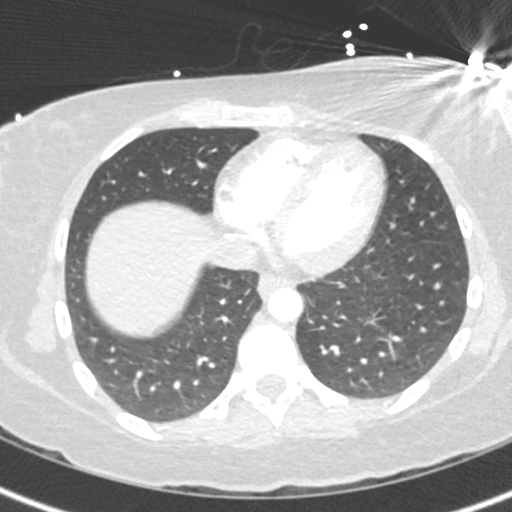
[im 164/369  soft-tissue]
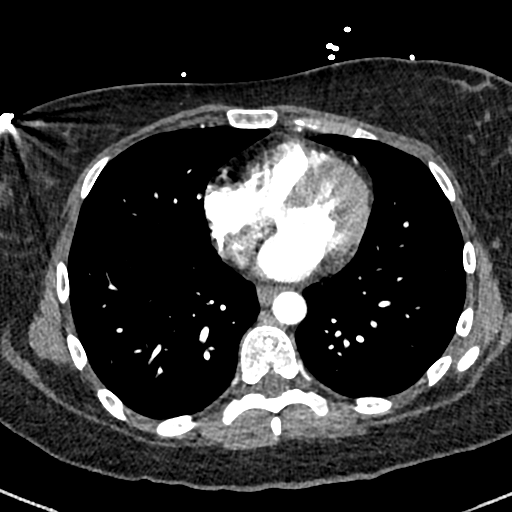
[im 205/369  lung]
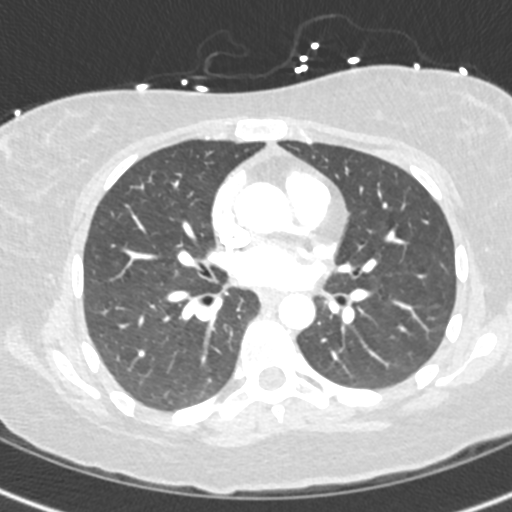
[im 225/369  soft-tissue]
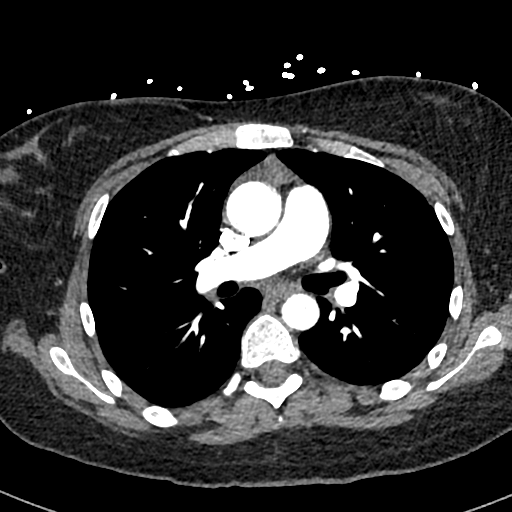
[im 246/369  lung]
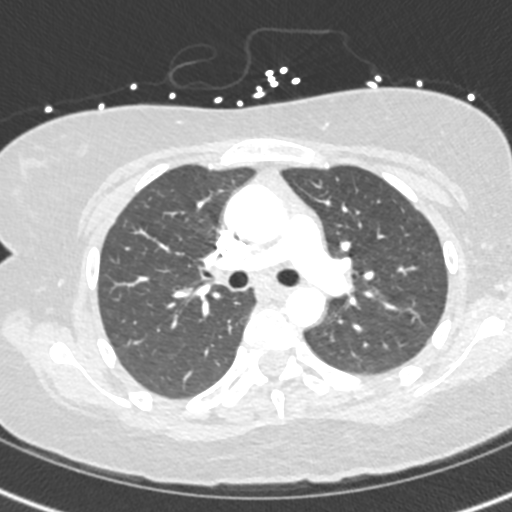
[im 266/369  soft-tissue]
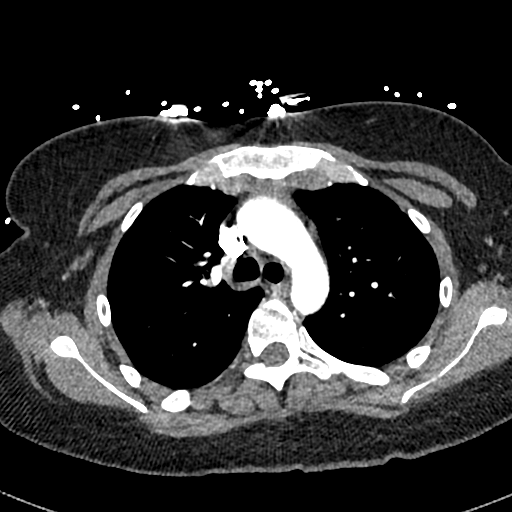
[im 287/369  lung]
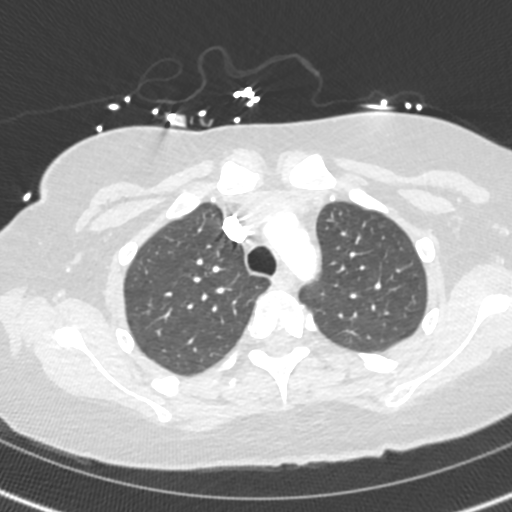
[im 307/369  soft-tissue]
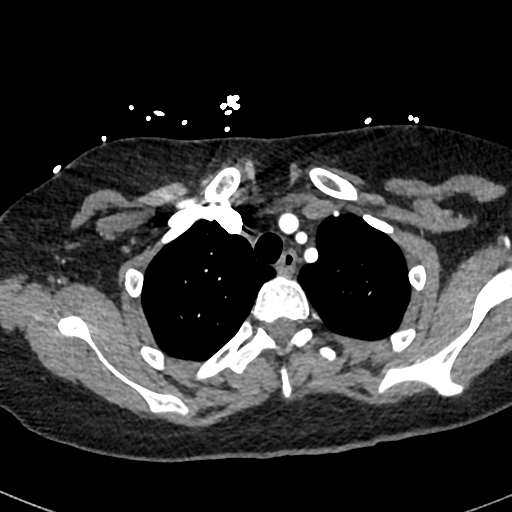
[im 328/369  lung]
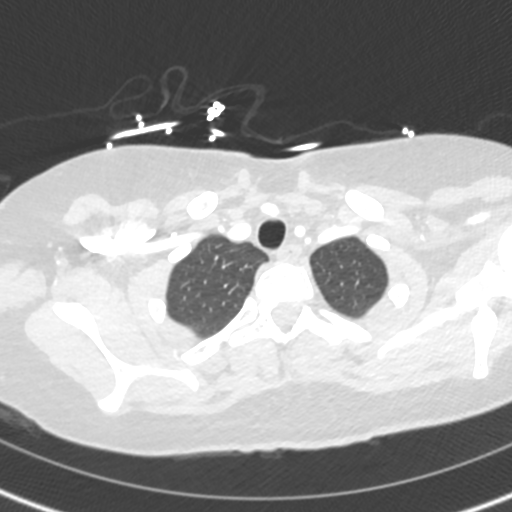
[im 348/369  soft-tissue]
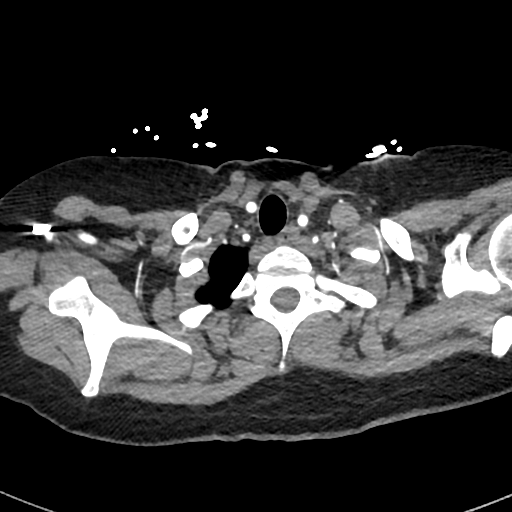

[Series 10: cor · coronal · 0.52mm/px · 3 of 123 slices shown]
[im 31/123  soft-tissue]
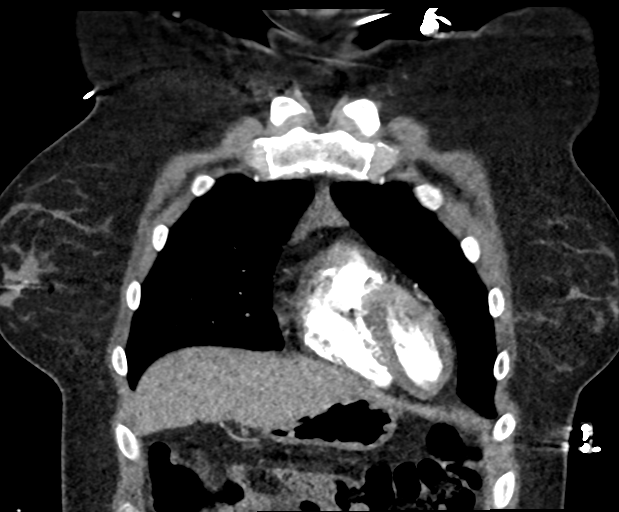
[im 62/123  soft-tissue]
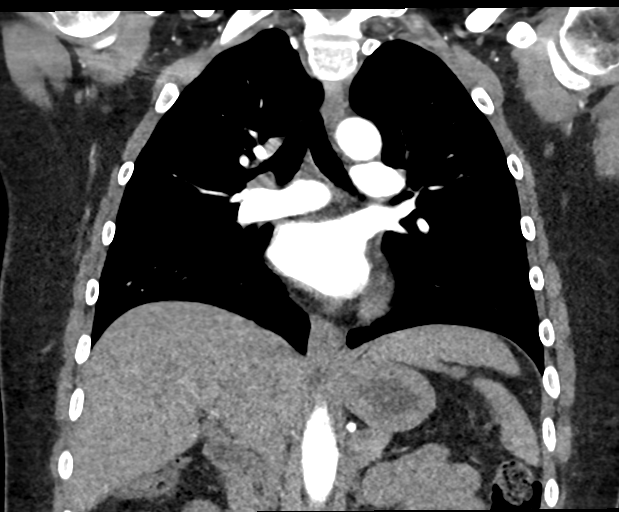
[im 92/123  soft-tissue]
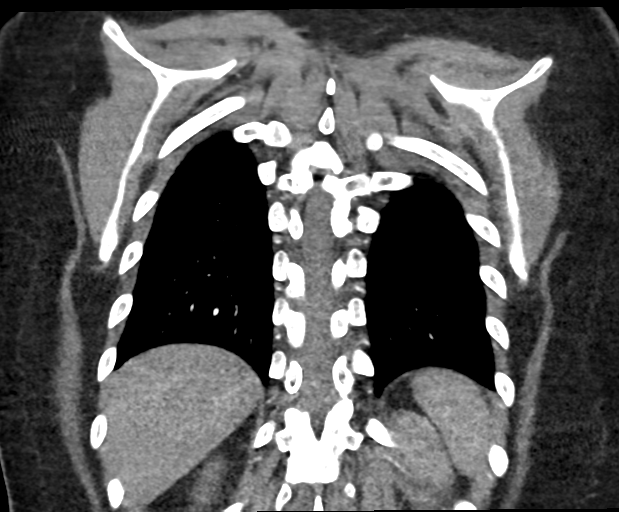

[19 of 46 positions shown; findings below may reference images not displayed]

FINDINGS: Cardiovascular: Satisfactory opacification of the pulmonary arteries
to the segmental level. No evidence of pulmonary embolism. Normal
heart size. No pericardial effusion.

Mediastinum/Nodes: No enlarged mediastinal, hilar, or axillary lymph
nodes. Thyroid gland, trachea, and esophagus demonstrate no
significant findings. There is a small volume of residual thymus in
the substernal space, appears normal for age.

Lungs/Pleura: Lungs are clear. No pleural effusion or pneumothorax.

Upper Abdomen: No acute abnormality.

Musculoskeletal: there is mild thoracic kyphosis. No spinal
compression fracture or acute skeletal findings.

Review of the MIP images confirms the above findings.
IMPRESSION: No acute chest CT or CTA findings.

## 2022-03-29 NOTE — Telephone Encounter (Signed)
Patient called and and she is scheduled at [facility] for 04/07/22. She informed me she is claustrophobic   and would need something to help her. She is aware to have a driver.  ?

## 2022-03-30 MED ORDER — ALPRAZOLAM 0.5 MG PO TABS: ORAL_TABLET | ORAL | 0 refills | Status: DC

## 2022-03-30 NOTE — Addendum Note (Signed)
Addended by: Joycelyn Schmid R on: 03/30/2022 02:06 PM ? ? Modules accepted: Orders ? ?

## 2022-03-30 NOTE — Telephone Encounter (Signed)
Meds ordered this encounter  ?Medications  ? ALPRAZolam (XANAX) 0.5 MG tablet  ?  Sig: for sedation before MRI scan; take 1 tab 1 hour before scan; may repeat 1 tab 15 min before scan  ?  Dispense:  3 tablet  ?  Refill:  0  ? ? ?Penni Bombard, MD [id number], 0000000 PM ?Certified in Neurology, Neurophysiology and Neuroimaging ? ?[facility] ?[address], [address] ?[place], [place] [address] ?[phone] ? ?

## 2022-03-31 ENCOUNTER — Other Ambulatory Visit: Payer: Self-pay | Admitting: Internal Medicine

## 2022-03-31 DIAGNOSIS — E1065 Type 1 diabetes mellitus with hyperglycemia: Secondary | ICD-10-CM

## 2022-04-05 NOTE — Telephone Encounter (Signed)
Updated Dillon Bjork: [id number] (exp. 04/05/22 to 05/04/22 ?

## 2022-04-07 ENCOUNTER — Ambulatory Visit: Payer: BC Managed Care – PPO

## 2022-04-07 DIAGNOSIS — I10 Essential (primary) hypertension: Secondary | ICD-10-CM

## 2022-04-07 DIAGNOSIS — R519 Headache, unspecified: Secondary | ICD-10-CM | POA: Diagnosis not present

## 2022-04-07 MED ORDER — GADOBENATE DIMEGLUMINE 529 MG/ML IV SOLN
20.0000 mL | Freq: Once | INTRAVENOUS | Status: AC | PRN
  Administered 2022-04-07: 20 mL via INTRAVENOUS

## 2022-06-15 ENCOUNTER — Institutional Professional Consult (permissible substitution): Payer: BC Managed Care – PPO | Admitting: Internal Medicine

## 2022-06-17 ENCOUNTER — Ambulatory Visit (INDEPENDENT_AMBULATORY_CARE_PROVIDER_SITE_OTHER): Payer: BC Managed Care – PPO | Admitting: Internal Medicine

## 2022-06-17 ENCOUNTER — Encounter: Payer: Self-pay | Admitting: Internal Medicine

## 2022-06-17 VITALS — BP 159/103 | HR 109 | Ht 67.0 in | Wt 208.2 lb

## 2022-06-17 DIAGNOSIS — R000 Tachycardia, unspecified: Secondary | ICD-10-CM | POA: Diagnosis not present

## 2022-06-17 DIAGNOSIS — L680 Hirsutism: Secondary | ICD-10-CM

## 2022-06-17 DIAGNOSIS — E876 Hypokalemia: Secondary | ICD-10-CM

## 2022-06-17 NOTE — Progress Notes (Signed)
ELECTROPHYSIOLOGY CONSULT NOTE  Patient ID: Veronica Liu, MRN: [id number], DOB/AGE: 1996-09-11 26 y.o. Admit date: (Not on file) Date of Consult: 06/17/2022  Primary Physician: Emeterio Reeve, DO Primary Cardiologist: Cherylann Ratel     Veronica Liu is a 26 y.o. female who is being seen today for the evaluation of POTS at the request of BCr.    HPI Veronica Liu is a 26 y.o. 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  Was working prior to [employer] and then was going to resume working in the fall 2022 when in October she she had the abrupt onset of while sitting, an episode of tachypalpitations.  It lasted about 2 hours.  She had an antecedent history of panic disorder so her mother thought maybe it was related to this (mother is a Marine scientist) however, since that time she has struggled.  Initially, she was largely bedbound for 2 to 3 months because of orthostatic tachy palpitations and lightheadedness.  She was seen in the ER 11/22 with orthostatics as noted below but notable for normal blood pressure and a change in heart rate of 85--150; her potassium was low at that time.  Over the ensuing months she has become less bedbound and more ambulatory; still limited with exercise tolerance because of dyspnea and palpitations.  She has also become increasingly hypertensive.  She was started on a beta-blocker but this was stopped because of concerns about nocturnal/recumbent low blood pressure.  Does not have syncope.  Significant heat intolerance, shower intolerance.  Orthostatic venous discoloration of the lower extremities.  Fluid intake is vigorous.  Salt intake is limited.  She stopped taking her psychotropic medications in the fall because of  concerns as to having might be interacting with the aforementioned symptoms    DATE TEST EF   2/21 Echo   60-65 %   12/22 Echo   65-70 %         Date Cr K Hgb  11/22 0.62 3.4 14.3            Past Medical History:  Diagnosis Date   Anxiety    Depression    Diabetes mellitus type I ([place])    onset age 75   Diabetic gastroparesis ([facility])    GERD (gastroesophageal reflux disease)       Surgical History:  Past Surgical History:  Procedure Laterality Date   NO PAST SURGERIES       Home Meds: Current Meds  Medication Sig   acetone, urine, test strip Check ketones per protocol   ALPRAZolam (XANAX) 0.5 MG tablet for sedation before MRI scan; take 1 tab 1 hour before scan; may repeat 1 tab 15 min before scan   BAYER MICROLET LANCETS lancets Check sugars 6 times daily and per protocol for hyper and hypoglycemia   BD PEN NEEDLE NANO 2ND GEN 32G X 4 MM MISC USE TO INJECT INSULIN VIA INSULIN PEN FIVE TIMES DAILY   Blood Glucose Monitoring Suppl (ONETOUCH VERIO) w/Device KIT Use to check blood sugar 4 times a day   Continuous Blood Gluc Receiver (DEXCOM G6 RECEIVER) DEVI For continuous blood glucose monitoring 4 times a day; E10.42   Continuous Blood Gluc Sensor (DEXCOM G6 SENSOR) MISC USE FOR CONTINUOUS BLOOD GLUCOSE MONITORING FOUR TIMES  A DAY   Continuous Blood Gluc Transmit (DEXCOM G6 TRANSMITTER) MISC USE FOR CONTINUOUS BLOOD GLUCOSE MONITORING FOUR TIMES A DAY   Glucagon 3 MG/DOSE POWD Place 3 mg into the nose once as needed for up to 1 dose.   glucose blood (ONETOUCH VERIO) test strip Checks blood sugar 4 times daily   ibuprofen (ADVIL) 200 MG tablet Take 200 mg by mouth every 6 (six) hours as needed for headache or mild pain.   Insulin Lispro-aabc (LYUMJEV KWIKPEN) 100 UNIT/ML KwikPen INJECT UNDER THE SKIN 4 TO 15 UNITS BEFORE MEALS (THREE TIMES A DAY)   SEMGLEE, YFGN, 100 UNIT/ML Pen INJECT 40 UNITS UNDER THE SKIN DAILY    Allergies:  Allergies  Allergen Reactions    Levemir [Insulin Detemir]     Hives at injection sites   Augmentin [Amoxicillin-Pot Clavulanate]     Vomiting as young child   Omnicef [Cefdinir]     Vomiting as young child.    Social History   Socioeconomic History   Marital status: Single    Spouse name: Not on file   Number of children: 0   Years of education: Not on file   Highest education level: GED or equivalent  Occupational History   Not on file  Tobacco Use   Smoking status: Former    Packs/day: 0.50    Years: 5.00    Total pack years: 2.50    Types: Cigarettes   Smokeless tobacco: Never   Tobacco comments:    NA  Vaping Use   Vaping Use: Never used  Substance and Sexual Activity   Alcohol use: Never   Drug use: Never   Sexual activity: Yes    Birth control/protection: None, Condom  Other Topics Concern   Not on file  Social History Narrative   Lives with mom, step-dad, brother, and sister. Sees bio-dad twice monthly.    No caffeine   Social Determinants of Radio broadcast assistant Strain: Not on file  Food Insecurity: Not on file  Transportation Needs: Not on file  Physical Activity: Not on file  Stress: Not on file  Social Connections: Not on file  Intimate Partner Violence: Not on file     Family History  Problem Relation Age of Onset   Hypothyroidism Mother    Cancer Mother        appendix cancer   Diabetes Father        type 2   Hypothyroidism Paternal Grandmother    Diabetes Paternal Grandmother        type 2   Diabetes Cousin        type 1     ROS:  Please see the history of present illness.      All other systems reviewed and negative.    Physical Exam:  Blood pressure (!) 159/103, pulse (!) 109, height _0  (1.702 m), weight 208 lb 3.2 oz (94.4 kg), SpO2 97 %. General: Well developed, well nourished female in no acute distress. Head: Normocephalic, atraumatic, sclera non-icteric, no xanthomas, nares are without discharge. EENT: normal  Lymph Nodes:  none Neck: Negative  for carotid bruits. JVD not elevated. Back:without scoliosis kyphosis  Lungs: Clear bilaterally to auscultation without wheezes, rales, or rhonchi. Breathing is unlabored. Heart: RRR with S1 S2. No  systolic murmur . No rubs, or gallops appreciated. Abdomen: Soft, non-tender, non-distended with normoactive bowel sounds. No hepatomegaly. No rebound/guarding. No obvious abdominal masses. Msk:  Strength and tone appear normal for age. Extremities: No  clubbing or cyanosis edema.  Distal pedal pulses are 2+ and equal bilaterally. Skin: Warm and Dry; hirsute Neuro: Alert and oriented X 3. CN III-XII intact Grossly normal sensory and motor function . Psych:  Responds to questions appropriately with a normal affect.        EKG: sinus @ 109/13/08/38   Assessment and Plan:  POTS/sinus tachycardia  Hypertension  Hypokalemia  Hirsute with menstrual irregularities  Manic depression, anxiety, borderline personality disorder?  Spectrum   The patient presented acutely 11/22 at which time her blood pressure was recorded in the 115 range and her heart rate change with standing was 88--150.  This would be consistent with POTS.  I share Dr. Clarisa Kindred perplexion regarding the hypertension.  This was not present initially.  And I wonder whether this is not a hyperadrenergic state, hence, we will try beta-blockers again and if these were unsuccessful we will try clonidine.  With her hypertension and her hypokalemia, will need to exclude hyper renin hypoaldosteronism.  We will start by rechecking her potassium.  Given that her sinus tachycardia is relatively constant now, we will also check a TSH and repeat her CBC  To this end we will give her prescriptions for atenolol 25, metoprolol succinate 25, bisoprolol 2.5 and nebivolol 2.5.  Initially we are looking simply for what she can take without making her other symptoms worse i.e. fatigue or psychomotor retardation etc.  With her irregular menses, and her  being hirsute I wonder whether she has PCOS.  We will check a testosterone level  Gave her the name of [facility] as a place of counseling which is paid for on a sliding scale and thus might be amenable to their limited income  Encouraged her to follow-up with her psychiatrist and resume her psychotropic medications        Virl Axe

## 2022-06-17 NOTE — Patient Instructions (Addendum)
Medication Instructions:  Your physician has recommended you make the following change in your medication:   ** You are being given four prescriptions for different beta blockers to try.  You may take these in any order. DO NOT TAKE MORE THAN ONE BETA BLOCKER AT A TIME. Choose one beta blocker to start with. If after two weeks, you feel the medication is working well for you, continue that current medication. If it does not work well for you, try another prescription for a beta blocker at that time. You may continue that current beta blocker at that time if it works well for you. If it does not, repeat these instructions for the third beta blocker.   When you find a beta blocker that works well for you, call the office and we will send in a prescription for you.                1. Atenolol 25mg  tablet             2. Metoprolol Succ 25mg  tablet             3. Bisoprolol 2.5mg  tablet             4. Nebivolol 5mg    You may take these in any order you please.    Please call with any questions.    *If you need a refill on your cardiac medications before your next appointment, please call your pharmacy*   Lab Work: BMET, CBC, TSH and Testosterone  If you have labs (blood work) drawn today and your tests are completely normal, you will receive your results only by: MyChart Message (if you have MyChart) OR A paper copy in the mail If you have any lab test that is abnormal or we need to change your treatment, we will call you to review the results.   Testing/Procedures: None ordered.    Follow-Up: At [facility], you and your health needs are our priority.  As part of our continuing mission to provide you with exceptional heart care, we have created designated Provider Care Teams.  These Care Teams include your primary Cardiologist (physician) and Advanced Practice Providers (APPs -  Physician Assistants and Nurse Practitioners) who all work together to provide you with the care  you need, when you need it.  We recommend signing up for the patient portal called "MyChart".  Sign up information is provided on this After Visit Summary.  MyChart is used to connect with patients for Virtual Visits (Telemedicine).  Patients are able to view lab/test results, encounter notes, upcoming appointments, etc.  Non-urgent messages can be sent to your provider as well.   To learn more about what you can do with MyChart, go to .    Your next appointment:   8 weeks with Dr [place]   Other Instructions [facility]  Compression wear as discussed by Dr [facility].  Hydration as discussed by Dr [email or web address]  Important Information About Sugar

## 2022-06-18 NOTE — Telephone Encounter (Signed)
Spoke with pt and reviewed beta blocker trial per Dr Graciela Husbands.  Rx's placed up front for pt pick up.  Pt verbalizes understanding of all instructions.  Updated AVS placed on pt's MyChart.

## 2022-06-22 ENCOUNTER — Other Ambulatory Visit: Payer: BC Managed Care – PPO

## 2022-06-22 DIAGNOSIS — R000 Tachycardia, unspecified: Secondary | ICD-10-CM | POA: Diagnosis not present

## 2022-06-22 DIAGNOSIS — L680 Hirsutism: Secondary | ICD-10-CM | POA: Diagnosis not present

## 2022-06-22 DIAGNOSIS — E876 Hypokalemia: Secondary | ICD-10-CM | POA: Diagnosis not present

## 2022-06-23 LAB — BASIC METABOLIC PANEL
BUN/Creatinine Ratio: 14 (ref 9–23)
BUN: 10 mg/dL (ref 6–20)
CO2: 22 mmol/L (ref 20–29)
Calcium: 9.2 mg/dL (ref 8.7–10.2)
Chloride: 99 mmol/L (ref 96–106)
Creatinine, Ser: 0.71 mg/dL (ref 0.57–1.00)
Glucose: 237 mg/dL — ABNORMAL HIGH (ref 70–99)
Potassium: 4.2 mmol/L (ref 3.5–5.2)
Sodium: 135 mmol/L (ref 134–144)
eGFR: 122 mL/min/{1.73_m2} (ref >59)

## 2022-06-23 LAB — CBC
Hematocrit: 44.9 % (ref 34.0–46.6)
Hemoglobin: 15.1 g/dL (ref 11.1–15.9)
MCH: 29.7 pg (ref 26.6–33.0)
MCHC: 33.6 g/dL (ref 31.5–35.7)
MCV: 88 fL (ref 79–97)
Platelets: 280 10*3/uL (ref 150–450)
RBC: 5.08 x10E6/uL (ref 3.77–5.28)
RDW: 12.6 % (ref 11.7–15.4)
WBC: 7.4 10*3/uL (ref 3.4–10.8)

## 2022-06-23 LAB — TESTOSTERONE: Testosterone: 34 ng/dL (ref 13–71)

## 2022-06-23 LAB — TSH: TSH: 1.29 u[IU]/mL (ref 0.450–4.500)

## 2022-07-23 ENCOUNTER — Ambulatory Visit: Payer: BC Managed Care – PPO | Admitting: Internal Medicine

## 2022-07-29 ENCOUNTER — Ambulatory Visit: Payer: BC Managed Care – PPO | Admitting: Internal Medicine

## 2022-08-12 ENCOUNTER — Encounter: Payer: Self-pay | Admitting: Internal Medicine

## 2022-08-12 ENCOUNTER — Ambulatory Visit: Payer: BC Managed Care – PPO | Attending: Interventional Cardiology | Admitting: Internal Medicine

## 2022-08-12 VITALS — BP 133/86 | HR 104 | Ht 67.0 in

## 2022-08-12 DIAGNOSIS — R000 Tachycardia, unspecified: Secondary | ICD-10-CM

## 2022-08-12 DIAGNOSIS — I10 Essential (primary) hypertension: Secondary | ICD-10-CM

## 2022-08-12 DIAGNOSIS — E876 Hypokalemia: Secondary | ICD-10-CM | POA: Diagnosis not present

## 2022-08-12 DIAGNOSIS — G90A Postural orthostatic tachycardia syndrome (POTS): Secondary | ICD-10-CM | POA: Diagnosis not present

## 2022-08-12 NOTE — Progress Notes (Signed)
Electrophysiology TeleHealth Note      Date:  08/12/2022   ID:  Veronica Liu, DOB 1995/12/30, MRN [id number]  Location: patient's home  Provider location: [address], [place]  Evaluation Performed: Follow-up visit  PCP:  Emeterio Reeve, DO  Cardiologist:   BCr Electrophysiologist:  SK   Chief Complaint:  tachypalpitations  History of Present Illness:    Veronica Liu is a 26 y.o. female who presents via audio/video conferencing for a telehealth visit today.  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  the patient reports She remains hypertensive at home and has tried the beta-blockers, metoprolol resulted in hypotension and atenolol hypotension and atenolol at 12.5 mg has controlled her heart rate but has been associated with fatigue and now nausea.Karolee Stamps has precluded compression  Testosterone level was normal  History of hypokalemia but most recent potassium level was 4.2.  (Have have 1 in the last months that the sensitivity of hypokalemia is not high for hyperaldo)       Past Medical History:  Diagnosis Date   Anxiety    Depression    Diabetes mellitus type I ([place])    onset age 17   Diabetic gastroparesis ([facility])    GERD (gastroesophageal reflux disease)     Past Surgical History:  Procedure Laterality Date   NO PAST SURGERIES      Current Outpatient Medications  Medication Sig Dispense Refill   acetone, urine, test strip Check ketones per protocol 50 each 3   atenolol (TENORMIN) 25 MG tablet Take 25 mg by mouth daily.     BAYER MICROLET LANCETS lancets Check sugars 6 times daily and per protocol for hyper and hypoglycemia 250 each 3   BD PEN NEEDLE NANO 2ND GEN 32G X 4 MM MISC USE TO INJECT INSULIN VIA INSULIN  PEN FIVE TIMES DAILY 500 each 3   Blood Glucose Monitoring Suppl (ONETOUCH VERIO) w/Device KIT Use to check blood sugar 4 times a day     Continuous Blood Gluc Receiver (DEXCOM G6 RECEIVER) DEVI For continuous blood glucose monitoring 4 times a day; E10.42 1 each 0   Continuous Blood Gluc Sensor (DEXCOM G6 SENSOR) MISC USE FOR CONTINUOUS BLOOD GLUCOSE MONITORING FOUR TIMES A DAY 9 each 3   Continuous Blood Gluc Transmit (DEXCOM G6 TRANSMITTER) MISC USE FOR CONTINUOUS BLOOD GLUCOSE MONITORING FOUR TIMES A DAY 1 each 3   Glucagon 3 MG/DOSE POWD Place 3 mg into the nose once as needed for up to 1 dose. 1 each 11   glucose blood (ONETOUCH VERIO) test strip Checks blood sugar 4 times daily 400 each 11   ibuprofen (ADVIL) 200 MG tablet Take 200 mg by mouth every 6 (six) hours as needed for headache or mild pain.     Insulin Lispro-aabc (LYUMJEV KWIKPEN) 100 UNIT/ML KwikPen INJECT UNDER THE SKIN 4 TO 15 UNITS BEFORE MEALS (THREE TIMES A DAY) 45 mL 3   SEMGLEE, YFGN, 100 UNIT/ML Pen INJECT 40 UNITS UNDER THE SKIN DAILY 45 mL 1   No current facility-administered medications for this visit.    Allergies:   Levemir [insulin detemir], Augmentin [amoxicillin-pot clavulanate], and Omnicef [cefdinir]   Social History:  The patient  reports that she has quit smoking. Her smoking use included cigarettes. She has  a 2.50 pack-year smoking history. She has never used smokeless tobacco. She reports that she does not drink alcohol and does not use drugs.   Family History:  The patient's   family history includes Cancer in her mother; Diabetes in her cousin, father, and paternal grandmother; Hypothyroidism in her mother and paternal grandmother.   ROS:  Please see the history of present illness.   All other systems are personally reviewed and negative.    Exam:    Vital Signs:  BP 133/86 (Patient Position: Standing)   Pulse (!) 104   Ht 5' 7"  (1.702 m)   LMP 08/08/2022   BMI 32.61 kg/m         Labs/Other  Tests and Data Reviewed:    Recent Labs: 10/11/2021: ALT 12; B Natriuretic Peptide 5.0 10/12/2021: Magnesium 1.8 06/22/2022: BUN 10; Creatinine, Ser 0.71; Hemoglobin 15.1; Platelets 280; Potassium 4.2; Sodium 135; TSH 1.290   Wt Readings from Last 3 Encounters:  06/17/22 208 lb 3.2 oz (94.4 kg)  03/24/22 205 lb (93 kg)  03/19/22 200 lb 6.4 oz (90.9 kg)     Other studies personally reviewed:      ASSESSMENT & PLAN:    POTS/sinus tachycardia   Hypertension   Hypokalemia intermittent   Hirsute with menstrual irregularities   Manic depression, anxiety, borderline personality disorder?  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  We will continue low-dose beta-blockers, she will be working on nebivolol and bisoprolol.  If these are not effective, we will try a very low-dose of clonidine.  She will be working with compression in the interim.  Having more problems of migraines and that she follow-up with neurology    Follow-up: 8-10 weeks    Current medicines are reviewed at length with the patient today.   The patient has concerns regarding her medicines  (As above) .  The following changes were made today:  (As above)   Labs/ tests ordered today include:   No orders of the defined types were placed in this encounter.     Today, I have spent 21 minutes with the patient with telehealth technology discussing the above.  Signed, Virl Axe, MD  08/12/2022 5:45 PM     [facility] [address] [place] [address] [phone] (office) [phone] (fax)

## 2022-08-12 NOTE — Patient Instructions (Signed)
Medication Instructions:  Your physician recommends that you continue on your current medications as directed. Please refer to the Current Medication list given to you today.  ** as discussed by Dr Graciela Husbands   *If you need a refill on your cardiac medications before your next appointment, please call your pharmacy*   Lab Work: None ordered.  If you have labs (blood work) drawn today and your tests are completely normal, you will receive your results only by: MyChart Message (if you have MyChart) OR A paper copy in the mail If you have any lab test that is abnormal or we need to change your treatment, we will call you to review the results.   Testing/Procedures: None ordered.    Follow-Up: At [facility], you and your health needs are our priority.  As part of our continuing mission to provide you with exceptional heart care, we have created designated Provider Care Teams.  These Care Teams include your primary Cardiologist (physician) and Advanced Practice Providers (APPs -  Physician Assistants and Nurse Practitioners) who all work together to provide you with the care you need, when you need it.  We recommend signing up for the patient portal called "MyChart".  Sign up information is provided on this After Visit Summary.  MyChart is used to connect with patients for Virtual Visits (Telemedicine).  Patients are able to view lab/test results, encounter notes, upcoming appointments, etc.  Non-urgent messages can be sent to your provider as well.   To learn more about what you can do with MyChart, go to [email or web address].    Your next appointment:   10 week telephone visit with Dr Graciela Husbands - scheduler will call you to set up this appointment.   Important Information About Sugar

## 2022-08-31 ENCOUNTER — Telehealth: Payer: Self-pay | Admitting: Internal Medicine

## 2022-08-31 NOTE — Telephone Encounter (Signed)
Pt calling to set up 10 week televisit f/u and she also is calling about a jury duty that she needs written. Please advise

## 2022-10-28 ENCOUNTER — Other Ambulatory Visit: Payer: Self-pay | Admitting: Internal Medicine

## 2022-10-28 DIAGNOSIS — E1065 Type 1 diabetes mellitus with hyperglycemia: Secondary | ICD-10-CM

## 2022-11-04 ENCOUNTER — Encounter: Payer: Self-pay | Admitting: Internal Medicine

## 2022-11-04 ENCOUNTER — Ambulatory Visit: Payer: BC Managed Care – PPO | Attending: Internal Medicine | Admitting: Internal Medicine

## 2022-11-04 VITALS — BP 118/95 | HR 104 | Ht 67.0 in | Wt 202.0 lb

## 2022-11-04 DIAGNOSIS — G90A Postural orthostatic tachycardia syndrome (POTS): Secondary | ICD-10-CM | POA: Diagnosis not present

## 2022-11-04 DIAGNOSIS — R000 Tachycardia, unspecified: Secondary | ICD-10-CM | POA: Diagnosis not present

## 2022-11-04 NOTE — Patient Instructions (Addendum)
Medication Instructions:  Your physician recommends that you continue on your current medications as directed. Please refer to the Current Medication list given to you today.  *If you need a refill on your cardiac medications before your next appointment, please call your pharmacy*   Lab Work: None ordered.  If you have labs (blood work) drawn today and your tests are completely normal, you will receive your results only by: MyChart Message (if you have MyChart) OR A paper copy in the mail If you have any lab test that is abnormal or we need to change your treatment, we will call you to review the results.   Testing/Procedures: None ordered.    Follow-Up: At [facility], you and your health needs are our priority.  As part of our continuing mission to provide you with exceptional heart care, we have created designated Provider Care Teams.  These Care Teams include your primary Cardiologist (physician) and Advanced Practice Providers (APPs -  Physician Assistants and Nurse Practitioners) who all work together to provide you with the care you need, when you need it.  We recommend signing up for the patient portal called "MyChart".  Sign up information is provided on this After Visit Summary.  MyChart is used to connect with patients for Virtual Visits (Telemedicine).  Patients are able to view lab/test results, encounter notes, upcoming appointments, etc.  Non-urgent messages can be sent to your provider as well.   To learn more about what you can do with MyChart, go to [email or web address].    Your next appointment:   6 months with Dr Graciela Husbands  Other Instructions Please send [place] Orthostatic BP readings next week via MyChart  Important Information About Sugar

## 2022-11-04 NOTE — Progress Notes (Signed)
Electrophysiology TeleHealth Note      Date:  11/04/2022   ID:  Veronica Liu, Veronica Liu 04/02/1996, MRN [id number]  Location: patient's home  Provider location: [address], [place]  Evaluation Performed: Follow-up visit  PCP:  Veronica Reeve, DO  Cardiologist:   BCr Electrophysiologist:  SK   Chief Complaint:    History of Present Illness:    Veronica Liu (calls herself [name])is a 26 y.o. female who presents via audio/video conferencing for a telehealth visit today.  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  Over recent weeks she is struggled with dizziness which is nonpositional as well as positional.  Has not been able to take a shower standing up.  Increasingly isolated but denies that is a problem.  History of anxiety and depression with some suicidal ideation including active but denies purpose    The patient denies symptoms of fevers, chills, cough, or new SOB worrisome for COVID 19.    Past Medical History:  Diagnosis Date   Anxiety    Depression    Diabetes mellitus type I ([place])    onset age 26   Diabetic gastroparesis ([place])    GERD (gastroesophageal reflux disease)     Past Surgical History:  Procedure Laterality Date   NO PAST SURGERIES      Current Outpatient Medications  Medication Sig Dispense Refill   acetone, urine, test strip Check ketones per protocol 50 each 3   BAYER MICROLET LANCETS lancets Check sugars 6 times daily and per protocol for hyper and hypoglycemia 250 each 3   BD PEN NEEDLE NANO 2ND GEN 32G X 4 MM MISC USE TO INJECT INSULIN VIA INSULIN PEN FIVE TIMES DAILY 500 each 3   Blood Glucose Monitoring Suppl (ONETOUCH VERIO) w/Device KIT Use to check blood sugar 4 times a day     Continuous  Blood Gluc Receiver (DEXCOM G6 RECEIVER) DEVI For continuous blood glucose monitoring 4 times a day; E10.42 1 each 0   Continuous Blood Gluc Sensor (DEXCOM G6 SENSOR) MISC USE FOR CONTINUOUS BLOOD GLUCOSE MONITORING FOUR TIMES A DAY 9 each 3   Continuous Blood Gluc Transmit (DEXCOM G6 TRANSMITTER) MISC USE FOR CONTINUOUS BLOOD GLUCOSE MONITORING FOUR TIMES A DAY 1 each 1   Glucagon 3 MG/DOSE POWD Place 3 mg into the nose once as needed for up to 1 dose. 1 each 11   glucose blood (ONETOUCH VERIO) test strip Checks blood sugar 4 times daily 400 each 11   ibuprofen (ADVIL) 200 MG tablet Take 200 mg by mouth every 6 (six) hours as needed for headache or mild pain.     Insulin Lispro-aabc (LYUMJEV KWIKPEN) 100 UNIT/ML KwikPen INJECT UNDER THE SKIN 4 TO 15 UNITS BEFORE MEALS (THREE TIMES A DAY) 45 mL 3   SEMGLEE, YFGN, 100 UNIT/ML Pen INJECT 40 UNITS UNDER THE SKIN DAILY 45 mL 1   atenolol (TENORMIN) 25 MG tablet Take 25 mg by mouth daily. (Patient not taking: Reported on 11/04/2022)     No current facility-administered medications for this visit.    Allergies:   Levemir [insulin detemir], Augmentin [amoxicillin-pot clavulanate], and Omnicef [cefdinir]   Social History:  The patient  reports that she has quit smoking. Her smoking use included cigarettes. She has a  2.50 pack-year smoking history. She has never used smokeless tobacco. She reports that she does not drink alcohol and does not use drugs.   Family History:  The patient's   family history includes Cancer in her mother; Diabetes in her cousin, father, and paternal grandmother; Hypothyroidism in her mother and paternal grandmother.   ROS:  Please see the history of present illness.   All other systems are personally reviewed and negative.    Exam:    Vital Signs:  BP (!) 118/95 (Patient Position: Standing)   Pulse (!) 104   Ht _0  (1.702 m)   Wt 202 lb (91.6 kg)   LMP 10/27/2022 (Approximate)   BMI 31.64 kg/m    Well appearing,  alert and conversant, regular work of breathing,  good skin color Eyes- anicteric, neuro- grossly intact, skin- no apparent rash or lesions or cyanosis, mouth- oral mucosa is pink   Labs/Other Tests and Data Reviewed:    Recent Labs: 06/22/2022: BUN 10; Creatinine, Ser 0.71; Hemoglobin 15.1; Platelets 280; Potassium 4.2; Sodium 135; TSH 1.290   Wt Readings from Last 3 Encounters:  11/04/22 202 lb (91.6 kg)  06/17/22 208 lb 3.2 oz (94.4 kg)  03/24/22 205 lb (93 kg)     Other studies personally reviewed: Additional studies/ records that were reviewed today include     ASSESSMENT & PLAN:    POTS/sinus tachycardia   Hypertension   Hypokalemia   Hirsute with menstrual irregularities   Manic depression, anxiety, borderline personality disorder?  Spectrum  Not sure why it was that she is poorly able to tolerate beta-blockers given her blood pressures previously recorded in the 140/90 range.  Blood pressures today are in the 120 range.  We discussed the possible use of low-dose clonidine as a central sympatholytic for her tachycardia.  She will get some orthostatic measurements we will get a sense of what her blood pressure is prior to initiating  Discussed again the importance of volume repletion      Follow-up: We will touch base next week after she sends [place] some recordings from her orthostatic blood pressures    Current medicines are reviewed at length with the patient today.   The patient  concerns regarding her medicines.  The following changes were made today:    Labs/ tests ordered today include:  No orders of the defined types were placed in this encounter.     Today, I have spent 11 minutes with the patient with telehealth technology discussing the above.  Signed, Veronica Axe, MD  11/04/2022 5:28 PM     [facility] [address] [place] [address] [phone] (office) [phone] (fax)

## 2022-11-10 ENCOUNTER — Encounter: Payer: Self-pay | Admitting: Internal Medicine

## 2022-11-16 ENCOUNTER — Ambulatory Visit (INDEPENDENT_AMBULATORY_CARE_PROVIDER_SITE_OTHER): Payer: BC Managed Care – PPO | Admitting: Internal Medicine

## 2022-11-16 ENCOUNTER — Encounter: Payer: Self-pay | Admitting: Internal Medicine

## 2022-11-16 VITALS — BP 144/100 | HR 131 | Ht 67.0 in | Wt 217.8 lb

## 2022-11-16 DIAGNOSIS — E1065 Type 1 diabetes mellitus with hyperglycemia: Secondary | ICD-10-CM | POA: Diagnosis not present

## 2022-11-16 DIAGNOSIS — E663 Overweight: Secondary | ICD-10-CM

## 2022-11-16 LAB — POCT GLYCOSYLATED HEMOGLOBIN (HGB A1C): Hemoglobin A1C: 9.7 % — AB (ref 4.0–5.6)

## 2022-11-16 MED ORDER — GLUCAGON 3 MG/DOSE NA POWD: 3.0000 mg | Freq: Once | NASAL | 11 refills | Status: DC | PRN

## 2022-11-16 MED ORDER — METOPROLOL TARTRATE 25 MG PO TABS: 12.5000 mg | ORAL_TABLET | Freq: Two times a day (BID) | ORAL | 1 refills | Status: DC

## 2022-11-16 MED ORDER — INSULIN GLARGINE-YFGN 100 UNIT/ML ~~LOC~~ SOPN: PEN_INJECTOR | SUBCUTANEOUS | 3 refills | Status: DC

## 2022-11-16 NOTE — Patient Instructions (Addendum)
Please continue: - Semglee             35 >> 40 in am              20 units in the evening  - Lyumjev: 20-25 units before meals >> but take 30 units before b'fast - Lyumjev sliding scale: Target 100 ISF 20    Check with your insurance if the following pumps are covered: - t:slim - iLet - Omnipod - Medtronic  Please schedule an appt with Cristy Folks for diabetes education - [phone].  Please return in 3-4 months.

## 2022-11-16 NOTE — Progress Notes (Signed)
Patient ID: Veronica Liu, female   DOB: May 01, 1996, 26 y.o.   MRN: [id number]  HPI: Veronica Liu is a 26 y.o.-year-old female, returning for follow-up for DM1, dx'ed at 26 y/o as DM2, and DM1 at 26 y/o, on insulin since 26 y/o uncontrolled, with long-term complications (PN, gastoparesis).  She was previously followed by Dr. Tobe Sos and then Annia Belt with pediatric endocrinology at [facility].  Last visit with 8 months ago.  Interim history: No increased urination, blurry vision.  However, she still feels poorly and has a new diagnosis of POTS. She has a reverse circadian rhythm, staying up during the night and waking up around 3-4 PM.  Prev. Skipping insulin doses b/c of this.  She has OCD and if she cannot let exactly 12 hours between the doses, she was skipping Semglee. She does better with this now.  Reviewed HbA1c levels: Lab Results  Component Value Date   HGBA1C 9.2 (A) 03/19/2022   HGBA1C 8.6 (H) 10/12/2021   HGBA1C 8.0 (A) 07/28/2021   HGBA1C 8.6 (A) 03/17/2021   HGBA1C 8.0 (A) 12/16/2020   HGBA1C 8.1 (A) 09/15/2020   HGBA1C 9.0 (A) 04/01/2020   HGBA1C 9.1 (A) 10/09/2019   HGBA1C 9.0 (A) 06/07/2019   HGBA1C 9.1 (A) 02/01/2019   HGBA1C 9.2 (A) 05/19/2018   HGBA1C 8.7 08/05/2014   HGBA1C 9.9 03/05/2014   HGBA1C 8.0 09/06/2013   HGBA1C 10.8 06/19/2013   HGBA1C 12.5 (H) 06/04/2013   HGBA1C 14.8 (H) 01/17/2013  02/27/2018: HbA1c 9.4% 11/11/2017: HbA1c 8.8% 08/16/2017: HbA1c 8.9% 04/18/2017: HbA1c 9.3% 06/23/2016: HbA1c 12%  Previously on: -Lantus 40 >> 34 units at bedtime >> 17 >> 25 units 2x a day >> Tresiba 40 units daily (not covered) >> Semglee 25 units 2x a day -Humalog >> Lyumjev: ICR  (not actually using an ICR) - 12-14 units -before her dinner and 6-8 units before the other meals Target 100 ISF 25 >> 20 We tried Metformin 1000 mg with dinner-added 06/2019 >> stopped due to diarrhea.  Currently on: - Semglee             35 units in am             20 units  in the evening  - Lyumjev: 20-25 units before meals - Lyumjev sliding scale: Target 100 ISF 20  She checks her sugars more than 4 times a day with her Dexcom CGM:  Prev.:   Previously:   Lowest sugar was 60 >> 70 >> 50s x1 in the middle of the night >> 70.  No previous hypoglycemia admissions.  She does have a glucagon kit at home. Highest sugar was 300 >> >400 >> HI >> HI.  She had an ED visit for DKA on 10/11/2021.  She also has a [employer].  Pt's meals are: - Breakfast: skips b/c gastroparesis (nausea) - prev. Reglan, now phenergan - Lunch: sandwich + chips - Dinner: meat + veggies + bread - Snacks: cheese sticks, carrots  No CKD: Lab Results  Component Value Date   BUN 10 06/22/2022   BUN 8 10/12/2021   CREATININE 0.71 06/22/2022   CREATININE 0.62 10/12/2021   + HL: Lab Results  Component Value Date   CHOL 239 (H) 11/10/2020   HDL 66.40 11/10/2020   LDLCALC 155 (H) 11/10/2020   TRIG 89.0 11/10/2020   CHOLHDL 4 11/10/2020  She is not on a statin.    - last eye exam was in 05/2020: No DR  reportedly.  -+ Occasional numbness and tingling in her feet. Last foot exam 03/19/2022.  Latest TSH was normal: Lab Results  Component Value Date   TSH 1.290 06/22/2022   Pt has FH of DM2 in father and PGM.  Cousin with DM1.  She has a history of depression.  In fall of 2022, she had a panic attack, after which she started to feel very dizzy and feel very poorly.  Her blood pressure started to fluctuate significantly and she also started to have nausea, abdominal pain, anxiety, palpitations, shortness of breath, chest pain, blurry vision, daily migraines, constipation, general muscle aches.  She was investigated by cardiology and had a Holter monitor and also had a 2D echo.  Heart investigation was negative.  She was dx'ed with POTS - sees Dr. Caryl Comes. She tried metoprolol but blood pressure got too low while lying down.  She was smoking half a pack a day >>  quit in 10/2021.  ROS: + see HPI  I reviewed pt's medications, allergies, PMH, social hx, family hx, and changes were documented in the history of present illness. Otherwise, unchanged from my initial visit note.  Past Medical History:  Diagnosis Date   Anxiety    Depression    Diabetes mellitus type I ([place])    onset age 55   Diabetic gastroparesis ([place])    GERD (gastroesophageal reflux disease)    Past Surgical History:  Procedure Laterality Date   NO PAST SURGERIES     Social History   Socioeconomic History   Marital status: Single    Spouse name: Not on file   Number of children: 0  Occupational History   N/a  Tobacco Use   Smoking status: Current Every Day Smoker    Packs/day: 0.50    Years: 5.00    Pack years: 2.50    Types: Cigarettes   Smokeless tobacco: Never Used   Tobacco comment: NA  Substance and Sexual Activity   Alcohol use: Never    Frequency: Never   Drug use: Never   Sexual activity: Yes    Birth control/protection: None, Condom  Social History Narrative   Lives with mom, step-dad, brother, and sister. Sees bio-dad twice monthly.    Current Outpatient Medications on File Prior to Visit  Medication Sig Dispense Refill   acetone, urine, test strip Check ketones per protocol 50 each 3   BAYER MICROLET LANCETS lancets Check sugars 6 times daily and per protocol for hyper and hypoglycemia 250 each 3   BD PEN NEEDLE NANO 2ND GEN 32G X 4 MM MISC USE TO INJECT INSULIN VIA INSULIN PEN FIVE TIMES DAILY 500 each 3   Blood Glucose Monitoring Suppl (ONETOUCH VERIO) w/Device KIT Use to check blood sugar 4 times a day     Continuous Blood Gluc Receiver (DEXCOM G6 RECEIVER) DEVI For continuous blood glucose monitoring 4 times a day; E10.42 1 each 0   Continuous Blood Gluc Sensor (DEXCOM G6 SENSOR) MISC USE FOR CONTINUOUS BLOOD GLUCOSE MONITORING FOUR TIMES A DAY 9 each 3   Continuous Blood Gluc Transmit (DEXCOM G6 TRANSMITTER) MISC USE FOR CONTINUOUS BLOOD  GLUCOSE MONITORING FOUR TIMES A DAY 1 each 1   Glucagon 3 MG/DOSE POWD Place 3 mg into the nose once as needed for up to 1 dose. 1 each 11   glucose blood (ONETOUCH VERIO) test strip Checks blood sugar 4 times daily 400 each 11   ibuprofen (ADVIL) 200 MG tablet Take 200 mg by mouth every 6 (  six) hours as needed for headache or mild pain.     Insulin Lispro-aabc (LYUMJEV KWIKPEN) 100 UNIT/ML KwikPen INJECT UNDER THE SKIN 4 TO 15 UNITS BEFORE MEALS (THREE TIMES A DAY) 45 mL 3   metoprolol tartrate (LOPRESSOR) 25 MG tablet Take 0.5 tablets (12.5 mg total) by mouth 2 (two) times daily. 90 tablet 1   SEMGLEE, YFGN, 100 UNIT/ML Pen INJECT 40 UNITS UNDER THE SKIN DAILY 45 mL 1   No current facility-administered medications on file prior to visit.   Allergies  Allergen Reactions   Levemir [Insulin Detemir]     Hives at injection sites   Augmentin [Amoxicillin-Pot Clavulanate]     Vomiting as young child   Omnicef [Cefdinir]     Vomiting as young child.   Family History  Problem Relation Age of Onset   Hypothyroidism Mother    Cancer Mother        appendix cancer   Diabetes Father        type 2   Hypothyroidism Paternal Grandmother    Diabetes Paternal Grandmother        type 2   Diabetes Cousin        type 1   PE: BP (!) 144/100 (BP Location: Left Arm, Patient Position: Sitting, Cuff Size: Normal)   Pulse (!) 131   Ht _0  (1.702 m)   Wt 217 lb 12.8 oz (98.8 kg)   LMP 10/27/2022 (Approximate)   SpO2 99%   BMI 34.11 kg/m   Wt Readings from Last 3 Encounters:  11/16/22 217 lb 12.8 oz (98.8 kg)  11/04/22 202 lb (91.6 kg)  06/17/22 208 lb 3.2 oz (94.4 kg)   Constitutional: overweight, in NAD Eyes:  EOMI, no exophthalmos ENT: no neck masses, no cervical lymphadenopathy Cardiovascular: tachycardia, RR, No MRG Respiratory: CTA B Musculoskeletal: no deformities Skin:no rashes Neurological: no tremor with outstretched hands   ASSESSMENT: 1. DM1, uncontrolled, without  long-term complications, but with hyperglycemia  2. Overweight  PLAN:  1. Patient with longstanding, uncontrolled, type 1 diabetes, on basal bolus insulin regimen, with very poor control.  We did discuss about the possibility of starting an insulin pump in the past, but she did not feel ready to start this due to the complexities of the pump.  At last visit, she felt poorly, with dizziness, and nausea, and was not able to eat well.  Sugars were quite elevated and staying high throughout the day and dropping closer to the normal range only between approximately 10 AM and 5 PM.  She was missing Semglee many times a week and we discussed about trying taking it consistently.  I also advised her to bolus before every meal, even if she had small meals. CGM interpretation: -At today's visit, we reviewed her CGM downloads: It appears that 15% of values are in target range (goal >70%), while 85% are higher than 180 (goal <25%), and 0% are lower than 70 (goal <4%).  The calculated average blood sugar is 255.  The projected HbA1c for the next 3 months (GMI) is 9.4%. -Reviewing the CGM trends, sugars are high throughout the day, barely touching the normal range.  Her best blood sugars are between 3 AM and 9 AM, after which they increase and remain elevated throughout the day and night.  She mentions that she is taking Semglee consistently and also Lyumjev.  She does not feel that she has scar tissue at the injection sites. -At today's visit, we discussed that this regimen is  not working well for her.  I suggested an insulin pump and she agrees to try this.  I advised her to call her insurance and see which insulin pump is covered for her.  Afterwards, she needs to schedule an appointment with the diabetes educator.  She agrees to do this.  We will need to use a more concentrated insulin in the pump, since she uses high doses of insulin.  Until that, we will increase her Semglee in the morning and also Lyumjev with  breakfast. -I suggested to: Patient Instructions  Please continue: - Semglee             35 >> 40 in am              20 units in the evening  - Lyumjev: 20-25 units before meals >> but take 30 units before b'fast - Lyumjev sliding scale: Target 100 ISF 20    Check with your insurance if the following pumps are covered: - t:slim - iLet - Omnipod - Medtronic  Please schedule an appt with Leonia Reader for diabetes education - [phone].  Please return in 3-4 months.  - we checked her HbA1c: 9.7% (higher) - advised to check sugars at different times of the day - 4x a day, rotating check times - advised for yearly eye exams >> she is not UTD -as she tells me she cannot afford it for now - return to clinic in 3-4 months  2. Overweight -At last visit, we discussed about the importance of improving diet.  Her diet is hindered by her reverse circadian rhythm and also by the fact that she was only eating 1 meal a day, dinner, around 5-6 PM.  We did discuss about trying to spread her meals throughout the day.  -she gained 22 lbs before last OV - not able to exercise 2/2 POTS - nausea, fatigue -she gained 17 lbs since last OV  Philemon Kingdom, MD PhD [facility] Endocrinology

## 2022-11-30 ENCOUNTER — Other Ambulatory Visit: Payer: Self-pay | Admitting: Internal Medicine

## 2022-11-30 DIAGNOSIS — E1065 Type 1 diabetes mellitus with hyperglycemia: Secondary | ICD-10-CM

## 2022-12-09 ENCOUNTER — Telehealth: Payer: Self-pay

## 2022-12-09 ENCOUNTER — Emergency Department (HOSPITAL_COMMUNITY)
Admission: EM | Admit: 2022-12-09 | Discharge: 2022-12-09 | Discharge status: Against Medical Advice | Payer: BC Managed Care – PPO | Attending: Emergency Medicine | Admitting: Emergency Medicine

## 2022-12-09 ENCOUNTER — Other Ambulatory Visit: Payer: Self-pay

## 2022-12-09 ENCOUNTER — Encounter (HOSPITAL_COMMUNITY): Payer: Self-pay | Admitting: Emergency Medicine

## 2022-12-09 ENCOUNTER — Emergency Department (HOSPITAL_COMMUNITY): Payer: BC Managed Care – PPO

## 2022-12-09 DIAGNOSIS — R42 Dizziness and giddiness: Secondary | ICD-10-CM | POA: Insufficient documentation

## 2022-12-09 DIAGNOSIS — Z5321 Procedure and treatment not carried out due to patient leaving prior to being seen by health care provider: Secondary | ICD-10-CM | POA: Insufficient documentation

## 2022-12-09 DIAGNOSIS — G4489 Other headache syndrome: Secondary | ICD-10-CM | POA: Diagnosis not present

## 2022-12-09 DIAGNOSIS — R0789 Other chest pain: Secondary | ICD-10-CM | POA: Diagnosis not present

## 2022-12-09 DIAGNOSIS — R0602 Shortness of breath: Secondary | ICD-10-CM | POA: Insufficient documentation

## 2022-12-09 DIAGNOSIS — R079 Chest pain, unspecified: Secondary | ICD-10-CM | POA: Insufficient documentation

## 2022-12-09 DIAGNOSIS — R110 Nausea: Secondary | ICD-10-CM | POA: Diagnosis not present

## 2022-12-09 HISTORY — DX: Tuberculosis of spine: A18.01

## 2022-12-09 LAB — BASIC METABOLIC PANEL
Anion gap: 12 (ref 5–15)
BUN: 8 mg/dL (ref 6–20)
CO2: 21 mmol/L — ABNORMAL LOW (ref 22–32)
Calcium: 9.1 mg/dL (ref 8.9–10.3)
Chloride: 102 mmol/L (ref 98–111)
Creatinine, Ser: 0.63 mg/dL (ref 0.44–1.00)
GFR, Estimated: >60 mL/min (ref >60)
Glucose, Bld: 235 mg/dL — ABNORMAL HIGH (ref 70–99)
Potassium: 3.9 mmol/L (ref 3.5–5.1)
Sodium: 135 mmol/L (ref 135–145)

## 2022-12-09 LAB — CBC
HCT: 47.4 % — ABNORMAL HIGH (ref 36.0–46.0)
Hemoglobin: 16.1 g/dL — ABNORMAL HIGH (ref 12.0–15.0)
MCH: 30 pg (ref 26.0–34.0)
MCHC: 34 g/dL (ref 30.0–36.0)
MCV: 88.3 fL (ref 80.0–100.0)
Platelets: 315 10*3/uL (ref 150–400)
RBC: 5.37 MIL/uL — ABNORMAL HIGH (ref 3.87–5.11)
RDW: 12.5 % (ref 11.5–15.5)
WBC: 10.2 10*3/uL (ref 4.0–10.5)
nRBC: 0 % (ref 0.0–0.2)

## 2022-12-09 LAB — I-STAT BETA HCG BLOOD, ED (MC, WL, AP ONLY): I-stat hCG, quantitative: <5 m[IU]/mL (ref <5)

## 2022-12-09 LAB — TROPONIN I (HIGH SENSITIVITY): Troponin I (High Sensitivity): <2 ng/L (ref <18)

## 2022-12-09 NOTE — Inpatient Diabetes Management (Signed)
Inpatient Diabetes Program Recommendations  AACE/ADA: New Consensus Statement on Inpatient Glycemic Control (2015)  Target Ranges:  Prepandial:   less than 140 mg/dL      Peak postprandial:   less than 180 mg/dL (1-2 hours)      Critically ill patients:  140 - 180 mg/dL   Lab Results  Component Value Date   GLUCAP 139 (H) 10/12/2021   HGBA1C 9.7 (A) 11/16/2022    Review of Glycemic Control  Latest Reference Range & Units 12/09/22 08:06  Glucose 70 - 99 mg/dL 235 (H)   Diabetes history: DM type 1 since age 27 Outpatient Diabetes medications: Semglee 40 units qam, 20 units qpm, Lyumjev 20-25 units lunch dinner, 30 units before breakfast, target glucose 100, ISF 20, Dexcom G6 Current orders for Inpatient glycemic control:  None in ED waiting area  Endocrinologist: Dr. Cruzita Lederer Last visit 11/16/22 A1c 9.7% on 12/12  Inpatient Diabetes Program Recommendations:    NOTE: pt has Type 1 Diabetes, consider the following while being evaluated in the ED  -   Novolog 0-15 units Q4 hours -   Semglee 30 units qam, 20 units qpm  Thanks,  Tama Headings RN, MSN, BC-ADM Inpatient Diabetes Coordinator Team Pager [phone] (8a-5p)

## 2022-12-09 NOTE — ED Notes (Signed)
Patient left without being seen.

## 2022-12-09 NOTE — ED Provider Triage Note (Signed)
Emergency Medicine Provider Triage Evaluation Note  Veronica Liu , a 27 y.o. female  was evaluated in triage.  Pt complains of central chest pain since midnight associated with dizziness, lightheadedness, shortness of breath.  History of POTS as well as diabetes type 1.  Similar episode in the past with her POTS but never this severe.  Review of Systems  Positive: Chest pain, shortness of breath, dizziness Negative: Cough, fever, nausea or vomiting  Physical Exam  BP 120/86   Pulse (!) 107   Temp 98.9 F (37.2 C) (Oral)   Resp 16   Ht 5\' 7"  (1.702 m)   Wt 93 kg   LMP 11/23/2022   SpO2 97%   BMI 32.11 kg/m  Gen:   Awake, no distress   Resp:  Normal effort  MSK:   Moves extremities without difficulty  Other:   Medical Decision Making  Medically screening exam initiated at 8:10 AM.  Appropriate orders placed.  JULANE CROCK was informed that the remainder of the evaluation will be completed by another provider, this initial triage assessment does not replace that evaluation, and the importance of remaining in the ED until their evaluation is complete.     Ezequiel Essex, MD 12/09/22 [phone]

## 2022-12-09 NOTE — ED Triage Notes (Addendum)
EMS stated, she is complaining of chest pain, dizziness , and hypertension. She was just dx with POTTS. This started around midnight.  Given ASA 324mg 

## 2022-12-09 NOTE — Telephone Encounter (Signed)
Pt called to request a rx for a tandem pump be sent in. Order placed through [employer] online portal. Pt needs supplies to schedule education appt.

## 2022-12-18 ENCOUNTER — Other Ambulatory Visit: Payer: Self-pay | Admitting: Internal Medicine

## 2023-03-03 ENCOUNTER — Ambulatory Visit: Payer: BC Managed Care – PPO | Admitting: Internal Medicine

## 2023-04-12 ENCOUNTER — Ambulatory Visit: Payer: BC Managed Care – PPO | Admitting: Internal Medicine

## 2023-05-16 ENCOUNTER — Encounter: Payer: Self-pay | Admitting: Internal Medicine

## 2023-05-16 ENCOUNTER — Ambulatory Visit: Payer: BC Managed Care – PPO | Attending: Internal Medicine | Admitting: Internal Medicine

## 2023-05-16 VITALS — BP 140/104 | HR 133 | Ht 67.0 in | Wt 219.4 lb

## 2023-05-16 DIAGNOSIS — I10 Essential (primary) hypertension: Secondary | ICD-10-CM

## 2023-05-16 DIAGNOSIS — R000 Tachycardia, unspecified: Secondary | ICD-10-CM

## 2023-05-16 DIAGNOSIS — G90A Postural orthostatic tachycardia syndrome (POTS): Secondary | ICD-10-CM | POA: Diagnosis not present

## 2023-05-16 DIAGNOSIS — A1801 Tuberculosis of spine: Secondary | ICD-10-CM

## 2023-05-16 NOTE — Progress Notes (Addendum)
Patient Care Team: Sunnie Nielsen, DO as PCP - General (Osteopathic Medicine)   HPI  Veronica Liu is a 27 y.o. female  (goes by [facility])  Seen in follow-up with a history of hypertension and intermittent hypokalemia borderline personality/bipolar orthostatic hypertension and tachy palpitations in the context of progressive obesity and longstanding diabetes.  Have tried low-dose beta-blockers with only modest success.  Biggest complaints are headaches which she relates to elevated blood pressure, tachy palpitations and exercise intolerance.  Sometimes she does not have enough energy to get out of bed.  Extremely nervous about medications.  Previously on mood stabilizers which she discontinued a couple of years ago.  Was not sure that they worked about this time she has developed the cardiovascular symptoms.  DATE TEST EF   12/22 Echo   65-70 %                Date Cr K Hgb  1/24 0.63 3.9 16.1           Records and Results Reviewed   Past Medical History:  Diagnosis Date   Anxiety    Depression    Diabetes mellitus type I ([facility])    onset age 66   Diabetic gastroparesis ([facility])    GERD (gastroesophageal reflux disease)    Pott's disease     Past Surgical History:  Procedure Laterality Date   NO PAST SURGERIES      Current Meds  Medication Sig   acetone, urine, test strip Check ketones per protocol   BAYER MICROLET LANCETS lancets Check sugars 6 times daily and per protocol for hyper and hypoglycemia   BD PEN NEEDLE NANO 2ND GEN 32G X 4 MM MISC USE TO INJECT INSULIN VIA INSULIN PEN FIVE TIMES DAILY   Blood Glucose Monitoring Suppl (ONETOUCH VERIO) w/Device KIT Use to check blood sugar 4 times a day   Continuous Blood Gluc Receiver (DEXCOM G6 RECEIVER) DEVI For continuous blood glucose monitoring 4 times a day; E10.42   Continuous Blood Gluc Sensor (DEXCOM G6 SENSOR) MISC USE FOR CONTINUOUS BLOOD GLUCOSE MONITORING FOUR TIMES A DAY   Continuous Blood Gluc  Transmit (DEXCOM G6 TRANSMITTER) MISC USE FOR CONTINUOUS BLOOD GLUCOSE MONITORING FOUR TIMES A DAY   Glucagon 3 MG/DOSE POWD Place 3 mg into the nose once as needed for up to 1 dose.   glucose blood (ONETOUCH VERIO) test strip Checks blood sugar 4 times daily   ibuprofen (ADVIL) 200 MG tablet Take 200 mg by mouth every 6 (six) hours as needed for headache or mild pain.   insulin glargine-yfgn (SEMGLEE, YFGN,) 100 UNIT/ML Pen INJECT 40 UNITS UNDER THE SKIN DAILY   Insulin Lispro-aabc (LYUMJEV KWIKPEN) 100 UNIT/ML KwikPen INJECT UNDER THE SKIN 4 TO 15 UNITS BEFORE MEALS THREE TIMES A DAY   metoprolol tartrate (LOPRESSOR) 25 MG tablet Take 0.5 tablets (12.5 mg total) by mouth 2 (two) times daily.    Allergies  Allergen Reactions   Levemir [Insulin Detemir]     Hives at injection sites   Augmentin [Amoxicillin-Pot Clavulanate]     Vomiting as young child   Omnicef [Cefdinir]     Vomiting as young child.      Review of Systems negative except from HPI and PMH  Physical Exam BP (!) 140/104 (Patient Position: Standing)   Pulse (!) 133   Ht 5\' 7"  (1.702 m)   Wt 219 lb 6.4 oz (99.5 kg)   LMP 04/25/2023   SpO2  99%   BMI 34.36 kg/m  Well developed and well nourished in no acute distress Morbidly obese  HENT normal E scleral and icterus clear Neck Supple JVP flat; carotids brisk and full Clear to auscultation Rapid Regular rate and rhythm, no murmurs gallops or rub Soft with active bowel sounds No clubbing cyanosis  Edema Alert and oriented, grossly normal motor and sensory function Skin Warm and Dry  ECG    CrCl cannot be calculated (Patient's most recent lab result is older than the maximum 21 days allowed.).   Assessment and  Plan   POTS/sinus tachycardia ( ? 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  Spectrum  Diabetes Mellitus Type 1  (life  long)   Polycythemia   Number of issues.  She has tachycardia, multiple TSHs have been normal.  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  This is being followed by endocrinology.  We have discussed 2 different approaches, the initial will be to try and use beta-blockers to get what benefit we can, we will discontinue metoprolol and given prescriptions to bisoprolol 5, carvedilol 6.25, and nebivolol 5.  She will start each at half dose and see if she can tolerate any of them and then we will uptitrate as we can to maximally tolerated doses to get what benefit we can from this group of agents.  If this is insufficient or not tolerated, we will choose a different approach would be to use antihypertensives and to begin with ivabradine, she would need to get this from [place].  Antihypertensives could include ACE inhibitors amlodipine etc.  She might well need combination therapies in the case.  She does note that she is anxious about the impacts of medications on her body.  We discussed the importance of exercise.  I have recommended a CUBII as well as water aerobics.  She will be looking into the cost of this pool access and let us know what this is. Again encouraged her to follow-up with gynecology for did consider diagnosis of PCOS, there is a family history of this.  With her hypertension and her history of hypokalemia we will check a renin and Aldo level.  Strongly encouragedconsidering repeat visit with Psych , no longer on mood stabliizers and thinks they did not help anyway  Has been suicidal and with a plan, currently not, strongly encouraged her to reach out for assistance if there is recurrence.    Will recheck CBC with elevated Hgb     Current medicines are reviewed at length with the patient today .  The patient does not  have concerns regarding medicines.

## 2023-05-16 NOTE — Patient Instructions (Addendum)
Medication Instructions:  Your physician has recommended you make the following change in your medication:   ** Stop Metoprolol  **You are being given three prescriptions for different beta blockers to try.  You may take these in any order. DO NOT TAKE MORE THAN ONE BETA BLOCKER AT A TIME. Choose one beta blocker to start with. If after two weeks, you feel the medication is working well for you, continue that current medication. If it does not work well for you, try another prescription for a beta blocker at that time. You may continue that current beta blocker at that time if it works well for you. If it does not, repeat these instructions for the third beta blocker.  When you find a beta blocker that works well for you, call the office and we will send in a prescription for you.     *If you need a refill on your cardiac medications before your next appointment, please call your pharmacy*   Lab Work:  Renin-Aldosterone Level today  If you have labs (blood work) drawn today and your tests are completely normal, you will receive your results only by: MyChart Message (if you have MyChart) OR A paper copy in the mail If you have any lab test that is abnormal or we need to change your treatment, we will call you to review the results.   Testing/Procedures: None ordered.    Follow-Up: At [facility], you and your health needs are our priority.  As part of our continuing mission to provide you with exceptional heart care, we have created designated Provider Care Teams.  These Care Teams include your primary Cardiologist (physician) and Advanced Practice Providers (APPs -  Physician Assistants and Nurse Practitioners) who all work together to provide you with the care you need, when you need it.  We recommend signing up for the patient portal called "MyChart".  Sign up information is provided on this After Visit Summary.  MyChart is used to connect with patients for Virtual  Visits (Telemedicine).  Patients are able to view lab/test results, encounter notes, upcoming appointments, etc.  Non-urgent messages can be sent to your provider as well.   To learn more about what you can do with MyChart, go to [email or web address].    Your next appointment:   As scheduled  Handicap Parking Application provided

## 2023-05-20 LAB — ALDOSTERONE + RENIN ACTIVITY W/ RATIO
Aldos/Renin Ratio: <1.5 (ref 0.0–30.0)
Aldosterone: <1 ng/dL (ref 0.0–30.0)
Renin Activity, Plasma: 0.675 ng/mL/hr (ref 0.167–5.380)

## 2023-05-23 ENCOUNTER — Encounter (HOSPITAL_BASED_OUTPATIENT_CLINIC_OR_DEPARTMENT_OTHER): Payer: Self-pay | Admitting: Emergency Medicine

## 2023-05-23 ENCOUNTER — Other Ambulatory Visit: Payer: Self-pay

## 2023-05-23 ENCOUNTER — Emergency Department (HOSPITAL_BASED_OUTPATIENT_CLINIC_OR_DEPARTMENT_OTHER)
Admission: EM | Admit: 2023-05-23 | Discharge: 2023-05-23 | Disposition: A | Discharge status: Home / Self Care | Payer: BC Managed Care – PPO | Attending: Emergency Medicine | Admitting: Emergency Medicine

## 2023-05-23 DIAGNOSIS — E109 Type 1 diabetes mellitus without complications: Secondary | ICD-10-CM | POA: Insufficient documentation

## 2023-05-23 DIAGNOSIS — H5712 Ocular pain, left eye: Secondary | ICD-10-CM | POA: Diagnosis not present

## 2023-05-23 MED ORDER — LYUMJEV KWIKPEN 100 UNIT/ML ~~LOC~~ SOPN: PEN_INJECTOR | SUBCUTANEOUS | 0 refills | Status: DC

## 2023-05-23 MED ORDER — OXYCODONE-ACETAMINOPHEN 5-325 MG PO TABS: 1.0000 | ORAL_TABLET | Freq: Four times a day (QID) | ORAL | 0 refills | Status: AC | PRN

## 2023-05-23 MED ORDER — TETRACAINE HCL 0.5 % OP SOLN
2.0000 [drp] | Freq: Once | OPHTHALMIC | Status: AC
  Administered 2023-05-23: 2 [drp] via OPHTHALMIC
  Filled 2023-05-23: qty 4

## 2023-05-23 MED ORDER — ERYTHROMYCIN 5 MG/GM OP OINT: TOPICAL_OINTMENT | OPHTHALMIC | 0 refills | Status: AC

## 2023-05-23 MED ORDER — FLUORESCEIN SODIUM 1 MG OP STRP
1.0000 | ORAL_STRIP | Freq: Once | OPHTHALMIC | Status: AC
  Administered 2023-05-23: 1 via OPHTHALMIC
  Filled 2023-05-23: qty 1

## 2023-05-23 MED ORDER — SENNOSIDES-DOCUSATE SODIUM 8.6-50 MG PO TABS: 1.0000 | ORAL_TABLET | Freq: Every evening | ORAL | 0 refills | Status: AC | PRN

## 2023-05-23 NOTE — ED Provider Notes (Signed)
Emergency Department Provider Note   I have reviewed the triage vital signs and the nursing notes.   HISTORY  Chief Complaint Eye Pain   HPI Veronica Liu is a 27 y.o. female past history reviewed below presents emergency department left eye pain.  Patient states that she had something in her eye yesterday, likely an eyelash.  She was trying to remove this with her finger and thinks she may have scratched the surface of her eye.  She has photophobia with moderate to severe pain.  No significant vision change.  No severe headache.  She does not wear contact lenses.   Past Medical History:  Diagnosis Date   Anxiety    Depression    Diabetes mellitus type I (HCC)    onset age 70   Diabetic gastroparesis (HCC)    GERD (gastroesophageal reflux disease)    POTS (postural orthostatic tachycardia syndrome)    Pott's disease     Review of Systems  Constitutional: No fever/chills Eyes: No visual changes. Positive left eye pain.  Cardiovascular: Denies chest pain. Respiratory: Denies shortness of breath. Gastrointestinal: No abdominal pain.   Musculoskeletal: Negative for back pain. Skin: Negative for rash. Neurological: Negative for headaches, focal weakness or numbness.  ____________________________________________   PHYSICAL EXAM:  VITAL SIGNS: ED Triage Vitals  Enc Vitals Group     BP 05/23/23 1028 (!) 147/99     Pulse Rate 05/23/23 1028 (!) 132     Resp 05/23/23 1028 18     Temp 05/23/23 1028 98.2 F (36.8 C)     Temp Source 05/23/23 1028 Temporal     SpO2 05/23/23 1028 100 %   Constitutional: Alert and oriented. Well appearing and in no acute distress. Eyes: Conjunctivae are normal. PERRL. EOMI. IOP: 19 (R) and 14 (L).  Patient with central corneal abrasion noted on the left with fluorescein stain.  Head: Atraumatic. Nose: No congestion/rhinnorhea. Mouth/Throat: Mucous membranes are moist.  Neck: No stridor.   Cardiovascular: Good peripheral  circulation. Respiratory: Normal respiratory effort.   Gastrointestinal: No distention.  Musculoskeletal: No gross deformities of extremities. Neurologic:  Normal speech and language.  Skin:  Skin is warm, dry and intact. No rash noted. ____________________________________________   PROCEDURES  Procedure(s) performed:   Procedures  None  ____________________________________________   INITIAL IMPRESSION / ASSESSMENT AND PLAN / ED COURSE  Pertinent labs & imaging results that were available during my care of the patient were reviewed by me and considered in my medical decision making (see chart for details).   This patient is Presenting for Evaluation of eye pain, which does require a range of treatment options, and is a complaint that involves a high risk of morbidity and mortality.  The Differential Diagnoses includes corneal abrasion, acute angle glaucoma, globe injury, orbital cellulitis, etc.  Critical Interventions-    Medications  fluorescein ophthalmic strip 1 strip (1 strip Both Eyes Given by Other 05/23/23 1114)  tetracaine (PONTOCAINE) 0.5 % ophthalmic solution 2 drop (2 drops Both Eyes Given by Other 05/23/23 1114)    Reassessment after intervention: pain resolved with tetracaine.   Medical Decision Making: Summary:  Patient presents emergency department with left eye pain.  Exam consistent with corneal abrasion.  Eye pressures normal.  Pain immediately resolved with tetracaine.  Plan for erythromycin ointment.  Patient does not wear contact lenses.  Patient's presentation is most consistent with acute, uncomplicated illness.   Disposition: discharge  ____________________________________________  FINAL CLINICAL IMPRESSION(S) / ED DIAGNOSES  Final diagnoses:  Left eye pain    NEW OUTPATIENT MEDICATIONS STARTED DURING THIS VISIT:  Discharge Medication List as of 05/23/2023 11:36 AM     START taking these medications   Details  erythromycin ophthalmic  ointment Place a 1/2 inch ribbon of ointment into the left lower eyelid 4 times daily for 7 days., Normal    oxyCODONE-acetaminophen (PERCOCET/ROXICET) 5-325 MG tablet Take 1 tablet by mouth every 6 (six) hours as needed for severe pain., Starting Mon 05/23/2023, Normal    senna-docusate (SENOKOT-S) 8.6-50 MG tablet Take 1 tablet by mouth at bedtime as needed for mild constipation., Starting Mon 05/23/2023, Normal        Note:  This document was prepared using Dragon voice recognition software and may include unintentional dictation errors.  Alona Bene, MD, [facility]    Francy Mcilvaine, Arlyss Repress, MD 05/27/23 [phone]

## 2023-05-23 NOTE — Discharge Instructions (Signed)
Please take the eye ointment as prescribed and follow-up with the eye doctor listed.  You will need to call the office to schedule a follow-up appointment.  Return with any new or suddenly worsening symptoms.

## 2023-05-23 NOTE — ED Triage Notes (Signed)
Pt arrives to ED with c/o left eye pain. Pt notes she had something in her eye yesterday and attempted to get it out and she scratched her eyeball.

## 2023-06-04 ENCOUNTER — Other Ambulatory Visit: Payer: Self-pay | Admitting: Internal Medicine

## 2023-06-04 DIAGNOSIS — E1065 Type 1 diabetes mellitus with hyperglycemia: Secondary | ICD-10-CM

## 2023-06-29 ENCOUNTER — Other Ambulatory Visit: Payer: Self-pay | Admitting: Internal Medicine

## 2023-07-01 DIAGNOSIS — G90A Postural orthostatic tachycardia syndrome (POTS): Secondary | ICD-10-CM | POA: Insufficient documentation

## 2023-07-07 ENCOUNTER — Encounter: Payer: Self-pay | Admitting: Internal Medicine

## 2023-07-07 ENCOUNTER — Ambulatory Visit: Payer: BC Managed Care – PPO | Attending: Cardiology | Admitting: Internal Medicine

## 2023-07-07 VITALS — BP 124/95 | HR 110 | Ht 67.0 in | Wt 210.0 lb

## 2023-07-07 DIAGNOSIS — G90A Postural orthostatic tachycardia syndrome (POTS): Secondary | ICD-10-CM

## 2023-07-07 DIAGNOSIS — D751 Secondary polycythemia: Secondary | ICD-10-CM

## 2023-07-07 MED ORDER — CARVEDILOL 6.25 MG PO TABS: 9.3750 mg | ORAL_TABLET | Freq: Two times a day (BID) | ORAL | 1 refills | Status: DC

## 2023-07-07 MED ORDER — CARVEDILOL 6.25 MG PO TABS: 9.3750 mg | ORAL_TABLET | Freq: Two times a day (BID) | ORAL | 0 refills | Status: DC

## 2023-07-07 NOTE — Patient Instructions (Signed)
Medication Instructions:  Your physician has recommended you make the following change in your medication:   Increase Carvedilol 6.25 to 1-1/2 tablets by mouth at bedtime dose x 2 weeks then increase Carvedilol to 1-1/2 tablets by mouth twice daily  *If you need a refill on your cardiac medications before your next appointment, please call your pharmacy*   Lab Work: CBC and Erythropoietin level - you may go to [facility] to have these drawn  If you have labs (blood work) drawn today and your tests are completely normal, you will receive your results only by: MyChart Message (if you have MyChart) OR A paper copy in the mail If you have any lab test that is abnormal or we need to change your treatment, we will call you to review the results.   Testing/Procedures: None ordered.    Follow-Up: At [facility], you and your health needs are our priority.  As part of our continuing mission to provide you with exceptional heart care, we have created designated Provider Care Teams.  These Care Teams include your primary Cardiologist (physician) and Advanced Practice Providers (APPs -  Physician Assistants and Nurse Practitioners) who all work together to provide you with the care you need, when you need it.  We recommend signing up for the patient portal called "MyChart".  Sign up information is provided on this After Visit Summary.  MyChart is used to connect with patients for Virtual Visits (Telemedicine).  Patients are able to view lab/test results, encounter notes, upcoming appointments, etc.  Non-urgent messages can be sent to your provider as well.   To learn more about what you can do with MyChart, go to [email or web address].    Your next appointment:   3-4 months with Dr Graciela Husbands - his scheduler will contact you with appointment

## 2023-07-07 NOTE — Progress Notes (Signed)
Electrophysiology TeleHealth Note      Date:  07/07/2023   ID:  Veronica Liu, DOB June 01, 1996, MRN [id number]  Location: patient's home  Provider location: [address], [place]  Evaluation Performed: Follow-up visit  PCP:  Sunnie Nielsen, DO  Cardiologist:    Electrophysiologist:  SK   Chief Complaint:    History of Present Illness:    Veronica Liu (goes by Veronica Liu)is a 27 y.o. female who presents via audio conferencing for a telehealth visit today.  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 with efforts at the last visit to try different beta-blockers the patient reports mental status is better And less fatigue Ambulating not at all GYN appt for PCOS/menses    Renin aldo>>normal  Date Cr K Hgb  1/24 0.63 3.9 16.1             Past Medical History:  Diagnosis Date   Anxiety    Depression    Diabetes mellitus type I (HCC)    onset age 79   Diabetic gastroparesis (HCC)    GERD (gastroesophageal reflux disease)    POTS (postural orthostatic tachycardia syndrome)    Pott's disease     Past Surgical History:  Procedure Laterality Date   NO PAST SURGERIES      Current Outpatient Medications  Medication Sig Dispense Refill   acetone, urine, test strip Check ketones per protocol 50 each 3   BAYER MICROLET LANCETS lancets Check sugars 6 times daily and per protocol for hyper and hypoglycemia 250 each 3   BD PEN NEEDLE NANO 2ND GEN 32G X 4 MM MISC USE TO INJECT INSULIN VIA INSULIN PEN FIVE TIMES DAILY 450 each 3   Blood Glucose Monitoring Suppl (ONETOUCH VERIO) w/Device KIT Use to check blood sugar 4 times a day     carvedilol (COREG) 6.25 MG tablet TAKE 1 TABLET BY MOUTH DAILY 30 tablet 0   Continuous Blood Gluc Receiver (DEXCOM G6 RECEIVER) DEVI For continuous blood  glucose monitoring 4 times a day; E10.42 1 each 0   Continuous Blood Gluc Sensor (DEXCOM G6 SENSOR) MISC USE FOR CONTINUOUS BLOOD GLUCOSE MONITORING FOUR TIMES A DAY 9 each 3   Continuous Glucose Transmitter (DEXCOM G6 TRANSMITTER) MISC USE FOR CONTINUOUS BLOOD GLUCOSE MONITORING FOUR TIMES A DAY 1 each 3   erythromycin ophthalmic ointment Place a 1/2 inch ribbon of ointment into the left lower eyelid 4 times daily for 7 days. 3.5 g 0   Glucagon 3 MG/DOSE POWD Place 3 mg into the nose once as needed for up to 1 dose. 1 each 11   glucose blood (ONETOUCH VERIO) test strip Checks blood sugar 4 times daily 400 each 11   ibuprofen (ADVIL) 200 MG tablet Take 200 mg by mouth every 6 (six) hours as needed for headache or mild pain.     insulin glargine-yfgn (SEMGLEE, YFGN,) 100 UNIT/ML Pen INJECT 40 UNITS UNDER THE SKIN DAILY 30 mL 3   Insulin Lispro-aabc (LYUMJEV KWIKPEN) 100 UNIT/ML KwikPen INJECT UNDER THE SKIN 4 TO 15 UNITS BEFORE MEALS THREE TIMES A DAY 45 mL 0   oxyCODONE-acetaminophen (PERCOCET/ROXICET) 5-325 MG tablet Take 1 tablet by mouth every 6 (six) hours as needed for severe pain. 10 tablet 0   senna-docusate (SENOKOT-S) 8.6-50 MG tablet Take 1 tablet by mouth at bedtime as needed for mild constipation.  20 tablet 0   No current facility-administered medications for this visit.    Allergies:   Levemir [insulin detemir], Augmentin [amoxicillin-pot clavulanate], and Omnicef [cefdinir]   ROS:  Please see the history of present illness.   All other systems are personally reviewed and negative.    Exam:    Vital Signs:  BP (!) 124/95   Pulse (!) 110   Ht 5\' 7"  (1.702 m)   Wt 210 lb (95.3 kg)   BMI 32.89 kg/m         Labs/Other Tests and Data Reviewed:    Recent Labs: 12/09/2022: BUN 8; Creatinine, Ser 0.63; Hemoglobin 16.1; Platelets 315; Potassium 3.9; Sodium 135   Wt Readings from Last 3 Encounters:  07/07/23 210 lb (95.3 kg)  05/16/23 219 lb 6.4 oz (99.5 kg)  12/09/22 205 lb  (93 kg)     Other studies personally reviewed: Additional studies/ records that were reviewed today include:      ASSESSMENT & PLAN:     POTS/sinus tachycardia ( ? 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  Spectrum   Diabetes Mellitus Type 1  (life long)    Polycythemia  Hyperadrenergic POTS like syndrome.  Will continue beta-blockers, continue to consider clonidine but for right now we will continue with the former.  She is doing pretty well on the carvedilol and will increase it as noted below.  Blood pressures are better.  Heart rates are also somewhat better.  She has an appointment next week with gynecology to look at menses suppression as well as the question of PCOS.  She remains polycythemic.  We will recheck her hemoglobin and erythropoietin level looking to see whether there seems to be an underlying primary hematological issue with her weight, she may well have hypoventilation and possibly even sleep apnea.      Follow-up:  4-5 months    Current medicines are reviewed at length with the patient today.   The patient  concerns regarding her medicines.  The following changes were made today:  increase carvedilol 9.375 at bedtime, after two weeks increase to 9.375 bid..  Labs/ tests ordered today include: erythropoieten /CBC   No orders of the defined types were placed in this encounter.     Today, I have spent 22 minutes with the patient with telehealth technology discussing the above.  Signed, Sherryl Manges, MD  07/07/2023 3:31 PM     [facility] [address] [place] [address] [phone] (office) [phone] (fax)

## 2023-07-11 DIAGNOSIS — G90A Postural orthostatic tachycardia syndrome (POTS): Secondary | ICD-10-CM | POA: Diagnosis not present

## 2023-07-11 DIAGNOSIS — D751 Secondary polycythemia: Secondary | ICD-10-CM | POA: Diagnosis not present

## 2023-07-14 ENCOUNTER — Other Ambulatory Visit: Payer: Self-pay | Admitting: Internal Medicine

## 2023-07-22 DIAGNOSIS — Z800 Family history of malignant neoplasm of digestive organs: Secondary | ICD-10-CM | POA: Diagnosis not present

## 2023-07-22 DIAGNOSIS — L679 Hair color and hair shaft abnormality, unspecified: Secondary | ICD-10-CM | POA: Diagnosis not present

## 2023-07-22 DIAGNOSIS — E282 Polycystic ovarian syndrome: Secondary | ICD-10-CM | POA: Diagnosis not present

## 2023-07-28 ENCOUNTER — Other Ambulatory Visit: Payer: Self-pay | Admitting: Internal Medicine

## 2023-08-19 ENCOUNTER — Other Ambulatory Visit: Payer: Self-pay

## 2023-08-19 MED ORDER — LYUMJEV KWIKPEN 100 UNIT/ML ~~LOC~~ SOPN: PEN_INJECTOR | SUBCUTANEOUS | 0 refills | Status: DC

## 2023-08-19 NOTE — Telephone Encounter (Signed)
Patient called and left a vm stating she needed a refill on her insulin and the last rx was not up to date per her last visit. New script has been sent to her local pharmacy   Requested Prescriptions   Signed Prescriptions Disp Refills   Insulin Lispro-aabc (LYUMJEV KWIKPEN) 100 UNIT/ML KwikPen 90 mL 0    Sig: INJECT UNDER THE SKIN 23-30 UNITS BEFORE MEALS THREE TIMES A DAY    Authorizing Provider: Carlus Pavlov    Ordering User: Pollie Meyer

## 2023-08-29 ENCOUNTER — Telehealth: Payer: Self-pay

## 2023-08-29 ENCOUNTER — Encounter: Payer: Self-pay | Admitting: Internal Medicine

## 2023-08-29 ENCOUNTER — Ambulatory Visit (INDEPENDENT_AMBULATORY_CARE_PROVIDER_SITE_OTHER): Payer: BC Managed Care – PPO | Admitting: Internal Medicine

## 2023-08-29 VITALS — BP 118/70 | HR 118 | Ht 67.0 in | Wt 213.4 lb

## 2023-08-29 DIAGNOSIS — E1065 Type 1 diabetes mellitus with hyperglycemia: Secondary | ICD-10-CM

## 2023-08-29 DIAGNOSIS — E663 Overweight: Secondary | ICD-10-CM

## 2023-08-29 DIAGNOSIS — Z794 Long term (current) use of insulin: Secondary | ICD-10-CM

## 2023-08-29 LAB — POCT GLYCOSYLATED HEMOGLOBIN (HGB A1C): Hemoglobin A1C: 10.2 % — AB (ref 4.0–5.6)

## 2023-08-29 MED ORDER — "BD SYRINGE SLIP TIP 26G X 5/8"" 1 ML MISC": 3 refills | Status: AC

## 2023-08-29 MED ORDER — LYUMJEV KWIKPEN 200 UNIT/ML ~~LOC~~ SOPN: PEN_INJECTOR | SUBCUTANEOUS | 11 refills | Status: DC

## 2023-08-29 NOTE — Telephone Encounter (Signed)
Samples of this drug (novolog) were given to the patient, quantity 1 box, Lot Number [id number], exp: 12/05/2024

## 2023-08-29 NOTE — Patient Instructions (Addendum)
Please increase: - Semglee 50-55 units daily  Use: - Lyumjev U200/NovoLog: 22-26 units before meals - Lyumjev sliding scale: Target 100 ISF 20    Try to change injection sites as discussed.  Let us know when you get M'aid.  Please schedule an appt with Cristy Folks for diabetes education - [phone].  Please stop at the lab.  Please return in 1.5-2 months.

## 2023-08-29 NOTE — Progress Notes (Unsigned)
Patient ID: Veronica Liu, female   DOB: [birth year], 27 y.o.   MRN: [id number]  HPI: Veronica Liu is a 27 y.o.-year-old female, returning for follow-up for DM1, dx'ed at 27 y/o as DM2, and DM1 at 27 y/o, on insulin since 27 y/o uncontrolled, with long-term complications (PN, gastoparesis).  She was previously followed by Dr. Fransico Michael and then Rhys Martini with pediatric endocrinology at [facility].  Last visit with 9 months ago.  Interim history: She has some increased urination but no nausea or blurry vision. She has a reverse circadian rhythm, staying up during the night and waking up around 3-4 PM.  Prev. Skipping insulin doses b/c of this.  She did not take the insulin more consistently, but combined the 2 doses in one not to forget any doses.  Reviewed HbA1c levels: Lab Results  Component Value Date   HGBA1C 9.7 (A) 11/16/2022   HGBA1C 9.2 (A) 03/19/2022   HGBA1C 8.6 (H) 10/12/2021   HGBA1C 8.0 (A) 07/28/2021   HGBA1C 8.6 (A) 03/17/2021   HGBA1C 8.0 (A) 12/16/2020   HGBA1C 8.1 (A) 09/15/2020   HGBA1C 9.0 (A) 04/01/2020   HGBA1C 9.1 (A) 10/09/2019   HGBA1C 9.0 (A) 06/07/2019   HGBA1C 9.1 (A) 02/01/2019   HGBA1C 9.2 (A) 05/19/2018   HGBA1C 8.7 08/05/2014   HGBA1C 9.9 03/05/2014   HGBA1C 8.0 09/06/2013   HGBA1C 10.8 06/19/2013   HGBA1C 12.5 (H) 06/04/2013   HGBA1C 14.8 (H) 01/17/2013  02/27/2018: HbA1c 9.4% 11/11/2017: HbA1c 8.8% 08/16/2017: HbA1c 8.9% 04/18/2017: HbA1c 9.3% 06/23/2016: HbA1c 12%  Previously on: -Lantus 40 >> 34 units at bedtime >> 17 >> 25 units 2x a day >> Tresiba 40 units daily (not covered) >> Semglee 25 units 2x a day -Humalog >> Lyumjev: ICR  (not actually using an ICR) - 12-14 units -before her dinner and 6-8 units before the other meals Target 100 ISF 25 >> 20 We tried Metformin 1000 mg with dinner-added 06/2019 >> stopped due to diarrhea.  Currently on: - Semglee             35 >> 40 in am and 20 units in the evening >> now 45 units at night -  Lyumjev: 20-25 units before meals >> 20-26 units before meals - Lyumjev sliding scale: Target 100 ISF 20   She checks her sugars more than 4 times a day with her Dexcom CGM:  Prev.:  Prev.:   Lowest sugar was 60 >> 70 >> 50s x1 in the middle of the night >> 70 >> 65.  No previous hypoglycemia admissions.  She does have a glucagon kit at home. Highest sugar was 300 >> >400 >> HI >> HI >> HI  She had an ED visit for DKA on 10/11/2021.  She also has a [employer].  Pt's meals are: - Breakfast: skips b/c gastroparesis (nausea) - prev. Reglan, now phenergan - Lunch: sandwich + chips - Dinner: meat + veggies + bread - Snacks: cheese sticks, carrots  No CKD: Lab Results  Component Value Date   BUN 8 12/09/2022   BUN 10 06/22/2022   CREATININE 0.63 12/09/2022   CREATININE 0.71 06/22/2022   Lab Results  Component Value Date   MICRALBCREAT 1.1 11/10/2020   MICRALBCREAT 4 06/07/2019   MICRALBCREAT 10.7 03/05/2014   MICRALBCREAT 20.0 06/03/2013   + HL: Lab Results  Component Value Date   CHOL 239 (H) 11/10/2020   HDL 66.40 11/10/2020   LDLCALC 155 (H) 11/10/2020  TRIG 89.0 11/10/2020   CHOLHDL 4 11/10/2020  She is not on a statin.    - last eye exam was in 05/2020: No DR reportedly.  -+ Occasional numbness and tingling in her feet. Last foot exam 03/19/2022.  Latest TSH was normal: Lab Results  Component Value Date   TSH 1.290 06/22/2022   Pt has FH of DM2 in father and PGM.  Cousin with DM1.  She has a history of depression.  In fall of 2022, she had a panic attack, after which she started to feel very dizzy and feel very poorly.  Her blood pressure started to fluctuate significantly and she also started to have nausea, abdominal pain, anxiety, palpitations, shortness of breath, chest pain, blurry vision, daily migraines, constipation, general muscle aches.  She was investigated by cardiology and had a Holter monitor and also had a 2D echo.  Heart  investigation was negative.  She was dx'ed with POTS - sees Dr. Graciela Husbands. She tried metoprolol but blood pressure got too low while lying down.  She was smoking half a pack a day >> quit in 10/2021.  ROS: + see HPI  I reviewed pt's medications, allergies, PMH, social hx, family hx, and changes were documented in the history of present illness. Otherwise, unchanged from my initial visit note.  Past Medical History:  Diagnosis Date   Anxiety    Depression    Diabetes mellitus type I (HCC)    onset age 34   Diabetic gastroparesis (HCC)    GERD (gastroesophageal reflux disease)    POTS (postural orthostatic tachycardia syndrome)    Pott's disease    Past Surgical History:  Procedure Laterality Date   NO PAST SURGERIES     Social History   Socioeconomic History   Marital status: Single    Spouse name: Not on file   Number of children: 0  Occupational History   N/a  Tobacco Use   Smoking status: Current Every Day Smoker    Packs/day: 0.50    Years: 5.00    Pack years: 2.50    Types: Cigarettes   Smokeless tobacco: Never Used   Tobacco comment: NA  Substance and Sexual Activity   Alcohol use: Never    Frequency: Never   Drug use: Never   Sexual activity: Yes    Birth control/protection: None, Condom  Social History Narrative   Lives with mom, step-dad, brother, and sister. Sees bio-dad twice monthly.    Current Outpatient Medications on File Prior to Visit  Medication Sig Dispense Refill   acetone, urine, test strip Check ketones per protocol 50 each 3   BAYER MICROLET LANCETS lancets Check sugars 6 times daily and per protocol for hyper and hypoglycemia 250 each 3   BD PEN NEEDLE NANO 2ND GEN 32G X 4 MM MISC USE TO INJECT INSULIN VIA INSULIN PEN FIVE TIMES DAILY 450 each 3   Blood Glucose Monitoring Suppl (ONETOUCH VERIO) w/Device KIT Use to check blood sugar 4 times a day     carvedilol (COREG) 6.25 MG tablet Take 1 tablet (6.25 mg total) by mouth 2 (two) times daily  with a meal. 180 tablet 3   Continuous Blood Gluc Receiver (DEXCOM G6 RECEIVER) DEVI For continuous blood glucose monitoring 4 times a day; E10.42 1 each 0   Continuous Blood Gluc Sensor (DEXCOM G6 SENSOR) MISC USE FOR CONTINUOUS BLOOD GLUCOSE MONITORING FOUR TIMES A DAY 9 each 3   Continuous Glucose Transmitter (DEXCOM G6 TRANSMITTER) MISC USE FOR CONTINUOUS  BLOOD GLUCOSE MONITORING FOUR TIMES A DAY 1 each 3   erythromycin ophthalmic ointment Place a 1/2 inch ribbon of ointment into the left lower eyelid 4 times daily for 7 days. 3.5 g 0   Glucagon 3 MG/DOSE POWD Place 3 mg into the nose once as needed for up to 1 dose. 1 each 11   glucose blood (ONETOUCH VERIO) test strip Checks blood sugar 4 times daily 400 each 11   ibuprofen (ADVIL) 200 MG tablet Take 200 mg by mouth every 6 (six) hours as needed for headache or mild pain.     insulin glargine-yfgn (SEMGLEE, YFGN,) 100 UNIT/ML Pen INJECT 60-70 UNITS UNDER THE SKIN DAILY 40 mL 3   Insulin Lispro-aabc (LYUMJEV KWIKPEN) 100 UNIT/ML KwikPen INJECT UNDER THE SKIN 23-30 UNITS BEFORE MEALS THREE TIMES A DAY 90 mL 0   oxyCODONE-acetaminophen (PERCOCET/ROXICET) 5-325 MG tablet Take 1 tablet by mouth every 6 (six) hours as needed for severe pain. 10 tablet 0   senna-docusate (SENOKOT-S) 8.6-50 MG tablet Take 1 tablet by mouth at bedtime as needed for mild constipation. 20 tablet 0   No current facility-administered medications on file prior to visit.   Allergies  Allergen Reactions   Levemir [Insulin Detemir]     Hives at injection sites   Augmentin [Amoxicillin-Pot Clavulanate]     Vomiting as young child   Omnicef [Cefdinir]     Vomiting as young child.   Family History  Problem Relation Age of Onset   Hypothyroidism Mother    Cancer Mother        appendix cancer   Diabetes Father        type 2   Hypothyroidism Paternal Grandmother    Diabetes Paternal Grandmother        type 2   Diabetes Cousin        type 1   PE: BP 118/70    Pulse (!) 118   Ht 5\' 7"  (1.702 m)   Wt 213 lb 6.4 oz (96.8 kg)   SpO2 98%   BMI 33.42 kg/m   Wt Readings from Last 3 Encounters:  08/29/23 213 lb 6.4 oz (96.8 kg)  07/07/23 210 lb (95.3 kg)  05/16/23 219 lb 6.4 oz (99.5 kg)   Constitutional: overweight, in NAD Eyes:  EOMI, no exophthalmos ENT: no neck masses, no cervical lymphadenopathy Cardiovascular: tachycardia, RR, No MRG Respiratory: CTA B Musculoskeletal: no deformities Skin:no rashes Neurological: no tremor with outstretched hands Diabetic Foot Exam - Simple   Simple Foot Form Diabetic Foot exam was performed with the following findings: Yes 08/29/2023  3:27 PM  Visual Inspection No deformities, no ulcerations, no other skin breakdown bilaterally: Yes Sensation Testing Intact to touch and monofilament testing bilaterally: Yes Pulse Check Posterior Tibialis and Dorsalis pulse intact bilaterally: Yes Comments    ASSESSMENT: 1. DM1, uncontrolled, without long-term complications, but with hyperglycemia  2. Overweight  PLAN:  1. Patient with longstanding, uncontrolled, type 1 diabetes, on basal/bolus insulin regimen, with poor control.  At last visit, HbA1c was higher, at 9.7%.  With discussed repeatedly at previous visit about trying to start an insulin pump and she agreed at last visit.  I advised her to call her insurance and see which insulin pump was covered.  Afterwards, to schedule an appointment with the diabetic educator.  She opted for the tandem t:slim X2 pump.  The supplies were sent through the [facility] app but she is not on the pump at this time. -In the past, she  was missing insulin but not so much anymore.  At last visit, we increased her Semglee in the morning and also Lyumjev with breakfast.   CGM interpretation: -At today's visit, we reviewed her CGM downloads: It appears that 12% of values are in target range (goal >70%), while 88% are higher than 180 (goal <25%), and 0% are lower than 70 (goal <4%).   The calculated average blood sugar is 287.  The projected HbA1c for the next 3 months (GMI) is 10.2%. -Reviewing the CGM trends, sugars are very high throughout the day, increasing significantly after approximately 9 AM and remaining elevated throughout the day and night, with extra hyperglycemic peak around 5 AM.  After this, sugars decrease but not quite to the normal range.  It does appear that her insulin is either degraded or she is not absorbing it well from the site.  Another possibility would be antibodies against the current insulin.  We discussed about rotating the injection sites, I am also going to suggest to take Lyumjev in a more concentrated form, U200 as she is injecting higher doses of the insulin, and I also recommended to try different insulin -given a sample of NovoLog and advised her to inject this 15 minutes before meals to see if this worked better for her.  I also advised her to increase the dose of Semglee but will leave it in just 1 dose as she is worried that she may forget the second dose. -she tells me that the pump was too expensive so she was not able to afford it but she is planning to change to Medicaid in the next few weeks and she will likely be able to afford it at that time.  I advised her to let me know as soon as she gets Medicaid. -I suggested to: Patient Instructions  Please increase: - Semglee 50-55 units daily  Use: - Lyumjev U200/NovoLog: 22-26 units before meals - Lyumjev sliding scale: Target 100 ISF 20    Try to change injection sites as discussed.  Let us know when you get M'aid.  Please schedule an appt with Cristy Folks for diabetes education - [phone].  Please stop at the lab.  Please return in 1.5-2 months.  - we checked her HbA1c: 10.2% (higher) - advised to check sugars at different times of the day - 4x a day, rotating check times - advised for yearly eye exams >> she is not UTD - will check annual labs today - return to clinic  in 1.5-2 months  2. Overweight -At last visits, we discussed about the importance of improving diet.  Her diet is hindered by her reverse circadian rhythm and also by the fact that she was only eating 1 meal a day, dinner, around 5-6 PM.  We did discuss in the past about trying to spread her meals throughout the day - not able to exercise 2/2 POTS - nausea, fatigue -she gained 17 lbs before last visit, previously gained 22.  She lost 3 pounds since then.  Office Visit on 08/29/2023  Component Date Value Ref Range Status   TSH 08/29/2023 1.47  0.35 - 5.50 uIU/mL Final   Microalb, Ur 08/29/2023 1.2  0.0 - 1.9 mg/dL Final   Creatinine,U 09/81/1914 134.6  mg/dL Final   Microalb Creat Ratio 08/29/2023 0.9  0.0 - 30.0 mg/g Final   Cholesterol 08/29/2023 304 (H)  0 - 200 mg/dL Final   ATP III Classification       Desirable:  < 200  mg/dL               Borderline High:  200 - 239 mg/dL          High:  > = 387 mg/dL   Triglycerides 56/43/3295 132.0  0.0 - 149.0 mg/dL Final   Normal:  <188 mg/dLBorderline High:  150 - 199 mg/dL   HDL 41/66/0630 16.01  >39.00 mg/dL Final   VLDL 09/32/3557 26.4  0.0 - 40.0 mg/dL Final   LDL Cholesterol 08/29/2023 218 (H)  0 - 99 mg/dL Final   Total CHOL/HDL Ratio 08/29/2023 5   Final                  Men          Women1/2 Average Risk     3.4          3.3Average Risk          5.0          4.42X Average Risk          9.6          7.13X Average Risk          15.0          11.0                       NonHDL 08/29/2023 244.29   Final   NOTE:  Non-HDL goal should be 30 mg/dL higher than patient's LDL goal (i.e. LDL goal of < 70 mg/dL, would have non-HDL goal of < 100 mg/dL)   Sodium 32/20/2542 706  135 - 145 mEq/L Final   Potassium 08/29/2023 4.0  3.5 - 5.1 mEq/L Final   Chloride 08/29/2023 101  96 - 112 mEq/L Final   CO2 08/29/2023 25  19 - 32 mEq/L Final   Glucose, Bld 08/29/2023 110 (H)  70 - 99 mg/dL Final   BUN 23/76/2831 11  6 - 23 mg/dL Final   Creatinine, Ser  08/29/2023 0.73  0.40 - 1.20 mg/dL Final   Total Bilirubin 08/29/2023 0.6  0.2 - 1.2 mg/dL Final   Alkaline Phosphatase 08/29/2023 81  39 - 117 U/L Final   AST 08/29/2023 13  0 - 37 U/L Final   ALT 08/29/2023 14  0 - 35 U/L Final   Total Protein 08/29/2023 7.3  6.0 - 8.3 g/dL Final   Albumin 51/76/1607 4.2  3.5 - 5.2 g/dL Final   GFR 37/09/6268 113.82  >60.00 mL/min Final   Calculated using the CKD-EPI Creatinine Equation (2021)   Calcium 08/29/2023 9.8  8.4 - 10.5 mg/dL Final   Hemoglobin S8N 08/29/2023 10.2 (A)  4.0 - 5.6 % Final  Labs are normal with the exception of a very high LDL cholesterol, in the 200s.  This could be related to the very uncontrolled diabetes.  For now, I would like to refer her to nutrition and to repeat the Lipid panel at next visit.  I am reticent to start statins for her due to age, but we may need to do so if the LDL remains elevated.  Carlus Pavlov, MD PhD [facility] Endocrinology

## 2023-08-30 LAB — MICROALBUMIN / CREATININE URINE RATIO
Creatinine,U: 134.6 mg/dL
Microalb Creat Ratio: 0.9 mg/g (ref 0.0–30.0)
Microalb, Ur: 1.2 mg/dL (ref 0.0–1.9)

## 2023-08-30 LAB — COMPREHENSIVE METABOLIC PANEL
ALT: 14 U/L (ref 0–35)
AST: 13 U/L (ref 0–37)
Albumin: 4.2 g/dL (ref 3.5–5.2)
Alkaline Phosphatase: 81 U/L (ref 39–117)
BUN: 11 mg/dL (ref 6–23)
CO2: 25 mEq/L (ref 19–32)
Calcium: 9.8 mg/dL (ref 8.4–10.5)
Chloride: 101 mEq/L (ref 96–112)
Creatinine, Ser: 0.73 mg/dL (ref 0.40–1.20)
GFR: 113.82 mL/min (ref >60.00)
Glucose, Bld: 110 mg/dL — ABNORMAL HIGH (ref 70–99)
Potassium: 4 mEq/L (ref 3.5–5.1)
Sodium: 139 mEq/L (ref 135–145)
Total Bilirubin: 0.6 mg/dL (ref 0.2–1.2)
Total Protein: 7.3 g/dL (ref 6.0–8.3)

## 2023-08-30 LAB — LIPID PANEL
Cholesterol: 304 mg/dL — ABNORMAL HIGH (ref 0–200)
HDL: 59.7 mg/dL (ref >39.00)
LDL Cholesterol: 218 mg/dL — ABNORMAL HIGH (ref 0–99)
NonHDL: 244.29
Total CHOL/HDL Ratio: 5
Triglycerides: 132 mg/dL (ref 0.0–149.0)
VLDL: 26.4 mg/dL (ref 0.0–40.0)

## 2023-08-30 LAB — TSH: TSH: 1.47 u[IU]/mL (ref 0.35–5.50)

## 2023-08-30 NOTE — Telephone Encounter (Signed)
Error

## 2023-10-10 ENCOUNTER — Ambulatory Visit: Payer: BC Managed Care – PPO | Admitting: Internal Medicine

## 2023-10-12 ENCOUNTER — Ambulatory Visit: Payer: Medicaid Other | Admitting: Internal Medicine

## 2023-11-01 ENCOUNTER — Ambulatory Visit: Payer: Medicaid Other | Admitting: Internal Medicine

## 2023-11-01 ENCOUNTER — Encounter: Payer: Self-pay | Admitting: Internal Medicine

## 2023-11-01 VITALS — BP 120/70 | HR 95 | Ht 67.0 in | Wt 221.6 lb

## 2023-11-01 DIAGNOSIS — E1069 Type 1 diabetes mellitus with other specified complication: Secondary | ICD-10-CM

## 2023-11-01 DIAGNOSIS — E785 Hyperlipidemia, unspecified: Secondary | ICD-10-CM

## 2023-11-01 DIAGNOSIS — E663 Overweight: Secondary | ICD-10-CM | POA: Diagnosis not present

## 2023-11-01 DIAGNOSIS — E1065 Type 1 diabetes mellitus with hyperglycemia: Secondary | ICD-10-CM

## 2023-11-01 DIAGNOSIS — E1169 Type 2 diabetes mellitus with other specified complication: Secondary | ICD-10-CM

## 2023-11-01 LAB — POCT GLYCOSYLATED HEMOGLOBIN (HGB A1C): Hemoglobin A1C: 9.5 % — AB (ref 4.0–5.6)

## 2023-11-01 MED ORDER — INSULIN ASPART 100 UNIT/ML IJ SOLN: INTRAMUSCULAR | 11 refills | Status: DC

## 2023-11-01 MED ORDER — NOVOLOG FLEXPEN 100 UNIT/ML ~~LOC~~ SOPN: 20.0000 [IU] | PEN_INJECTOR | Freq: Three times a day (TID) | SUBCUTANEOUS | 11 refills | Status: AC

## 2023-11-01 MED ORDER — DEXCOM G7 SENSOR MISC: 3.0000 | 4 refills | Status: DC

## 2023-11-01 NOTE — Patient Instructions (Addendum)
Please continue: - Semglee 50-55 units daily  Try to increase: - NovoLog: 22-26 >> 30-35 units before meals - NovoLog sliding scale: Target 100 ISF 20   Please schedule an appt with Cristy Folks for diabetes education and with a nutritionist for dietary advice and carb counting refresher: [phone].  Look up the following apps:  Calorie Brooke Dare, Myfitness pal. Also, [employer].  Please stop at the lab.  Please return in 1.5-2 months.

## 2023-11-01 NOTE — Progress Notes (Signed)
Patient ID: Veronica Liu, female   DOB: [birth year], 27 y.o.   MRN: [id number]  HPI: Veronica Liu is a 27 y.o.-year-old female, returning for follow-up for DM1, dx'ed at 27 y/o as DM2, and DM1 at 27 y/o, on insulin since 27 y/o uncontrolled, with long-term complications (PN, gastoparesis).  She was previously followed by Dr. Fransico Liu and then Veronica Liu with pediatric endocrinology at [facility].  Last visit with 2 months ago.  Interim history: She has some increased urination but no nausea or blurry vision. She has a reverse circadian rhythm, staying up during the night and waking up around 3-4 PM.  Prev. Skipping insulin doses b/c of this not bolusing approximately 4 times a day for meals, .   She cut out red meat, pork, sausage since last visit. She recently established care with Dr. Lyn Liu at [facility].  Reviewed HbA1c levels: Lab Results  Component Value Date   HGBA1C 10.2 (A) 08/29/2023   HGBA1C 9.7 (A) 11/16/2022   HGBA1C 9.2 (A) 03/19/2022   HGBA1C 8.6 (H) 10/12/2021   HGBA1C 8.0 (A) 07/28/2021   HGBA1C 8.6 (A) 03/17/2021   HGBA1C 8.0 (A) 12/16/2020   HGBA1C 8.1 (A) 09/15/2020   HGBA1C 9.0 (A) 04/01/2020   HGBA1C 9.1 (A) 10/09/2019   HGBA1C 9.0 (A) 06/07/2019   HGBA1C 9.1 (A) 02/01/2019   HGBA1C 9.2 (A) 05/19/2018   HGBA1C 8.7 08/05/2014   HGBA1C 9.9 03/05/2014   HGBA1C 8.0 09/06/2013   HGBA1C 10.8 06/19/2013   HGBA1C 12.5 (H) 06/04/2013   HGBA1C 14.8 (H) 01/17/2013  02/27/2018: HbA1c 9.4% 11/11/2017: HbA1c 8.8% 08/16/2017: HbA1c 8.9% 04/18/2017: HbA1c 9.3% 06/23/2016: HbA1c 12%  Previously on: -Lantus 40 >> 34 units at bedtime >> 17 >> 25 units 2x a day >> Tresiba 40 units daily (not covered) >> Semglee 25 units 2x a day -Humalog >> Lyumjev: ICR  (not actually using an ICR) - 12-14 units -before her dinner and 6-8 units before the other meals Target 100 ISF 25 >> 20 We tried Metformin 1000 mg with dinner-added 06/2019 >> stopped due to  diarrhea.  At last visit she was on: - Semglee             35 >> 40 in am and 20 units in the evening >> 45 >> 50 units at night - Lyumjev: 20-25 units before meals >> 20-26 units before meals (Lyumjev U200) - Lyumjev sliding scale: Target 100 ISF 20    She checks her sugars more than 4 times a day with her Dexcom CGM:  Previously:  Prev.:   Lowest sugar was 50s x1 in the middle of the night >> 70 >> 65 >> 65.  No previous hypoglycemia admissions.  She does have a glucagon kit at home. Highest sugar was >> HI >> HI. She had an ED visit for DKA on 10/11/2021.  She also has a [employer].  Pt's meals are: - Breakfast: skips b/c gastroparesis (nausea) - prev. Reglan, now phenergan - Lunch: sandwich + chips - Dinner: meat + veggies + bread - Snacks: cheese sticks, carrots  No CKD: Lab Results  Component Value Date   BUN 11 08/29/2023   BUN 8 12/09/2022   CREATININE 0.73 08/29/2023   CREATININE 0.63 12/09/2022   Lab Results  Component Value Date   MICRALBCREAT 0.9 08/29/2023   MICRALBCREAT 1.1 11/10/2020   MICRALBCREAT 4 06/07/2019   MICRALBCREAT 10.7 03/05/2014   MICRALBCREAT 20.0 06/03/2013   + HL: Lab Results  Component Value Date   CHOL 304 (H) 08/29/2023   HDL 59.70 08/29/2023   LDLCALC 218 (H) 08/29/2023   TRIG 132.0 08/29/2023   CHOLHDL 5 08/29/2023  She is not on a statin.    - last eye exam was in 05/2020: No DR reportedly.  -+ Occasional numbness and tingling in her feet. Last foot exam 08/29/2023.  Latest TSH was normal: Lab Results  Component Value Date   TSH 1.47 08/29/2023   Pt has FH of DM2 in father and PGM.  Cousin with DM1.  She has a history of depression.  In fall of 2022, she had a panic attack, after which she started to feel very dizzy and feel very poorly.  Her blood pressure started to fluctuate significantly and she also started to have nausea, abdominal pain, anxiety, palpitations, shortness of breath, chest pain,  blurry vision, daily migraines, constipation, general muscle aches.  She was investigated by cardiology and had a Holter monitor and also had a 2D echo.  Heart investigation was negative.  She was dx'ed with POTS - sees Dr. Graciela Liu. She tried metoprolol but blood pressure got too low while lying down.  She was smoking half a pack a day >> quit in 10/2021.  ROS: + see HPI  I reviewed pt's medications, allergies, PMH, social hx, family hx, and changes were documented in the history of present illness. Otherwise, unchanged from my initial visit note.  Past Medical History:  Diagnosis Date   Anxiety    Depression    Diabetes mellitus type I (HCC)    onset age 36   Diabetic gastroparesis (HCC)    GERD (gastroesophageal reflux disease)    POTS (postural orthostatic tachycardia syndrome)    Pott's disease    Past Surgical History:  Procedure Laterality Date   NO PAST SURGERIES     Social History   Socioeconomic History   Marital status: Single    Spouse name: Not on file   Number of children: 0  Occupational History   N/a  Tobacco Use   Smoking status: Current Every Day Smoker    Packs/day: 0.50    Years: 5.00    Pack years: 2.50    Types: Cigarettes   Smokeless tobacco: Never Used   Tobacco comment: NA  Substance and Sexual Activity   Alcohol use: Never    Frequency: Never   Drug use: Never   Sexual activity: Yes    Birth control/protection: None, Condom  Social History Narrative   Lives with mom, step-dad, brother, and sister. Sees bio-dad twice monthly.    Current Outpatient Medications on File Prior to Visit  Medication Sig Dispense Refill   acetone, urine, test strip Check ketones per protocol 50 each 3   BAYER MICROLET LANCETS lancets Check sugars 6 times daily and per protocol for hyper and hypoglycemia 250 each 3   BD PEN NEEDLE NANO 2ND GEN 32G X 4 MM MISC USE TO INJECT INSULIN VIA INSULIN PEN FIVE TIMES DAILY 450 each 3   Blood Glucose Monitoring Suppl (ONETOUCH  VERIO) w/Device KIT Use to check blood sugar 4 times a day     carvedilol (COREG) 6.25 MG tablet Take 1 tablet (6.25 mg total) by mouth 2 (two) times daily with a meal. 180 tablet 3   Continuous Blood Gluc Receiver (DEXCOM G6 RECEIVER) DEVI For continuous blood glucose monitoring 4 times a day; E10.42 1 each 0   Continuous Blood Gluc Sensor (DEXCOM G6 SENSOR) MISC USE FOR CONTINUOUS  BLOOD GLUCOSE MONITORING FOUR TIMES A DAY 9 each 3   Continuous Glucose Transmitter (DEXCOM G6 TRANSMITTER) MISC USE FOR CONTINUOUS BLOOD GLUCOSE MONITORING FOUR TIMES A DAY 1 each 3   erythromycin ophthalmic ointment Place a 1/2 inch ribbon of ointment into the left lower eyelid 4 times daily for 7 days. 3.5 g 0   Glucagon 3 MG/DOSE POWD Place 3 mg into the nose once as needed for up to 1 dose. 1 each 11   glucose blood (ONETOUCH VERIO) test strip Checks blood sugar 4 times daily 400 each 11   ibuprofen (ADVIL) 200 MG tablet Take 200 mg by mouth every 6 (six) hours as needed for headache or mild pain.     insulin glargine-yfgn (SEMGLEE, YFGN,) 100 UNIT/ML Pen INJECT 60-70 UNITS UNDER THE SKIN DAILY 40 mL 3   Insulin Lispro-aabc (LYUMJEV KWIKPEN) 200 UNIT/ML KwikPen INJECT UNDER THE SKIN 23-30 UNITS BEFORE MEALS THREE TIMES A DAY 45 mL 11   oxyCODONE-acetaminophen (PERCOCET/ROXICET) 5-325 MG tablet Take 1 tablet by mouth every 6 (six) hours as needed for severe pain. 10 tablet 0   senna-docusate (SENOKOT-S) 8.6-50 MG tablet Take 1 tablet by mouth at bedtime as needed for mild constipation. 20 tablet 0   SYRINGE/NEEDLE, DISP, 1 ML (BD SYRINGE SLIP TIP) 26G X 5/8" 1 ML MISC Use 3-4x a day 100 each 3   No current facility-administered medications on file prior to visit.   Allergies  Allergen Reactions   Levemir [Insulin Detemir]     Hives at injection sites   Augmentin [Amoxicillin-Pot Clavulanate]     Vomiting as young child   Omnicef [Cefdinir]     Vomiting as young child.   Family History  Problem Relation Age  of Onset   Hypothyroidism Mother    Cancer Mother        appendix cancer   Diabetes Father        type 2   Hypothyroidism Paternal Grandmother    Diabetes Paternal Grandmother        type 2   Diabetes Cousin        type 1   PE: BP 120/70   Pulse 95   Ht 5\' 7"  (1.702 m)   Wt 221 lb 9.6 oz (100.5 kg)   SpO2 93%   BMI 34.71 kg/m   Wt Readings from Last 3 Encounters:  11/01/23 221 lb 9.6 oz (100.5 kg)  08/29/23 213 lb 6.4 oz (96.8 kg)  07/07/23 210 lb (95.3 kg)   Constitutional: overweight, in NAD Eyes:  EOMI, no exophthalmos ENT: no neck masses, no cervical lymphadenopathy Cardiovascular: tachycardia, RR, No MRG Respiratory: CTA B Musculoskeletal: no deformities Skin:no rashes Neurological: no tremor with outstretched hands  ASSESSMENT: 1. DM1, uncontrolled, without long-term complications, but with hyperglycemia  2. HL  PLAN:  1. Patient with longstanding, uncontrolled, type 1 diabetes, on basal large bolus insulin regimen, with poor control.  At last visit, HbA1c was even higher than before, at 10.2%.  We discussed repeatedly at previous visits about starting an insulin pump which she agreed but she wanted to switch to Medicaid before starting it as she could not afford this.  She previously opted for the t:slim X2 insulin pump. -At last visit, we increased the dose of Semglee and also Lyumjev but I also gave her a sample of NovoLog and advised her to inject 50 minutes before meals to see if this was working better for her.  -At today's visit, she just changed to  Medicaid so she can now afford an insulin pump. CGM interpretation: -At today's visit, we reviewed her CGM downloads: It appears that 19% of values are in target range (goal >70%), while 81% are higher than 180 (goal <25%), and 0% are lower than 70 (goal <4%).  The calculated average blood sugar is 254.  The projected HbA1c for the next 3 months (GMI) is 9.4%. -Reviewing the CGM trends, sugars appear to be  slightly better than before but still very high, only dropping to the normal range when sleeping, which is late morning and midday.  Afterwards, sugars are increasing drastically despite the fact that she is taking Lyumjev insulin.  She did try NovoLog and she felt that this was working better.  Also, now on Medicaid, more NovoLog is preferred.  At today's visit I sent a prescription for NovoLog pens to her pharmacy. -We also discussed about starting on the t:slim insulin pump.  She was given a brochure and advised how to order the pump.  I will refer her to the diabetes educator to start this.  I also referred her to nutrition for carb counting refresher but also for her very high cholesterol. -I did recommend several phone apps to calculate carbs - we reviewed these -She does need high doses of insulin the day so we will try to start her on NovoLog but it is possible that we will need to switch to more concentrated insulin (U-500). -I suggested to: Patient Instructions  Please continue: - Semglee 50-55 units daily  Try to increase: - NovoLog: 22-26 >> 30-35 units before meals - NovoLog sliding scale: Target 100 ISF 20   Please schedule an appt with Cristy Folks for diabetes education and with a nutritionist for dietary advice and carb counting refresher: [phone].  Look up the following apps:  Calorie Brooke Dare, Myfitness pal. Also, Gluroo.  Please stop at the lab.  Please return in 1.5-2 months.  - we checked her HbA1c: 9.5% (improved) - advised to check sugars at different times of the day - 4x a day, rotating check times - advised for yearly eye exams >> she is UTD - return to clinic in 1.5-2 months  2. HL - Reviewed latest lipid panel from last visit: Very high LDL!,  Otherwise fractions at goal Lab Results  Component Value Date   CHOL 304 (H) 08/29/2023   HDL 59.70 08/29/2023   LDLCALC 218 (H) 08/29/2023   TRIG 132.0 08/29/2023   CHOLHDL 5 08/29/2023  -Not on a statin due  to weightbearing age.  At last visit, I advised her to try to change diet.  She did cut down on red and processed meat since last visit. -She mentions that she had another lipid panel checked by PCP as she just establish care with her recently.  I do not have access to these labs but she has another appointment with PCP next week and she will have the labs forwarded to me.  Carlus Pavlov, MD PhD [facility]

## 2023-11-09 ENCOUNTER — Ambulatory Visit: Payer: Medicaid Other | Attending: General Practice | Admitting: Internal Medicine

## 2023-11-09 ENCOUNTER — Ambulatory Visit: Payer: Medicaid Other | Admitting: Internal Medicine

## 2023-11-09 ENCOUNTER — Encounter: Payer: Self-pay | Admitting: Internal Medicine

## 2023-11-09 VITALS — BP 119/88 | HR 102 | Ht 67.0 in | Wt 215.0 lb

## 2023-11-09 DIAGNOSIS — R002 Palpitations: Secondary | ICD-10-CM | POA: Diagnosis not present

## 2023-11-09 DIAGNOSIS — G90A Postural orthostatic tachycardia syndrome (POTS): Secondary | ICD-10-CM

## 2023-11-09 NOTE — Progress Notes (Signed)
Electrophysiology TeleHealth Note      Date:  11/09/2023   ID:  VIRGIN GREISEN, DOB Apr 20, 1996, MRN [id number]  Location: patient's home  Provider location: [address] [place]  Evaluation Performed: Follow-up visit  PCP:  Sunnie Nielsen, DO  Cardiologist:    Electrophysiologist:  SK   Chief Complaint:  =  History of Present Illness:   [facility] The Patients location is their home. Veronica Liu is a 27 y.o. female who presents via audio/ conferencing for a telehealth visit today.  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 Fatigue and ongoing effort intolerance and tachy palpitations with exertion.    Ongoing problems with effort.  Recently has had some problems with peripheral edema  Problems with joint pain.  Her blood pressure is much improved.  We have tried her on beta-blockers for blood pressure and tachy palpitations.  She was so pleased with the benefit from the carvedilol, she did not try the other drugs, in this regard she is not interested in trying a different beta-block  Mental health is stable but not great.  She now has a bunch more friends who have been supportive  Gynecological evaluation was negative for PCOS   DATE TEST EF    12/22 Echo   65-70 %                          Date Cr K Hgb  1/24 0.63 3.9 16.1   8/24     14.9     The patient denies symptoms of fevers, chills, cough, or new SOB worrisome for COVID 19.   Past Medical History:  Diagnosis Date   Anxiety    Depression    Diabetes mellitus type I (HCC)    onset age 88   Diabetic gastroparesis (HCC)    GERD (gastroesophageal reflux disease)    POTS (postural orthostatic tachycardia syndrome)    Pott's disease      Past Surgical History:  Procedure Laterality Date   NO PAST SURGERIES      Current Outpatient Medications  Medication Sig Dispense Refill   acetone, urine, test strip Check ketones per protocol 50 each 3   BAYER MICROLET LANCETS lancets Check sugars 6 times daily and per protocol for hyper and hypoglycemia 250 each 3   BD PEN NEEDLE NANO 2ND GEN 32G X 4 MM MISC USE TO INJECT INSULIN VIA INSULIN PEN FIVE TIMES DAILY 450 each 3   Blood Glucose Monitoring Suppl (ONETOUCH VERIO) w/Device KIT Use to check blood sugar 4 times a day     carvedilol (COREG) 6.25 MG tablet Take 1 tablet (6.25 mg total) by mouth 2 (two) times daily with a meal. 180 tablet 3   Continuous Blood Gluc Receiver (DEXCOM G6 RECEIVER) DEVI For continuous blood glucose monitoring 4 times a day; E10.42 1 each 0   Continuous Glucose Sensor (DEXCOM G7 SENSOR) MISC 3 each by Does not apply route every 30 (thirty) days. Apply 1 sensor every 10 days 9 each 4   Glucagon 3 MG/DOSE POWD Place 3 mg into the nose once as needed for up to 1 dose. 1 each 11   glucose blood (ONETOUCH VERIO) test strip Checks blood sugar 4 times daily  400 each 11   ibuprofen (ADVIL) 200 MG tablet Take 200 mg by mouth every 6 (six) hours as needed for headache or mild pain.     insulin aspart (NOVOLOG FLEXPEN) 100 UNIT/ML FlexPen Inject 20-35 Units into the skin 3 (three) times daily with meals. 30 mL 11   insulin aspart (NOVOLOG) 100 UNIT/ML injection Use up to 150 units a day in the insulin pump as advised 30 mL 11   insulin glargine-yfgn (SEMGLEE, YFGN,) 100 UNIT/ML Pen INJECT 60-70 UNITS UNDER THE SKIN DAILY 40 mL 3   Insulin Lispro-aabc (LYUMJEV KWIKPEN) 200 UNIT/ML KwikPen INJECT UNDER THE SKIN 23-30 UNITS BEFORE MEALS THREE TIMES A DAY 45 mL 11   SYRINGE/NEEDLE, DISP, 1 ML (BD SYRINGE SLIP TIP) 26G X 5/8" 1 ML MISC Use 3-4x a day 100 each 3   erythromycin ophthalmic ointment Place a 1/2 inch ribbon of ointment into the left lower eyelid 4 times daily  for 7 days. (Patient not taking: Reported on 11/09/2023) 3.5 g 0   oxyCODONE-acetaminophen (PERCOCET/ROXICET) 5-325 MG tablet Take 1 tablet by mouth every 6 (six) hours as needed for severe pain. (Patient not taking: Reported on 11/09/2023) 10 tablet 0   senna-docusate (SENOKOT-S) 8.6-50 MG tablet Take 1 tablet by mouth at bedtime as needed for mild constipation. (Patient not taking: Reported on 11/09/2023) 20 tablet 0   No current facility-administered medications for this visit.    Allergies:   Levemir [insulin detemir], Augmentin [amoxicillin-pot clavulanate], and Omnicef [cefdinir]   ROS:  Please see the history of present illness.   All other systems are personally reviewed and negative.    Exam:     Vital Signs:  BP 119/88 (BP Location: Left Arm, Patient Position: Sitting, Cuff Size: Large)   Pulse (!) 102   Ht 5\' 7"  (1.702 m)   Wt 215 lb (97.5 kg)   BMI 33.67 kg/m     Well appearing, alert and conversant, regular work of breathing,  good skin color Eyes- anicteric, neuro- grossly intact, skin- no apparent rash or lesions or cyanosis, mouth- oral mucosa is pink No evidence of joint laxity   Labs/Other Tests and Data Reviewed:    Recent Labs: 07/11/2023: Hemoglobin 14.9; Platelets 278 08/29/2023: ALT 14; BUN 11; Creatinine, Ser 0.73; Potassium 4.0; Sodium 139; TSH 1.47   Wt Readings from Last 3 Encounters:  11/09/23 215 lb (97.5 kg)  11/01/23 221 lb 9.6 oz (100.5 kg)  08/29/23 213 lb 6.4 oz (96.8 kg)          ASSESSMENT & PLAN:    POTS/sinus tachycardia ( ? 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  Spectrum   Diabetes Mellitus Type 1  (life long)    Polycythemia  Given her palpitations, and the question of PVCs, we will check a Zio patch to try to elucidate. Blood pressure is much better on the carvedilol.  She was  not interested in a different beta-blocker, 1 which I thought might have more impact on heart rate for the amount of impact on blood pressure.  Will use the elusive Zio patch as noted, may try adding ivabradine to her carvedilol.  Subsequently.  We have asked her to decrease her sodium intake    Follow-up:  54m    Current medicines are reviewed at length with the patient today.   The patient  concerns regarding her medicines.  The following changes were made today:    Labs/ tests ordered today include:  No orders of the defined types were placed in this encounter.     Today, I have spent 21 minutes with the patient with telehealth technology discussing the above.  Signed, Sherryl Manges, MD  11/09/2023 3:28 PM     [facility] [address] [place] [address] [phone] (office) [phone] (fax)

## 2023-11-15 ENCOUNTER — Ambulatory Visit: Payer: Medicaid Other | Attending: Internal Medicine

## 2023-11-15 DIAGNOSIS — R002 Palpitations: Secondary | ICD-10-CM

## 2023-11-15 NOTE — Addendum Note (Signed)
Addended by: Alois Cliche on: 11/15/2023 11:06 AM   Modules accepted: Orders

## 2023-11-15 NOTE — Progress Notes (Unsigned)
Enrolled for [employer] to mail a ZIO XT long term holter monitor to the patients address on file.  

## 2023-11-15 NOTE — Patient Instructions (Signed)
Medication Instructions:  Your physician recommends that you continue on your current medications as directed. Please refer to the Current Medication list given to you today.  *If you need a refill on your cardiac medications before your next appointment, please call your pharmacy*   Lab Work: None ordered.  If you have labs (blood work) drawn today and your tests are completely normal, you will receive your results only by: MyChart Message (if you have MyChart) OR A paper copy in the mail If you have any lab test that is abnormal or we need to change your treatment, we will call you to review the results.   Testing/Procedures: None ordered.    Follow-Up: At [facility], you and your health needs are our priority.  As part of our continuing mission to provide you with exceptional heart care, we have created designated Provider Care Teams.  These Care Teams include your primary Cardiologist (physician) and Advanced Practice Providers (APPs -  Physician Assistants and Nurse Practitioners) who all work together to provide you with the care you need, when you need it.  We recommend signing up for the patient portal called "MyChart".  Sign up information is provided on this After Visit Summary.  MyChart is used to connect with patients for Virtual Visits (Telemedicine).  Patients are able to view lab/test results, encounter notes, upcoming appointments, etc.  Non-urgent messages can be sent to your provider as well.   To learn more about what you can do with MyChart, go to [email or web address].    Your next appointment:   4 months with Dr Graciela Husbands - someone will call you to schedule  Other Instructions ZIO XT- Long Term Monitor Instructions  Your physician has requested you wear a ZIO patch monitor for 14 days.  This is a single patch monitor. [employer] supplies one patch monitor per enrollment. Additional stickers are not available. Please do not apply patch if you will be  having a Nuclear Stress Test,  Echocardiogram, Cardiac CT, MRI, or Chest Xray during the period you would be wearing the  monitor. The patch cannot be worn during these tests. You cannot remove and re-apply the  ZIO XT patch monitor.  Your ZIO patch monitor will be mailed 3 day USPS to your address on file. It may take 3-5 days  to receive your monitor after you have been enrolled.  Once you have received your monitor, please review the enclosed instructions. Your monitor  has already been registered assigning a specific monitor serial # to you.  Billing and Patient Assistance Program Information  We have supplied [employer] with any of your insurance information on file for billing purposes. [employer] offers a sliding scale Patient Assistance Program for patients that do not have  insurance, or whose insurance does not completely cover the cost of the ZIO monitor.  You must apply for the Patient Assistance Program to qualify for this discounted rate.  To apply, please call [employer] at [phone], select option 4, select option 2, ask to apply for  Patient Assistance Program. Meredeth Ide will ask your household income, and how many people  are in your household. They will quote your out-of-pocket cost based on that information.  [employer] will also be able to set up a 27-month, interest-free payment plan if needed.  Applying the monitor   Shave hair from upper left chest.  Hold abrader disc by orange tab. Rub abrader in 40 strokes over the upper left chest as  indicated in your monitor instructions.  Clean area with 4 enclosed alcohol pads. Let dry.  Apply patch as indicated in monitor instructions. Patch will be placed under collarbone on left  side of chest with arrow pointing upward.  Rub patch adhesive wings for 2 minutes. Remove white label marked "1". Remove the white  label marked "2". Rub patch adhesive wings for 2 additional minutes.  While looking in a mirror, press and release button  in center of patch. A small green light will  flash 3-4 times. This will be your only indicator that the monitor has been turned on.  Do not shower for the first 24 hours. You may shower after the first 24 hours.  Press the button if you feel a symptom. You will hear a small click. Record Date, Time and  Symptom in the Patient Logbook.  When you are ready to remove the patch, follow instructions on the last 2 pages of Patient  Logbook. Stick patch monitor onto the last page of Patient Logbook.  Place Patient Logbook in the blue and white box. Use locking tab on box and tape box closed  securely. The blue and white box has prepaid postage on it. Please place it in the mailbox as  soon as possible. Your physician should have your test results approximately 7 days after the  monitor has been mailed back to [facility].  Call [facility] Customer Care at [phone] if you have questions regarding  your ZIO XT patch monitor. Call them immediately if you see an orange light blinking on your  monitor.  If your monitor falls off in less than 4 days, contact our Monitor department at [phone].  If your monitor becomes loose or falls off after 4 days call [employer] at [phone] for  suggestions on securing your monitor

## 2023-11-22 DIAGNOSIS — R002 Palpitations: Secondary | ICD-10-CM

## 2023-12-23 ENCOUNTER — Ambulatory Visit: Payer: Medicaid Other | Admitting: Internal Medicine

## 2023-12-23 NOTE — Progress Notes (Deleted)
Patient ID: Veronica Liu, female   DOB: Mar 02, 1996, 28 y.o.   MRN: [id number]  HPI: KENDELLE PIRKL is a 28 y.o.-year-old female, returning for follow-up for DM1, dx'ed at 28 y/o as DM2, and DM1 at 28 y/o, on insulin since 28 y/o uncontrolled, with long-term complications (PN, gastoparesis).  She was previously followed by Dr. Fransico Michael and then Rhys Martini with pediatric endocrinology at [facility].  Last visit with 2 months ago. PCP: Dr. Lyn Hollingshead at [facility].  Interim history: She has some increased urination but no nausea or blurry vision. She has a reverse circadian rhythm, staying up during the night and waking up around 3-4 PM.  Previously skipping many insulin doses. She cut out red meat, pork, sausage before last visit.  Reviewed HbA1c levels: Lab Results  Component Value Date   HGBA1C 9.5 (A) 11/01/2023   HGBA1C 10.2 (A) 08/29/2023   HGBA1C 9.7 (A) 11/16/2022   HGBA1C 9.2 (A) 03/19/2022   HGBA1C 8.6 (H) 10/12/2021   HGBA1C 8.0 (A) 07/28/2021   HGBA1C 8.6 (A) 03/17/2021   HGBA1C 8.0 (A) 12/16/2020   HGBA1C 8.1 (A) 09/15/2020   HGBA1C 9.0 (A) 04/01/2020   HGBA1C 9.1 (A) 10/09/2019   HGBA1C 9.0 (A) 06/07/2019   HGBA1C 9.1 (A) 02/01/2019   HGBA1C 9.2 (A) 05/19/2018   HGBA1C 8.7 08/05/2014   HGBA1C 9.9 03/05/2014   HGBA1C 8.0 09/06/2013   HGBA1C 10.8 06/19/2013   HGBA1C 12.5 (H) 06/04/2013   HGBA1C 14.8 (H) 01/17/2013  02/27/2018: HbA1c 9.4% 11/11/2017: HbA1c 8.8% 08/16/2017: HbA1c 8.9% 04/18/2017: HbA1c 9.3% 06/23/2016: HbA1c 12%  Previously on: -Lantus 40 >> 34 units at bedtime >> 17 >> 25 units 2x a day >> Tresiba 40 units daily (not covered) >> Semglee 25 units 2x a day -Humalog >> Lyumjev: ICR  (not actually using an ICR) - 12-14 units -before her dinner and 6-8 units before the other meals Target 100 ISF 25 >> 20 We tried Metformin 1000 mg with dinner-added 06/2019 >> stopped due to diarrhea.  Now o: - Semglee             35 >> 40 in am and 20  units in the evening >> 45 >> 50 units at night  - Lyumjev: 20-25 units before meals >> 20-26 units before meals (Lyumjev U200) >> NovoLog 30-35 units before meals - Lyumjev sliding scale: Target 100 ISF 20   She checks her sugars more than 4 times a day with her Dexcom CGM:  Previously:  Previously:   Lowest sugar was 50s x1 in the middle of the night >> ... 65.  No previous hypoglycemia admissions.  She does have a glucagon kit at home. Highest sugar was: HI. She had an ED visit for DKA on 10/11/2021.  She also has a [employer].  Pt's meals are: - Breakfast: skips b/c gastroparesis (nausea) - prev. Reglan, now phenergan - Lunch: sandwich + chips - Dinner: meat + veggies + bread - Snacks: cheese sticks, carrots  No CKD: Lab Results  Component Value Date   BUN 11 08/29/2023   BUN 8 12/09/2022   CREATININE 0.73 08/29/2023   CREATININE 0.63 12/09/2022   Lab Results  Component Value Date   MICRALBCREAT 0.9 08/29/2023   MICRALBCREAT 1.1 11/10/2020   MICRALBCREAT 4 06/07/2019   MICRALBCREAT 10.7 03/05/2014   MICRALBCREAT 20.0 06/03/2013   + HL: Lab Results  Component Value Date   CHOL 304 (H) 08/29/2023   HDL 59.70 08/29/2023  LDLCALC 218 (H) 08/29/2023   TRIG 132.0 08/29/2023   CHOLHDL 5 08/29/2023  She is not on a statin.    - last eye exam was in 05/2020: No DR reportedly.  -+ Occasional numbness and tingling in her feet. Last foot exam 08/29/2023.  Latest TSH was normal: Lab Results  Component Value Date   TSH 1.47 08/29/2023   Pt has FH of DM2 in father and PGM.  Cousin with DM1.  She has a history of depression.  In fall of 2022, she had a panic attack, after which she started to feel very dizzy and feel very poorly.  Her blood pressure started to fluctuate significantly and she also started to have nausea, abdominal pain, anxiety, palpitations, shortness of breath, chest pain, blurry vision, daily migraines, constipation, general muscle  aches.  She was investigated by cardiology and had a Holter monitor and also had a 2D echo.  Heart investigation was negative.  She was dx'ed with POTS - sees Dr. Graciela Husbands. She tried metoprolol but blood pressure got too low while lying down.  She was smoking half a pack a day >> quit in 10/2021.  ROS: + see HPI  I reviewed pt's medications, allergies, PMH, social hx, family hx, and changes were documented in the history of present illness. Otherwise, unchanged from my initial visit note.  Past Medical History:  Diagnosis Date   Anxiety    Depression    Diabetes mellitus type I (HCC)    onset age 28   Diabetic gastroparesis (HCC)    GERD (gastroesophageal reflux disease)    POTS (postural orthostatic tachycardia syndrome)    Pott's disease    Past Surgical History:  Procedure Laterality Date   NO PAST SURGERIES     Social History   Socioeconomic History   Marital status: Single    Spouse name: Not on file   Number of children: 0  Occupational History   N/a  Tobacco Use   Smoking status: Current Every Day Smoker    Packs/day: 0.50    Years: 5.00    Pack years: 2.50    Types: Cigarettes   Smokeless tobacco: Never Used   Tobacco comment: NA  Substance and Sexual Activity   Alcohol use: Never    Frequency: Never   Drug use: Never   Sexual activity: Yes    Birth control/protection: None, Condom  Social History Narrative   Lives with mom, step-dad, brother, and sister. Sees bio-dad twice monthly.    Current Outpatient Medications on File Prior to Visit  Medication Sig Dispense Refill   acetone, urine, test strip Check ketones per protocol 50 each 3   BAYER MICROLET LANCETS lancets Check sugars 6 times daily and per protocol for hyper and hypoglycemia 250 each 3   BD PEN NEEDLE NANO 2ND GEN 32G X 4 MM MISC USE TO INJECT INSULIN VIA INSULIN PEN FIVE TIMES DAILY 450 each 3   Blood Glucose Monitoring Suppl (ONETOUCH VERIO) w/Device KIT Use to check blood sugar 4 times a day      carvedilol (COREG) 6.25 MG tablet Take 1 tablet (6.25 mg total) by mouth 2 (two) times daily with a meal. 180 tablet 3   Continuous Blood Gluc Receiver (DEXCOM G6 RECEIVER) DEVI For continuous blood glucose monitoring 4 times a day; E10.42 1 each 0   Continuous Glucose Sensor (DEXCOM G7 SENSOR) MISC 3 each by Does not apply route every 30 (thirty) days. Apply 1 sensor every 10 days 9 each 4  erythromycin ophthalmic ointment Place a 1/2 inch ribbon of ointment into the left lower eyelid 4 times daily for 7 days. (Patient not taking: Reported on 11/09/2023) 3.5 g 0   Glucagon 3 MG/DOSE POWD Place 3 mg into the nose once as needed for up to 1 dose. 1 each 11   glucose blood (ONETOUCH VERIO) test strip Checks blood sugar 4 times daily 400 each 11   ibuprofen (ADVIL) 200 MG tablet Take 200 mg by mouth every 6 (six) hours as needed for headache or mild pain.     insulin aspart (NOVOLOG FLEXPEN) 100 UNIT/ML FlexPen Inject 20-35 Units into the skin 3 (three) times daily with meals. 30 mL 11   insulin aspart (NOVOLOG) 100 UNIT/ML injection Use up to 150 units a day in the insulin pump as advised 30 mL 11   insulin glargine-yfgn (SEMGLEE, YFGN,) 100 UNIT/ML Pen INJECT 60-70 UNITS UNDER THE SKIN DAILY 40 mL 3   Insulin Lispro-aabc (LYUMJEV KWIKPEN) 200 UNIT/ML KwikPen INJECT UNDER THE SKIN 23-30 UNITS BEFORE MEALS THREE TIMES A DAY 45 mL 11   oxyCODONE-acetaminophen (PERCOCET/ROXICET) 5-325 MG tablet Take 1 tablet by mouth every 6 (six) hours as needed for severe pain. (Patient not taking: Reported on 11/09/2023) 10 tablet 0   senna-docusate (SENOKOT-S) 8.6-50 MG tablet Take 1 tablet by mouth at bedtime as needed for mild constipation. (Patient not taking: Reported on 11/09/2023) 20 tablet 0   SYRINGE/NEEDLE, DISP, 1 ML (BD SYRINGE SLIP TIP) 26G X 5/8" 1 ML MISC Use 3-4x a day 100 each 3   No current facility-administered medications on file prior to visit.   Allergies  Allergen Reactions   Levemir [Insulin  Detemir]     Hives at injection sites   Augmentin [Amoxicillin-Pot Clavulanate]     Vomiting as young child   Omnicef [Cefdinir]     Vomiting as young child.   Family History  Problem Relation Age of Onset   Hypothyroidism Mother    Cancer Mother        appendix cancer   Diabetes Father        type 2   Hypothyroidism Paternal Grandmother    Diabetes Paternal Grandmother        type 2   Diabetes Cousin        type 1   PE: There were no vitals taken for this visit.  Wt Readings from Last 3 Encounters:  11/09/23 215 lb (97.5 kg)  11/01/23 221 lb 9.6 oz (100.5 kg)  08/29/23 213 lb 6.4 oz (96.8 kg)   Constitutional: overweight, in NAD Eyes:  EOMI, no exophthalmos ENT: no neck masses, no cervical lymphadenopathy Cardiovascular: tachycardia, RR, No MRG Respiratory: CTA B Musculoskeletal: no deformities Skin:no rashes Neurological: no tremor with outstretched hands  ASSESSMENT: 1. DM1, uncontrolled, without long-term complications, but with hyperglycemia  2. HL  PLAN:  1. Patient with longstanding, uncontrolled, type 1 diabetes, on basal-bolus insulin regimen with poor control.  At last visit, HbA1c was lower, at 9.5%, but still significantly elevated.  Sugars were slightly better than before but still very high, dropping to the normal range only when sleeping, which was late morning and midday.  Afterwards, sugars were increasing drastically despite the fact that she was taking Lyumjev insulin.  I previously gave her a sample of NovoLog and she tried this and mentions that this was working better.  I sent a prescription for NovoLog to her pharmacy.  At last visit she just switch to [facility] and mentions  that insulin pumps were affordable.  I recommended the t:slim X2 pump I will refer her to the diabetes educator for discussion about this.  I also recommended a carb counting refresher and referred her to nutrition.  I recommend several phone apps to calculate the carbs. CGM  interpretation: -At today's visit, we reviewed her CGM downloads: It appears that *** of values are in target range (goal >70%), while *** are higher than 180 (goal <25%), and *** are lower than 70 (goal <4%).  The calculated average blood sugar is ***.  The projected HbA1c for the next 3 months (GMI) is ***. -Reviewing the CGM trends, ***  -I suggested to: Patient Instructions  Please continue: - Semglee 50-55 units daily - NovoLog: 30-35 units before meals - NovoLog sliding scale: Target 100 ISF 20   Please schedule an appt with Cristy Folks for diabetes education and with a nutritionist for dietary advice and carb counting refresher: [phone].  Look up the following apps:  Calorie Brooke Dare, Myfitness pal. Also, Gluroo.  Please return in 2 months.  - we checked her HbA1c: 7%  - advised to check sugars at different times of the day - 4x a day, rotating check times - advised for yearly eye exams >> she is not UTD - return to clinic in 2 months  2. HL -Reviewed latest lipid panel from 08/2023.  She had a very high LDL, otherwise fractions at goal: Lab Results  Component Value Date   CHOL 304 (H) 08/29/2023   HDL 59.70 08/29/2023   LDLCALC 218 (H) 08/29/2023   TRIG 132.0 08/29/2023   CHOLHDL 5 08/29/2023  -She is not on a statin due to weightbearing age.  I did discuss with her about changing the diet.  She cut down on red and processed meat before our visit from 10/2023.  At that time she mentions that she had another lipid panel with PCP but I did not have these results. -  Carlus Pavlov, MD PhD [facility] Endocrinology

## 2023-12-27 ENCOUNTER — Telehealth: Payer: Self-pay

## 2023-12-27 DIAGNOSIS — E1065 Type 1 diabetes mellitus with hyperglycemia: Secondary | ICD-10-CM

## 2023-12-27 MED ORDER — BD PEN NEEDLE NANO 2ND GEN 32G X 4 MM MISC: 3 refills | Status: AC

## 2023-12-27 NOTE — Telephone Encounter (Signed)
Requested Prescriptions   Signed Prescriptions Disp Refills   Insulin Pen Needle (BD PEN NEEDLE NANO 2ND GEN) 32G X 4 MM MISC 450 each 3    Sig: USE TO INJECT INSULIN VIA INSULIN PEN FIVE TIMES DAILY    Authorizing Provider: Carlus Pavlov    Ordering User: Pollie Meyer

## 2024-01-12 ENCOUNTER — Encounter: Payer: Self-pay | Admitting: Internal Medicine

## 2024-01-12 ENCOUNTER — Ambulatory Visit: Payer: Medicaid Other | Admitting: Internal Medicine

## 2024-01-12 VITALS — BP 122/80 | HR 112 | Resp 20 | Ht 67.0 in | Wt 230.6 lb

## 2024-01-12 DIAGNOSIS — E785 Hyperlipidemia, unspecified: Secondary | ICD-10-CM | POA: Diagnosis not present

## 2024-01-12 DIAGNOSIS — E1065 Type 1 diabetes mellitus with hyperglycemia: Secondary | ICD-10-CM | POA: Diagnosis not present

## 2024-01-12 DIAGNOSIS — E1069 Type 1 diabetes mellitus with other specified complication: Secondary | ICD-10-CM | POA: Diagnosis not present

## 2024-01-12 DIAGNOSIS — E1169 Type 2 diabetes mellitus with other specified complication: Secondary | ICD-10-CM

## 2024-01-12 DIAGNOSIS — E663 Overweight: Secondary | ICD-10-CM | POA: Diagnosis not present

## 2024-01-12 MED ORDER — DEXCOM G7 SENSOR MISC: 3.0000 | 4 refills | Status: AC

## 2024-01-12 MED ORDER — ACETONE (URINE) TEST VI STRP
1.0000 | ORAL_STRIP | 5 refills | Status: AC | PRN
Start: 2024-01-12 — End: ?

## 2024-01-12 MED ORDER — LANTUS SOLOSTAR 100 UNIT/ML ~~LOC~~ SOPN: 45.0000 [IU] | PEN_INJECTOR | Freq: Every day | SUBCUTANEOUS | 99 refills | Status: AC

## 2024-01-12 MED ORDER — GLUCAGON 3 MG/DOSE NA POWD: 3.0000 mg | Freq: Once | NASAL | 11 refills | Status: AC | PRN

## 2024-01-12 NOTE — Patient Instructions (Addendum)
 Please use the following regimen: - Semglee /Lantus  45 units daily - NovoLog : 25-30 units before meals - NovoLog  sliding scale: Target 100 ISF 20   Please schedule an appt with Rock Dasen to start on the pump.  Also, with the nutritionist for dietary advice and carb counting refresher: [phone].  Look up the following apps:  Calorie Myrna, Myfitness pal. Also, Gluroo.  Please return in 2 months.

## 2024-01-12 NOTE — Progress Notes (Signed)
 Patient ID: Veronica Liu, female   DOB: June 15, 1996, 28 y.o.   MRN: [id number] This note was precharted 12/23/2023.  HPI: Veronica Liu is a 28 y.o.-year-old female, returning for follow-up for DM1, dx'ed at 28 y/o as DM2, and DM1 at 28 y/o, on insulin  since 28 y/o uncontrolled, with long-term complications (PN, gastoparesis).  She was previously followed by Dr. Hershal and then [name] with pediatric endocrinology at [facility].  Last visit with 2.5 months ago. PCP: Dr. Marsa at [facility].  Interim history: She has some increased urination but no nausea or blurry vision. She has a reverse circadian rhythm, staying up during the night and waking up around 3-4 PM.  Previously skipping many insulin  doses. Since last visit, she had to come off the sensor as insurance did not approve it.  She tells me she needs a PA.  However, she tells me that she will receive the t:slim X2 insulin  pump in 3 to 4 days and this is covered for her at no cost.  Reviewed HbA1c levels: Lab Results  Component Value Date   HGBA1C 9.5 (A) 11/01/2023   HGBA1C 10.2 (A) 08/29/2023   HGBA1C 9.7 (A) 11/16/2022   HGBA1C 9.2 (A) 03/19/2022   HGBA1C 8.6 (H) 10/12/2021   HGBA1C 8.0 (A) 07/28/2021   HGBA1C 8.6 (A) 03/17/2021   HGBA1C 8.0 (A) 12/16/2020   HGBA1C 8.1 (A) 09/15/2020   HGBA1C 9.0 (A) 04/01/2020   HGBA1C 9.1 (A) 10/09/2019   HGBA1C 9.0 (A) 06/07/2019   HGBA1C 9.1 (A) 02/01/2019   HGBA1C 9.2 (A) 05/19/2018   HGBA1C 8.7 08/05/2014   HGBA1C 9.9 03/05/2014   HGBA1C 8.0 09/06/2013   HGBA1C 10.8 06/19/2013   HGBA1C 12.5 (H) 06/04/2013   HGBA1C 14.8 (H) 01/17/2013  02/27/2018: HbA1c 9.4% 11/11/2017: HbA1c 8.8% 08/16/2017: HbA1c 8.9% 04/18/2017: HbA1c 9.3% 06/23/2016: HbA1c 12%  Previously on: -Lantus  40 >> 34 units at bedtime >> 17 >> 25 units 2x a day >> Tresiba  40 units daily (not covered) >> Semglee  25 units 2x a day -Humalog  >> Lyumjev : ICR  (not actually using an ICR) - 12-14  units -before her dinner and 6-8 units before the other meals Target 100 ISF 25 >> 20 We tried Metformin  1000 mg with dinner-added 06/2019 >> stopped due to diarrhea.  Now o: - Semglee              35 >> 40 in am and 20 units in the evening >> 45 >> 50 >> actually 45 units at night (Semglee  not covered) - in thighs - Lyumjev  20-25 units before meals >> 20-26 units before meals (Lyumjev  U200) >> NovoLog  30-35 >> 20-25 units before meals - in thighs - Lyumjev  >> NovoLog  sliding scale: Target 100 ISF 20   She checks her sugars 2-4x a day: - am: sleeps - 2h after b'fast:n/c - lunch: 80-140 - 2h after lunch: n/c - dinner: 200-250 - 2h after dinner: 200-250 - bedtime:   Previously:  Previously:   Lowest sugar was 50s x1 in the middle of the night >> ... 65 >> 64.  No previous hypoglycemia admissions.  She does have a glucagon  kit at home. Highest sugar was: HI >> 400s. She had an ED visit for DKA on 10/11/2021.  She also has a [employer].  Pt's meals are: - Breakfast: skips b/c gastroparesis (nausea) - prev. Reglan, now phenergan  - Lunch: sandwich + chips - Dinner: meat + veggies + bread - Snacks: cheese sticks,  carrots She cut out red meat, pork, sausage before visit in 10/2023.  No CKD: Lab Results  Component Value Date   BUN 11 08/29/2023   BUN 8 12/09/2022   CREATININE 0.73 08/29/2023   CREATININE 0.63 12/09/2022   Lab Results  Component Value Date   MICRALBCREAT 0.9 08/29/2023   MICRALBCREAT 1.1 11/10/2020   MICRALBCREAT 4 06/07/2019   MICRALBCREAT 10.7 03/05/2014   MICRALBCREAT 20.0 06/03/2013   + HL: Lab Results  Component Value Date   CHOL 304 (H) 08/29/2023   HDL 59.70 08/29/2023   LDLCALC 218 (H) 08/29/2023   TRIG 132.0 08/29/2023   CHOLHDL 5 08/29/2023  She is not on a statin.    - last eye exam was in 05/2020: No DR reportedly.  -+ Occasional numbness and tingling in her feet. Last foot exam 08/29/2023.  Latest TSH was  normal: Lab Results  Component Value Date   TSH 1.47 08/29/2023   Pt has FH of DM2 in father and PGM.  Cousin with DM1.  She has a history of depression.  In fall of 2022, she had a panic attack, after which she started to feel very dizzy and feel very poorly.  Her blood pressure started to fluctuate significantly and she also started to have nausea, abdominal pain, anxiety, palpitations, shortness of breath, chest pain, blurry vision, daily migraines, constipation, general muscle aches.  She was investigated by cardiology and had a Holter monitor and also had a 2D echo.  Heart investigation was negative.  She was dx'ed with POTS - sees Dr. Fernande. She tried metoprolol  but blood pressure got too low while lying down.  She was smoking half a pack a day >> quit in 10/2021.  ROS: + see HPI  I reviewed pt's medications, allergies, PMH, social hx, family hx, and changes were documented in the history of present illness. Otherwise, unchanged from my initial visit note.  Past Medical History:  Diagnosis Date   Anxiety    Depression    Diabetes mellitus type I (HCC)    onset age 47   Diabetic gastroparesis (HCC)    GERD (gastroesophageal reflux disease)    POTS (postural orthostatic tachycardia syndrome)    Pott's disease    Past Surgical History:  Procedure Laterality Date   NO PAST SURGERIES     Social History   Socioeconomic History   Marital status: Single    Spouse name: Not on file   Number of children: 0  Occupational History   N/a  Tobacco Use   Smoking status: Current Every Day Smoker    Packs/day: 0.50    Years: 5.00    Pack years: 2.50    Types: Cigarettes   Smokeless tobacco: Never Used   Tobacco comment: NA  Substance and Sexual Activity   Alcohol use: Never    Frequency: Never   Drug use: Never   Sexual activity: Yes    Birth control/protection: None, Condom  Social History Narrative   Lives with mom, step-dad, brother, and sister. Sees bio-dad twice  monthly.    Current Outpatient Medications on File Prior to Visit  Medication Sig Dispense Refill   acetone, urine, test strip Check ketones per protocol 50 each 3   BAYER MICROLET LANCETS lancets Check sugars 6 times daily and per protocol for hyper and hypoglycemia 250 each 3   Blood Glucose Monitoring Suppl (ONETOUCH VERIO) w/Device KIT Use to check blood sugar 4 times a day     carvedilol  (COREG ) 6.25 MG  tablet Take 1 tablet (6.25 mg total) by mouth 2 (two) times daily with a meal. 180 tablet 3   Continuous Blood Gluc Receiver (DEXCOM G6 RECEIVER) DEVI For continuous blood glucose monitoring 4 times a day; E10.42 1 each 0   Continuous Glucose Sensor (DEXCOM G7 SENSOR) MISC 3 each by Does not apply route every 30 (thirty) days. Apply 1 sensor every 10 days 9 each 4   erythromycin  ophthalmic ointment Place a 1/2 inch ribbon of ointment into the left lower eyelid 4 times daily for 7 days. (Patient not taking: Reported on 11/09/2023) 3.5 g 0   Glucagon  3 MG/DOSE POWD Place 3 mg into the nose once as needed for up to 1 dose. 1 each 11   glucose blood (ONETOUCH VERIO) test strip Checks blood sugar 4 times daily 400 each 11   ibuprofen (ADVIL) 200 MG tablet Take 200 mg by mouth every 6 (six) hours as needed for headache or mild pain.     insulin  aspart (NOVOLOG  FLEXPEN) 100 UNIT/ML FlexPen Inject 20-35 Units into the skin 3 (three) times daily with meals. 30 mL 11   insulin  aspart (NOVOLOG ) 100 UNIT/ML injection Use up to 150 units a day in the insulin  pump as advised 30 mL 11   insulin  glargine-yfgn (SEMGLEE , YFGN,) 100 UNIT/ML Pen INJECT 60-70 UNITS UNDER THE SKIN DAILY 40 mL 3   Insulin  Lispro-aabc (LYUMJEV  KWIKPEN) 200 UNIT/ML KwikPen INJECT UNDER THE SKIN 23-30 UNITS BEFORE MEALS THREE TIMES A DAY 45 mL 11   Insulin  Pen Needle (BD PEN NEEDLE NANO 2ND GEN) 32G X 4 MM MISC USE TO INJECT INSULIN  VIA INSULIN  PEN FIVE TIMES DAILY 450 each 3   oxyCODONE -acetaminophen  (PERCOCET/ROXICET) 5-325 MG tablet  Take 1 tablet by mouth every 6 (six) hours as needed for severe pain. (Patient not taking: Reported on 11/09/2023) 10 tablet 0   senna-docusate (SENOKOT-S) 8.6-50 MG tablet Take 1 tablet by mouth at bedtime as needed for mild constipation. (Patient not taking: Reported on 11/09/2023) 20 tablet 0   SYRINGE/NEEDLE, DISP, 1 ML (BD SYRINGE SLIP TIP) 26G X 5/8 1 ML MISC Use 3-4x a day 100 each 3   No current facility-administered medications on file prior to visit.   Allergies  Allergen Reactions   Levemir [Insulin  Detemir]     Hives at injection sites   Augmentin [Amoxicillin-Pot Clavulanate]     Vomiting as young child   Omnicef [Cefdinir]     Vomiting as young child.   Family History  Problem Relation Age of Onset   Hypothyroidism Mother    Cancer Mother        appendix cancer   Diabetes Father        type 2   Hypothyroidism Paternal Grandmother    Diabetes Paternal Grandmother        type 2   Diabetes Cousin        type 1   PE: BP 122/80 (BP Location: Left Arm, Patient Position: Sitting, Cuff Size: Normal)   Pulse (!) 112   Resp 20   Ht 5' 7 (1.702 m)   Wt 230 lb 9.6 oz (104.6 kg)   SpO2 98%   BMI 36.12 kg/m   Wt Readings from Last 3 Encounters:  01/12/24 230 lb 9.6 oz (104.6 kg)  11/09/23 215 lb (97.5 kg)  11/01/23 221 lb 9.6 oz (100.5 kg)   Constitutional: overweight, in NAD Eyes:  EOMI, no exophthalmos ENT: no neck masses, no cervical lymphadenopathy Cardiovascular: tachycardia, RR, No  MRG Respiratory: CTA B Musculoskeletal: no deformities Skin:no rashes Neurological: + tremor with outstretched hands  ASSESSMENT: 1. DM1, uncontrolled, without long-term complications, but with hyperglycemia  2. HL  PLAN:  1. Patient with longstanding, uncontrolled, type 1 diabetes, on basal-bolus insulin  regimen with poor control.  At last visit, HbA1c was lower, at 9.5%, but still significantly elevated.  Sugars were slightly better than before but still very high, dropping  to the normal range only when sleeping, which was late morning and midday.  Afterwards, sugars were increasing drastically despite the fact that she was taking Lyumjev  insulin .  I previously gave her a sample of NovoLog  and she tried this and mentions that this was working better.  I sent a prescription for NovoLog  to her pharmacy.  At last visit she just switch to [facility] and mentions that insulin  pumps were affordable.  I recommended a pump (t:slim X2) and referred her to the diabetes educator for discussion about this.  I also recommended a carb counting refresher and referred her to nutrition.  I recommend several phone apps to calculate the carbs. - at today's visit, she is off th sensor and did not start a pump >> she will receive the t:slim x2 in 3-4 days (covered 100%) -At today's visit, she tells me that whenever she checks her sugars at waking up, the sugars are mostly at goal, but they do increase throughout the day.  Upon questioning, she changed her section site since last visit, and now uses the thighs.  She feels that the sugars have improved afterwards.  She is using lower Semglee  and NovoLog  doses, with slightly better blood sugars.  Sugars later in the day are still elevated, above target so I did recommend to use higher doses of NovoLog  but she is actually preparing to start on the insulin  pump and I suspect that the sugars will improve more afterwards. Patient Instructions  Please use the following regimen: - Semglee /Lantus  45 units daily - NovoLog : 25-30 units before meals - NovoLog  sliding scale: Target 100 ISF 20   Please schedule an appt with Rock Dasen to start on the pump.  Also, with the nutritionist for dietary advice and carb counting refresher: [phone].  Look up the following apps:  Calorie Myrna, Myfitness pal. Also, Gluroo.  Please return in 2 months.  - we checked her HbA1c: 9.3% (slightly better) - advised to check sugars at different times of the day -  4x a day, rotating check times - advised for yearly eye exams >> she is not UTD but plans to schedule this now that she has insurance. - return to clinic in 2 months  2. HL -Reviewing the latest lipid panel from 08/2023, her LDL is very high, otherwise fractions are at goal: Lab Results  Component Value Date   CHOL 304 (H) 08/29/2023   HDL 59.70 08/29/2023   LDLCALC 218 (H) 08/29/2023   TRIG 132.0 08/29/2023   CHOLHDL 5 08/29/2023  -She is not on a statin due to weightbearing age.  I did discuss about changing the diet with her.  She cut down on the red and processed meat before our visit from 10/2023.   -Plan to repeat a lipid panel at next visit.  I am hoping that the diabetes will improve before then  Lela Fendt, MD PhD [facility] Endocrinology

## 2024-01-17 ENCOUNTER — Telehealth: Payer: Self-pay | Admitting: Nutrition

## 2024-01-17 NOTE — Telephone Encounter (Signed)
LVM to call back to schedule pump training appointment

## 2024-01-26 ENCOUNTER — Telehealth: Payer: Self-pay

## 2024-01-26 ENCOUNTER — Other Ambulatory Visit (HOSPITAL_COMMUNITY): Payer: Self-pay

## 2024-01-26 ENCOUNTER — Encounter: Payer: Self-pay | Admitting: Internal Medicine

## 2024-01-26 NOTE — Telephone Encounter (Signed)
Pt needs prior auth for Dexcom G7

## 2024-01-26 NOTE — Telephone Encounter (Signed)
Pharmacy Patient Advocate Encounter   Received notification from Pt Calls Messages that prior authorization for Dexcom G7 sensor is required/requested.   Insurance verification completed.   The patient is insured through [facility] .   Per test claim: PA required; PA submitted to above mentioned insurance via CoverMyMeds Key/confirmation #/EOC [id number] Status is pending

## 2024-01-31 NOTE — Telephone Encounter (Signed)
 Pharmacy Patient Advocate Encounter  Received notification from [facility] that Prior Authorization for Dexcom G7 sensor has been APPROVED through 07/23/24   PA #/Case ID/Reference #: [id number]

## 2024-02-01 ENCOUNTER — Telehealth: Payer: Self-pay | Admitting: Nutrition

## 2024-02-01 NOTE — Telephone Encounter (Signed)
LVM to call me to schedule pump training 

## 2024-03-13 ENCOUNTER — Encounter: Attending: Internal Medicine | Admitting: Nutrition

## 2024-03-13 ENCOUNTER — Ambulatory Visit: Payer: Medicaid Other | Admitting: Internal Medicine

## 2024-03-13 DIAGNOSIS — E1069 Type 1 diabetes mellitus with other specified complication: Secondary | ICD-10-CM | POA: Insufficient documentation

## 2024-03-14 ENCOUNTER — Telehealth: Payer: Self-pay | Admitting: Nutrition

## 2024-03-14 NOTE — Progress Notes (Signed)
 Patient is here with her mother to start the Tandem Control IQ insulin pump.  She did not take her Lantus last night.  Settings were put into the pump by the patient:  Basal rate: 2.4u/hr, ISF: 15, I/C:4 target 110, max bolus 25 max basal 5.0u/hr.  She filled a cartridge and attached the infusion set. To her left abdomen.  Her blood sugar was 421 and a correction dose was given at 5:35PM.  She is wearing a Dexcom G7.  This is linked to [facility], and was linked to her pump.  The tandem pump was linked to source in the office.  She was advised to keep this running at all times, or to at least turn this on 2 days before coming in for her appointment with Dr. Elvera Lennox.   We reviewed how to do a bolus and how and when to do a correction dose.  She re demonstrated this correctly and did a correction bolus without my assistance as above.   She was given a pump packet and we reviewed high blood sugar protocols, sick day guidelines, emergency supplies to carry with her at all times and low blood sugar protocols.  She was given the choice to see me again in one week for review, but refused at this time.   We discussed the difference between sensor glucose and blood sugar readings and the need to test blood when blood sugars are low and again in 30 minutes, rather than waiting for CGM to rise.  She reported good understanding of this and had no final questions.

## 2024-03-14 NOTE — Telephone Encounter (Signed)
 Patient reported no difficulty giving boluses for all meals and snacks.  FBS today was 122.  Blood sugar now 101.  Pt. Very pleased with pump and had no questions for me at this time.

## 2024-03-14 NOTE — Patient Instructions (Signed)
 Change infusion set every 3 days Change sensor using phone and pump every 10 days Call Dexcom help line if having sensor problems Call Tandem help line if having problems Read over pump manual and pump packet given  Call office if blood sugars continue to remain over 250 or fall below 70.   Review low blood sugar protocols and high blood sugar protocols and call if questions

## 2024-03-26 ENCOUNTER — Telehealth: Payer: Self-pay | Admitting: Internal Medicine

## 2024-03-26 NOTE — Telephone Encounter (Signed)
 Pt requesting phone visit for her upcoming appt. Please advise

## 2024-03-28 ENCOUNTER — Ambulatory Visit: Payer: Medicaid Other | Attending: General Practice | Admitting: Internal Medicine

## 2024-03-28 ENCOUNTER — Encounter: Payer: Self-pay | Admitting: Internal Medicine

## 2024-03-28 DIAGNOSIS — I4711 Inappropriate sinus tachycardia, so stated: Secondary | ICD-10-CM | POA: Diagnosis not present

## 2024-03-28 MED ORDER — CARVEDILOL 12.5 MG PO TABS: 12.5000 mg | ORAL_TABLET | Freq: Two times a day (BID) | ORAL | 3 refills | Status: AC
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
## 2024-03-28 NOTE — Patient Instructions (Signed)
 Medication Instructions:  Your physician has recommended you make the following change in your medication:   ** Stop Carvedilol  6.25mg   ** Begin Carvedilol  12.5mg  - 1 tablet by mouth twice daily.  *If you need a refill on your cardiac medications before your next appointment, please call your pharmacy*  Lab Work: None ordered.  If you have labs (blood work) drawn today and your tests are completely normal, you will receive your results only by: MyChart Message (if you have MyChart) OR A paper copy in the mail If you have any lab test that is abnormal or we need to change your treatment, we will call you to review the results.  Testing/Procedures: None ordered.   Follow-Up: At [facility], you and your health needs are our priority.  As part of our continuing mission to provide you with exceptional heart care, our providers are all part of one team.  This team includes your primary Cardiologist (physician) and Advanced Practice Providers or APPs (Physician Assistants and Nurse Practitioners) who all work together to provide you with the care you need, when you need it.  Your next appointment:   3-4 months with Dr Rodolfo Clan      1st Floor: - Lobby - Registration  - Pharmacy  - Lab - Cafe  2nd Floor: - PV Lab - Diagnostic Testing (echo, CT, nuclear med)  3rd Floor: - Vacant  4th Floor: - TCTS (cardiothoracic surgery) - AFib Clinic - Structural Heart Clinic - Vascular Surgery  - Vascular Ultrasound  5th Floor: - HeartCare Cardiology (general and EP) - Clinical Pharmacy for coumadin, hypertension, lipid, weight-loss medications, and med management appointments    Valet parking services will be available as well.

## 2024-04-05 ENCOUNTER — Ambulatory Visit: Admitting: Internal Medicine

## 2024-04-12 ENCOUNTER — Encounter: Payer: Self-pay | Admitting: Internal Medicine

## 2024-04-12 ENCOUNTER — Ambulatory Visit: Admitting: Internal Medicine

## 2024-04-12 VITALS — BP 122/64 | HR 108 | Ht 67.0 in | Wt 235.8 lb

## 2024-04-12 DIAGNOSIS — E785 Hyperlipidemia, unspecified: Secondary | ICD-10-CM

## 2024-04-12 DIAGNOSIS — E663 Overweight: Secondary | ICD-10-CM

## 2024-04-12 DIAGNOSIS — E1065 Type 1 diabetes mellitus with hyperglycemia: Secondary | ICD-10-CM | POA: Diagnosis not present

## 2024-04-12 DIAGNOSIS — E1069 Type 1 diabetes mellitus with other specified complication: Secondary | ICD-10-CM | POA: Diagnosis not present

## 2024-04-12 DIAGNOSIS — E1169 Type 2 diabetes mellitus with other specified complication: Secondary | ICD-10-CM

## 2024-04-12 LAB — POCT GLYCOSYLATED HEMOGLOBIN (HGB A1C): Hemoglobin A1C: 6.7 % — AB (ref 4.0–5.6)

## 2024-04-12 NOTE — Progress Notes (Addendum)
 Patient ID: Veronica Liu, female   DOB: 03-09-1996, 28 y.o.   MRN: [id number] This note was precharted 12/23/2023.  HPI: Veronica Liu is a 28 y.o.-year-old female, returning for follow-up for DM1, dx'ed at 28 y/o as DM2, and DM1 at 28 y/o, on insulin  since 28 y/o uncontrolled, with long-term complications (PN, gastoparesis).  She was previously followed by Dr. Heywood Louder and then Calhoun Catalina with pediatric endocrinology at [facility].  Last visit with 3 months ago. PCP: Dr. Authur Leghorn at [facility].  Interim history: No increased urination, nausea, or blurry vision. She has a reverse circadian rhythm, staying up during the night and waking up around 3-4 PM.  Previously skipping many insulin  doses. Since last visit, however, she was able to start on insulin  pump - 1 mo ago. She is very happy with it!  Reviewed HbA1c levels: 01/12/2024: HbA1c 9.3% Lab Results  Component Value Date   HGBA1C 9.5 (A) 11/01/2023   HGBA1C 10.2 (A) 08/29/2023   HGBA1C 9.7 (A) 11/16/2022   HGBA1C 9.2 (A) 03/19/2022   HGBA1C 8.6 (H) 10/12/2021   HGBA1C 8.0 (A) 07/28/2021   HGBA1C 8.6 (A) 03/17/2021   HGBA1C 8.0 (A) 12/16/2020   HGBA1C 8.1 (A) 09/15/2020   HGBA1C 9.0 (A) 04/01/2020   HGBA1C 9.1 (A) 10/09/2019   HGBA1C 9.0 (A) 06/07/2019   HGBA1C 9.1 (A) 02/01/2019   HGBA1C 9.2 (A) 05/19/2018   HGBA1C 8.7 08/05/2014   HGBA1C 9.9 03/05/2014   HGBA1C 8.0 09/06/2013   HGBA1C 10.8 06/19/2013   HGBA1C 12.5 (H) 06/04/2013   HGBA1C 14.8 (H) 01/17/2013  02/27/2018: HbA1c 9.4% 11/11/2017: HbA1c 8.8% 08/16/2017: HbA1c 8.9% 04/18/2017: HbA1c 9.3% 06/23/2016: HbA1c 12%  Previously on: -Lantus  40 >> 34 units at bedtime >> 17 >> 25 units 2x a day >> Tresiba  40 units daily (not covered) >> Semglee  25 units 2x a day -Humalog  >> Lyumjev : ICR  (not actually using an ICR) - 12-14 units -before her dinner and 6-8 units before the other meals Target 100 ISF 25 >> 20 We tried Metformin  1000 mg with dinner-added  06/2019 >> stopped due to diarrhea.  At last visit she was on: - Semglee              35 >> 40 in am and 20 units in the evening >> 45 >> 50 >> actually 45 units at night (Semglee  not covered) - in thighs - Lyumjev  20-25 units before meals >> 20-26 units before meals (Lyumjev  U200) >> NovoLog  30-35 >> 20-25 units before meals - in thighs - Lyumjev  >> NovoLog  sliding scale: Target 100 ISF 20   Insulin  pump: -T:slim X2 since 03/2024  CGM: - Dexcom G7  Insulin : - Lyumjev  >> Novolog  (likes it better)  Supplies: - Pharmacy for Dexcom - [facility] for pump   Pump settings: - Basal rates: 12 am: 2.4 units/h - Insulin  to carb ratio: 12 am:  1:4 - Target: 12 am:  110 - Correction factor (insulin  sensitivity factor):  12 am: 15 - Active insulin  time: 5h - Changes infusion site: q3 days  TDD: 100-120 units a day Total daily dose from basal insulin : 47% Total daily dose from bolus insulin : 53%   She checks her sugars >4x a day:   Previously:  Previously:   Lowest sugar was 50s x1 in the middle of the night >> ... 65 >> 64 >> 50.  No previous hypoglycemia admissions.  She does have a glucagon  kit at home. Highest sugar was: HI >> 400s >>  300 (bent canula). She had an ED visit for DKA on 10/11/2021.  She also has a [employer].  Pt's meals are: - Breakfast: skips b/c gastroparesis (nausea) - prev. Reglan, now phenergan  - Lunch: sandwich + chips - Dinner: meat + veggies + bread - Snacks: cheese sticks, carrots She cut out red meat, pork, sausage before visit in 10/2023.  No CKD: Lab Results  Component Value Date   BUN 11 08/29/2023   BUN 8 12/09/2022   CREATININE 0.73 08/29/2023   CREATININE 0.63 12/09/2022   Lab Results  Component Value Date   MICRALBCREAT 0.9 08/29/2023   MICRALBCREAT 1.1 11/10/2020   MICRALBCREAT 4 06/07/2019   MICRALBCREAT 10.7 03/05/2014   MICRALBCREAT 20.0 06/03/2013   + HL: Lab Results  Component Value Date   CHOL 304  (H) 08/29/2023   HDL 59.70 08/29/2023   LDLCALC 218 (H) 08/29/2023   TRIG 132.0 08/29/2023   CHOLHDL 5 08/29/2023  She is not on a statin.    - last eye exam was in 05/2020: No DR reportedly.  -+ Occasional numbness and tingling in her feet. Last foot exam 08/29/2023.  Latest TSH was normal: Lab Results  Component Value Date   TSH 1.47 08/29/2023   Pt has FH of DM2 in father and PGM.  Cousin with DM1.  She has a history of depression.  In fall of 2022, she had a panic attack, after which she started to feel very dizzy and feel very poorly.  Her blood pressure started to fluctuate significantly and she also started to have nausea, abdominal pain, anxiety, palpitations, shortness of breath, chest pain, blurry vision, daily migraines, constipation, general muscle aches.  She was investigated by cardiology and had a Holter monitor and also had a 2D echo.  Heart investigation was negative.  She was dx'ed with POTS - sees Dr. Rodolfo Clan. She tried metoprolol  but blood pressure got too low while lying down.  She was smoking half a pack a day >> quit in 10/2021.  ROS: + see HPI  I reviewed pt's medications, allergies, PMH, social hx, family hx, and changes were documented in the history of present illness. Otherwise, unchanged from my initial visit note.  Past Medical History:  Diagnosis Date   Anxiety    Depression    Diabetes mellitus type I (HCC)    onset age 59   Diabetic gastroparesis (HCC)    GERD (gastroesophageal reflux disease)    POTS (postural orthostatic tachycardia syndrome)    Pott's disease    Past Surgical History:  Procedure Laterality Date   NO PAST SURGERIES     Social History   Socioeconomic History   Marital status: Single    Spouse name: Not on file   Number of children: 0  Occupational History   N/a  Tobacco Use   Smoking status: Current Every Day Smoker    Packs/day: 0.50    Years: 5.00    Pack years: 2.50    Types: Cigarettes   Smokeless tobacco:  Never Used   Tobacco comment: NA  Substance and Sexual Activity   Alcohol use: Never    Frequency: Never   Drug use: Never   Sexual activity: Yes    Birth control/protection: None, Condom  Social History Narrative   Lives with mom, step-dad, brother, and sister. Sees bio-dad twice monthly.    Current Outpatient Medications on File Prior to Visit  Medication Sig Dispense Refill   acetone, urine, test strip 1 strip by  Does not apply route as needed for high blood sugar. 25 each 5   BAYER MICROLET LANCETS lancets Check sugars 6 times daily and per protocol for hyper and hypoglycemia 250 each 3   Blood Glucose Monitoring Suppl (ONETOUCH VERIO) w/Device KIT Use to check blood sugar 4 times a day     carvedilol  (COREG ) 12.5 MG tablet Take 1 tablet (12.5 mg total) by mouth 2 (two) times daily with a meal. 180 tablet 3   Continuous Glucose Sensor (DEXCOM G7 SENSOR) MISC 3 each by Does not apply route every 30 (thirty) days. Apply 1 sensor every 10 days 9 each 4   erythromycin  ophthalmic ointment Place a 1/2 inch ribbon of ointment into the left lower eyelid 4 times daily for 7 days. 3.5 g 0   Glucagon  3 MG/DOSE POWD Place 3 mg into the nose once as needed for up to 1 dose. 1 each 11   glucose blood (ONETOUCH VERIO) test strip Checks blood sugar 4 times daily 400 each 11   ibuprofen (ADVIL) 200 MG tablet Take 200 mg by mouth every 6 (six) hours as needed for headache or mild pain.     insulin  aspart (NOVOLOG  FLEXPEN) 100 UNIT/ML FlexPen Inject 20-35 Units into the skin 3 (three) times daily with meals. 30 mL 11   insulin  aspart (NOVOLOG ) 100 UNIT/ML injection Use up to 150 units a day in the insulin  pump as advised 30 mL 11   insulin  glargine (LANTUS  SOLOSTAR) 100 UNIT/ML Solostar Pen Inject 45 Units into the skin at bedtime. 15 mL PRN   Insulin  Pen Needle (BD PEN NEEDLE NANO 2ND GEN) 32G X 4 MM MISC USE TO INJECT INSULIN  VIA INSULIN  PEN FIVE TIMES DAILY 450 each 3   oxyCODONE -acetaminophen   (PERCOCET/ROXICET) 5-325 MG tablet Take 1 tablet by mouth every 6 (six) hours as needed for severe pain. 10 tablet 0   senna-docusate (SENOKOT-S) 8.6-50 MG tablet Take 1 tablet by mouth at bedtime as needed for mild constipation. 20 tablet 0   SYRINGE/NEEDLE, DISP, 1 ML (BD SYRINGE SLIP TIP) 26G X 5/8" 1 ML MISC Use 3-4x a day 100 each 3   No current facility-administered medications on file prior to visit.   Allergies  Allergen Reactions   Levemir [Insulin  Detemir]     Hives at injection sites   Augmentin [Amoxicillin-Pot Clavulanate]     Vomiting as young child   Omnicef [Cefdinir]     Vomiting as young child.   Family History  Problem Relation Age of Onset   Hypothyroidism Mother    Cancer Mother        appendix cancer   Diabetes Father        type 2   Hypothyroidism Paternal Grandmother    Diabetes Paternal Grandmother        type 2   Diabetes Cousin        type 1   PE: BP 122/64   Pulse (!) 108   Ht 5\' 7"  (1.702 m)   Wt 235 lb 12.8 oz (107 kg)   SpO2 99%   BMI 36.93 kg/m   Wt Readings from Last 15 Encounters:  04/12/24 235 lb 12.8 oz (107 kg)  03/28/24 210 lb (95.3 kg)  01/12/24 230 lb 9.6 oz (104.6 kg)  11/09/23 215 lb (97.5 kg)  11/01/23 221 lb 9.6 oz (100.5 kg)  08/29/23 213 lb 6.4 oz (96.8 kg)  07/07/23 210 lb (95.3 kg)  05/16/23 219 lb 6.4 oz (99.5 kg)  12/09/22  205 lb (93 kg)  11/16/22 217 lb 12.8 oz (98.8 kg)  11/04/22 202 lb (91.6 kg)  06/17/22 208 lb 3.2 oz (94.4 kg)  03/24/22 205 lb (93 kg)  03/19/22 200 lb 6.4 oz (90.9 kg)  02/24/22 204 lb (92.5 kg)   Constitutional: overweight, in NAD Eyes:  EOMI, no exophthalmos ENT: no neck masses, no cervical lymphadenopathy Cardiovascular: tachycardia, RR, No MRG Respiratory: CTA B Musculoskeletal: no deformities Skin:no rashes Neurological: + tremor with outstretched hands  ASSESSMENT: 1. DM1, uncontrolled, without long-term complications, but with hyperglycemia  2. HL  PLAN:  1. Patient with  longstanding, uncontrolled, type 1 diabetes, previously on basal/bolus insulin  regimen, with poor control and an HbA1c at last visit of 9.3%, lower but still elevated.  Since last visit, fortunately, she was able to start the t:slim X2 insulin  pump integrated with the Dexcom CGM approximately 1 month ago.  She loves the pump. CGM interpretation: -At today's visit, we reviewed her CGM downloads: It appears that 86% of values are in target range (goal >70%), while 12% are higher than 180 (goal <25%), and 2% are lower than 70 (goal <4%).  The calculated average blood sugar is 128.  The projected HbA1c for the next 3 months (GMI) is 6.4%. -Reviewing the CGM trends, sugars are impressively improved, with the majority of values now at goal.  In the last 4 days, sugars have been higher and more fluctuating, and I suspect that this may be due to being in the premenstrual week, when insulin  resistance is higher.  Due to the significant improvement in sugars, for now, I did not suggest a change in regimen, but I did recommend to change to the control IQ plus-explained the differences.  I also recommended to do extended boluses for fattier meals. - I recommended: Patient Instructions  Please continue: - Basal rates: 12 am: 2.4 units/h - Insulin  to carb ratio: 12 am:  1:4 - Target: 12 am:  110 - Correction factor (insulin  sensitivity factor):  12 am: 15 - Active insulin  time: 5h  Please stop at the lab.  Please return in 3-4 months.  - we checked her HbA1c: 6.7% (LOWEST IN A LONG TIME!) - advised to check sugars at different times of the day - 4x a day, rotating check times - advised for yearly eye exams >> she is not UTD - will check annual labs now - return to clinic in 3-4 months  2. HL -Reviewing the latest lipid panel from 08/2023, her LDL is very high, otherwise fractions are at goal: Lab Results  Component Value Date   CHOL 304 (H) 08/29/2023   HDL 59.70 08/29/2023   LDLCALC 218 (H)  08/29/2023   TRIG 132.0 08/29/2023   CHOLHDL 5 08/29/2023  - He is not on a statin due to weightbearing age.  I previously recommended a change in diet.  She cut down on the red and processed meat before our visit from 10/2023.   - Will recheck her lipid panel now  Component     Latest Ref Rng 04/12/2024  Hemoglobin A1C     4.0 - 5.6 % 6.7 !   Creatinine, Urine     20 - 275 mg/dL 58   Microalb, Ur     mg/dL <1.6   MICROALB/CREAT RATIO     <30 mg/g creat NOTE   Glucose     65 - 99 mg/dL 109 (H)   BUN     7 - 25 mg/dL 13  Creatinine     0.50 - 0.96 mg/dL 8.46   BUN/Creatinine Ratio     6 - 22 (calc) SEE NOTE:   Sodium     135 - 146 mmol/L 139   Potassium     3.5 - 5.3 mmol/L 4.4   Chloride     98 - 110 mmol/L 102   CO2     20 - 32 mmol/L 27   Calcium     8.6 - 10.2 mg/dL 9.9   Total Protein     6.1 - 8.1 g/dL 7.6   Albumin MSPROF     3.6 - 5.1 g/dL 4.4   Globulin     1.9 - 3.7 g/dL (calc) 3.2   AG Ratio     1.0 - 2.5 (calc) 1.4   Total Bilirubin     0.2 - 1.2 mg/dL 1.0   Alkaline phosphatase (APISO)     31 - 125 U/L 86   AST     10 - 30 U/L 20   ALT     6 - 29 U/L 29   TSH     mIU/L 1.68   Cholesterol     <200 mg/dL 962 (H)   HDL Cholesterol     > OR = 50 mg/dL 54   Triglycerides     <150 mg/dL 99   LDL Cholesterol (Calc)     mg/dL (calc) 952 (H)   Total CHOL/HDL Ratio     <5.0 (calc) 4.6   Non-HDL Cholesterol (Calc)     <130 mg/dL (calc) 841 (H)   eGFR     > OR = 60 mL/min/1.17m2 116   Labs are at goal with the exception of a high LDL cholesterol, still improved from last check.  Emilie Harden, MD PhD [facility]

## 2024-04-12 NOTE — Patient Instructions (Signed)
 Please continue: - Basal rates: 12 am: 2.4 units/h - Insulin  to carb ratio: 12 am:  1:4 - Target: 12 am:  110 - Correction factor (insulin  sensitivity factor):  12 am: 15 - Active insulin  time: 5h  Please stop at the lab.  Please return in 3-4 months.

## 2024-04-13 ENCOUNTER — Encounter: Payer: Self-pay | Admitting: Internal Medicine

## 2024-04-13 LAB — COMPREHENSIVE METABOLIC PANEL WITH GFR
AG Ratio: 1.4 (calc) (ref 1.0–2.5)
ALT: 29 U/L (ref 6–29)
AST: 20 U/L (ref 10–30)
Albumin: 4.4 g/dL (ref 3.6–5.1)
Alkaline phosphatase (APISO): 86 U/L (ref 31–125)
BUN: 13 mg/dL (ref 7–25)
CO2: 27 mmol/L (ref 20–32)
Calcium: 9.9 mg/dL (ref 8.6–10.2)
Chloride: 102 mmol/L (ref 98–110)
Creat: 0.73 mg/dL (ref 0.50–0.96)
Globulin: 3.2 g/dL (ref 1.9–3.7)
Glucose, Bld: 113 mg/dL — ABNORMAL HIGH (ref 65–99)
Potassium: 4.4 mmol/L (ref 3.5–5.3)
Sodium: 139 mmol/L (ref 135–146)
Total Bilirubin: 1 mg/dL (ref 0.2–1.2)
Total Protein: 7.6 g/dL (ref 6.1–8.1)
eGFR: 116 mL/min/{1.73_m2} (ref >=60)

## 2024-04-13 LAB — LIPID PANEL W/REFLEX DIRECT LDL
Cholesterol: 251 mg/dL — ABNORMAL HIGH (ref <200)
HDL: 54 mg/dL (ref >=50)
LDL Cholesterol (Calc): 175 mg/dL — ABNORMAL HIGH
Non-HDL Cholesterol (Calc): 197 mg/dL — ABNORMAL HIGH (ref <130)
Total CHOL/HDL Ratio: 4.6 (calc) (ref <5.0)
Triglycerides: 99 mg/dL (ref <150)

## 2024-04-13 LAB — TSH: TSH: 1.68 m[IU]/L

## 2024-04-13 LAB — MICROALBUMIN / CREATININE URINE RATIO
Creatinine, Urine: 58 mg/dL (ref 20–275)
Microalb, Ur: <0.2 mg/dL

## 2024-05-07 ENCOUNTER — Encounter: Payer: Self-pay | Admitting: Internal Medicine

## 2024-05-14 ENCOUNTER — Other Ambulatory Visit: Payer: Self-pay

## 2024-05-14 ENCOUNTER — Ambulatory Visit (HOSPITAL_COMMUNITY)
Admission: RE | Admit: 2024-05-14 | Discharge: 2024-05-14 | Disposition: A | Discharge status: Home / Self Care | Source: Ambulatory Visit | Attending: Osteopathic Medicine | Admitting: Osteopathic Medicine

## 2024-05-14 ENCOUNTER — Emergency Department (HOSPITAL_BASED_OUTPATIENT_CLINIC_OR_DEPARTMENT_OTHER)

## 2024-05-14 ENCOUNTER — Emergency Department (HOSPITAL_BASED_OUTPATIENT_CLINIC_OR_DEPARTMENT_OTHER)
Admission: EM | Admit: 2024-05-14 | Discharge: 2024-05-14 | Disposition: A | Discharge status: Home / Self Care | Attending: Emergency Medicine | Admitting: Emergency Medicine

## 2024-05-14 ENCOUNTER — Encounter (HOSPITAL_COMMUNITY): Payer: Self-pay

## 2024-05-14 VITALS — BP 129/91 | HR 99 | Temp 98.1°F | Resp 18

## 2024-05-14 DIAGNOSIS — Z794 Long term (current) use of insulin: Secondary | ICD-10-CM | POA: Insufficient documentation

## 2024-05-14 DIAGNOSIS — R112 Nausea with vomiting, unspecified: Secondary | ICD-10-CM

## 2024-05-14 DIAGNOSIS — E109 Type 1 diabetes mellitus without complications: Secondary | ICD-10-CM | POA: Insufficient documentation

## 2024-05-14 DIAGNOSIS — R109 Unspecified abdominal pain: Secondary | ICD-10-CM

## 2024-05-14 DIAGNOSIS — R1031 Right lower quadrant pain: Secondary | ICD-10-CM | POA: Diagnosis not present

## 2024-05-14 DIAGNOSIS — R1013 Epigastric pain: Secondary | ICD-10-CM | POA: Insufficient documentation

## 2024-05-14 DIAGNOSIS — R1011 Right upper quadrant pain: Secondary | ICD-10-CM | POA: Insufficient documentation

## 2024-05-14 LAB — URINALYSIS, ROUTINE W REFLEX MICROSCOPIC
Bacteria, UA: NONE SEEN
Bilirubin Urine: NEGATIVE
Glucose, UA: NEGATIVE mg/dL
Ketones, ur: NEGATIVE mg/dL
Nitrite: NEGATIVE
Protein, ur: NEGATIVE mg/dL
Specific Gravity, Urine: 1.012 (ref 1.005–1.030)
pH: 6.5 (ref 5.0–8.0)

## 2024-05-14 LAB — CBC
HCT: 43.3 % (ref 36.0–46.0)
Hemoglobin: 14.9 g/dL (ref 12.0–15.0)
MCH: 30 pg (ref 26.0–34.0)
MCHC: 34.4 g/dL (ref 30.0–36.0)
MCV: 87.3 fL (ref 80.0–100.0)
Platelets: 368 10*3/uL (ref 150–400)
RBC: 4.96 MIL/uL (ref 3.87–5.11)
RDW: 12.9 % (ref 11.5–15.5)
WBC: 9.6 10*3/uL (ref 4.0–10.5)
nRBC: 0 % (ref 0.0–0.2)

## 2024-05-14 LAB — COMPREHENSIVE METABOLIC PANEL WITH GFR
ALT: 25 U/L (ref 0–44)
AST: 20 U/L (ref 15–41)
Albumin: 4.3 g/dL (ref 3.5–5.0)
Alkaline Phosphatase: 88 U/L (ref 38–126)
Anion gap: 14 (ref 5–15)
BUN: 13 mg/dL (ref 6–20)
CO2: 24 mmol/L (ref 22–32)
Calcium: 10 mg/dL (ref 8.9–10.3)
Chloride: 102 mmol/L (ref 98–111)
Creatinine, Ser: 0.87 mg/dL (ref 0.44–1.00)
GFR, Estimated: >60 mL/min (ref >60)
Glucose, Bld: 65 mg/dL — ABNORMAL LOW (ref 70–99)
Potassium: 3.7 mmol/L (ref 3.5–5.1)
Sodium: 140 mmol/L (ref 135–145)
Total Bilirubin: 0.7 mg/dL (ref 0.0–1.2)
Total Protein: 8.1 g/dL (ref 6.5–8.1)

## 2024-05-14 LAB — PREGNANCY, URINE: Preg Test, Ur: NEGATIVE

## 2024-05-14 LAB — LIPASE, BLOOD: Lipase: 16 U/L (ref 11–51)

## 2024-05-14 MED ORDER — IOHEXOL 300 MG/ML  SOLN
100.0000 mL | Freq: Once | INTRAMUSCULAR | Status: AC | PRN
  Administered 2024-05-14: 100 mL via INTRAVENOUS

## 2024-05-14 MED ORDER — ONDANSETRON 4 MG PO TBDP: 4.0000 mg | ORAL_TABLET | Freq: Three times a day (TID) | ORAL | 0 refills | Status: AC | PRN

## 2024-05-14 MED ORDER — KETOROLAC TROMETHAMINE 30 MG/ML IJ SOLN
15.0000 mg | Freq: Once | INTRAMUSCULAR | Status: AC
  Administered 2024-05-14: 15 mg via INTRAVENOUS
  Filled 2024-05-14: qty 1

## 2024-05-14 MED ORDER — ONDANSETRON HCL 4 MG/2ML IJ SOLN
4.0000 mg | Freq: Once | INTRAMUSCULAR | Status: AC
  Administered 2024-05-14: 4 mg via INTRAVENOUS
  Filled 2024-05-14: qty 2

## 2024-05-14 NOTE — ED Triage Notes (Signed)
 Pt c/o mid upper abd pain that radiates to RUQ and back for week but got worse yesterday. Reports worse after eating. Reports nausea but denies vomiting.

## 2024-05-14 NOTE — ED Provider Notes (Signed)
 [place] EMERGENCY DEPARTMENT AT [facility] Provider Note   CSN: [id number] Arrival date & time: 05/14/24  1701     History  Chief Complaint  Patient presents with   Abdominal Pain    Veronica Liu is a 28 y.o. female.   Abdominal Pain Patient is in emergent care.  Abdominal pain.  Began in the upper abdomen but now more diffuse.  More on the right side however.  No dysuria.  Reported low-grade fevers.  Seen in urgent care and sent here for further evaluation.  Patient is diabetic.  States she has had asymptomatic UTIs in the past where they did a culture and found infection.    Past Medical History:  Diagnosis Date   Anxiety    Depression    Diabetes mellitus type I (HCC)    onset age 72   Diabetic gastroparesis (HCC)    GERD (gastroesophageal reflux disease)    POTS (postural orthostatic tachycardia syndrome)    Pott's disease    Past Surgical History:  Procedure Laterality Date   NO PAST SURGERIES      Home Medications Prior to Admission medications   Medication Sig Start Date End Date Taking? Authorizing Provider  ondansetron  (ZOFRAN -ODT) 4 MG disintegrating tablet Take 1 tablet (4 mg total) by mouth every 8 (eight) hours as needed. 05/14/24  Yes Mozell Arias, MD  acetone, urine, test strip 1 strip by Does not apply route as needed for high blood sugar. 01/12/24   Emilie Harden, MD  BAYER MICROLET LANCETS lancets Check sugars 6 times daily and per protocol for hyper and hypoglycemia 03/05/14   Ovidio Blower, MD  Blood Glucose Monitoring Suppl [facility] VERIO) w/Device KIT Use to check blood sugar 4 times a day 10/09/19   Emilie Harden, MD  carvedilol  (COREG ) 12.5 MG tablet Take 1 tablet (12.5 mg total) by mouth 2 (two) times daily with a meal. 03/28/24   Verona Goodwill, MD  Continuous Glucose Sensor (DEXCOM G7 SENSOR) MISC 3 each by Does not apply route every 30 (thirty) days. Apply 1 sensor every 10 days 01/12/24   Emilie Harden, MD   erythromycin  ophthalmic ointment Place a 1/2 inch ribbon of ointment into the left lower eyelid 4 times daily for 7 days. 05/23/23   Long, Joshua G, MD  Glucagon  3 MG/DOSE POWD Place 3 mg into the nose once as needed for up to 1 dose. 01/12/24   Emilie Harden, MD  glucose blood (ONETOUCH VERIO) test strip Checks blood sugar 4 times daily 01/14/20   Emilie Harden, MD  ibuprofen (ADVIL) 200 MG tablet Take 200 mg by mouth every 6 (six) hours as needed for headache or mild pain.    [provider]  insulin  aspart (NOVOLOG  FLEXPEN) 100 UNIT/ML FlexPen Inject 20-35 Units into the skin 3 (three) times daily with meals. 11/01/23   Emilie Harden, MD  insulin  aspart (NOVOLOG ) 100 UNIT/ML injection Use up to 150 units a day in the insulin  pump as advised 11/01/23   Emilie Harden, MD  insulin  glargine (LANTUS  SOLOSTAR) 100 UNIT/ML Solostar Pen Inject 45 Units into the skin at bedtime. 01/12/24   Emilie Harden, MD  Insulin  Pen Needle (BD PEN NEEDLE NANO 2ND GEN) 32G X 4 MM MISC USE TO INJECT INSULIN  VIA INSULIN  PEN FIVE TIMES DAILY 12/27/23   Emilie Harden, MD  oxyCODONE -acetaminophen  (PERCOCET/ROXICET) 5-325 MG tablet Take 1 tablet by mouth every 6 (six) hours as needed for severe pain. 05/23/23   Long, Shereen Dike, MD  senna-docusate (SENOKOT-S) 8.6-50 MG tablet Take 1 tablet by mouth at bedtime as needed for mild constipation. 05/23/23   Long, Joshua G, MD  SYRINGE/NEEDLE, DISP, 1 ML (BD SYRINGE SLIP TIP) 26G X 5/8" 1 ML MISC Use 3-4x a day 08/29/23   Emilie Harden, MD      Allergies    Levemir [insulin  detemir], Augmentin [amoxicillin-pot clavulanate], and Omnicef [cefdinir]    Review of Systems   Review of Systems  Gastrointestinal:  Positive for abdominal pain.    Physical Exam Updated Vital Signs BP (!) 130/98   Pulse 85   Temp 98.4 F (36.9 C) (Oral)   Resp 15   Ht 5\' 6"  (1.676 m)   Wt 102.1 kg   LMP 05/08/2024 (Exact Date)   SpO2 100%   BMI 36.32 kg/m  Physical  Exam Vitals and nursing note reviewed.  HENT:     Head: Normocephalic.  Cardiovascular:     Rate and Rhythm: Normal rate.  Pulmonary:     Breath sounds: Normal breath sounds.  Abdominal:     Tenderness: There is abdominal tenderness.     Comments: Tenderness in epigastric right upper quadrant and right lower quadrant.  No hernia palpated.  Neurological:     Mental Status: She is alert.     ED Results / Procedures / Treatments   Labs (all labs ordered are listed, but only abnormal results are displayed) Labs Reviewed  COMPREHENSIVE METABOLIC PANEL WITH GFR - Abnormal; Notable for the following components:      Result Value   Glucose, Bld 65 (*)    All other components within normal limits  URINALYSIS, ROUTINE W REFLEX MICROSCOPIC - Abnormal; Notable for the following components:   Hgb urine dipstick LARGE (*)    Leukocytes,Ua TRACE (*)    All other components within normal limits  LIPASE, BLOOD  CBC  PREGNANCY, URINE    EKG None  Radiology CT ABDOMEN PELVIS W CONTRAST Addendum Date: 05/14/2024 ADDENDUM REPORT: 05/14/2024 20:02 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   On: 05/14/2024 20:02   Result Date: 05/14/2024 CLINICAL DATA:  Right lower quadrant pain EXAM: CT ABDOMEN AND PELVIS WITH CONTRAST TECHNIQUE: Multidetector CT imaging of the abdomen and pelvis was performed using the standard protocol following bolus administration of intravenous contrast. RADIATION DOSE REDUCTION: This exam was performed according to the departmental dose-optimization program which includes automated exposure control, adjustment of the mA and/or kV according to patient size and/or use of iterative reconstruction technique. CONTRAST:  OMNIPAQUE  IOHEXOL  300 MG/ML  SOLN COMPARISON:  None Available. FINDINGS: Lower chest: No acute abnormality. Hepatobiliary: No focal liver abnormality is seen. No gallstones, gallbladder wall thickening, or  biliary dilatation. Pancreas: Unremarkable. No pancreatic ductal dilatation or surrounding inflammatory changes. Spleen: Normal in size without focal abnormality. Adrenals/Urinary Tract: Adrenal glands are unremarkable. Kidneys are normal, without renal calculi, focal lesion, or hydronephrosis. Bladder is unremarkable. Stomach/Bowel: Stomach is within normal limits. Appendix appears normal. No evidence of bowel wall thickening, distention, or inflammatory changes. Vascular/Lymphatic: No significant vascular findings are present. No enlarged abdominal or pelvic lymph nodes. Reproductive: Uterus and bilateral adnexa are unremarkable. Other: No abdominal wall hernia or abnormality. No abdominopelvic ascites. Musculoskeletal: No acute or significant osseous findings. IMPRESSION: Negative CT appearance of the abdomen and pelvis. Normal appendix. Electronically Signed: By: Esmeralda Hedge M.D. On: 05/14/2024 19:18    Procedures Procedures    Medications Ordered in ED Medications  ketorolac (TORADOL) 30 MG/ML injection 15 mg (15 mg Intravenous Given 05/14/24 1812)  iohexol  (OMNIPAQUE ) 300 MG/ML solution 100 mL (100 mLs Intravenous Contrast Given 05/14/24 1840)  ondansetron  (ZOFRAN ) injection 4 mg (4 mg Intravenous Given 05/14/24 1956)    ED Course/ Medical Decision Making/ A&P                                 Medical Decision Making Amount and/or Complexity of Data Reviewed Labs: ordered. Radiology: ordered.  Risk Prescription drug management.   Patient abdominal pain.  Started more epigastric but now diffuse.  Tenderness on right side.  Differential diagnosis does include causes such as appendicitis and cholecystitis.  However I think the best test at this point is CT scan.  Will also get basic blood work.  Blood work reassuring.  Urinalysis had blood but no infection.  CT scan done reassuring.  Feeling somewhat better.  Do not think we need further workup this time.  Appears stable for discharge  home.  Follow-up with PCP.  Symptomatic treatment.        Final Clinical Impression(s) / ED Diagnoses Final diagnoses:  Epigastric pain    Rx / DC Orders ED Discharge Orders          Ordered    ondansetron  (ZOFRAN -ODT) 4 MG disintegrating tablet  Every 8 hours PRN        05/14/24 2015              Mozell Arias, MD 05/14/24 2019

## 2024-05-14 NOTE — ED Triage Notes (Signed)
 Pt POV reporting RUQ abd pain that worsens after eating, seen at [facility] and advised to come to ED for CT.

## 2024-05-14 NOTE — ED Provider Notes (Signed)
 [facility]    CSN: [id number] Arrival date & time: 05/14/24  1556      History   Chief Complaint Chief Complaint  Patient presents with   Appointment    HPI Veronica Liu is a 28 y.o. female.   Veronica Liu is a 28 y.o. female that presents with abdominal pain that started about a week ago. The pain began gradually, initially localized in the epigastric region and different from her usual acid reflux symptoms. The pain has since worsened and spread to the right side of the abdomen and radiates into the middle of her back. The patient describes the back pain as constant, while the abdominal pain comes and goes, worsening with food intake. Associated symptoms include nausea, which started a week ago and has been constant, though without vomiting. The patient reports a decreased appetite and has not eaten much in the past week. A low-grade fever of 100F was noted yesterday. She denies any urinary symptoms but reports that all of her previous UTIs have been asymptomatic and incidentally found with positive culture. She is currently on her menses that started Tuesday of last week.   The following portions of the patient's history were reviewed and updated as appropriate: allergies, current medications, past family history, past medical history, past social history, past surgical history, and problem list.    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   Tachycardia 10/12/2021   DKA (diabetic ketoacidosis) (HCC) 10/11/2021   Type I diabetes mellitus, uncontrolled 10/09/2019   Anxiety about health 10/19/2018    Hypoglycemia due to type 1 diabetes mellitus (HCC) 10/19/2018   Mood disorder (HCC) 04/28/2018   Cigarette nicotine dependence without complication 04/28/2018   Diabetic gastroparesis (HCC)    Hirsutism 08/05/2014   Non compliance with medical treatment 06/04/2013   Goiter 06/04/2013   Adjustment reaction 06/04/2013   Generalized anxiety disorder 06/04/2013   ADD (attention deficit disorder) 06/04/2013   PTSD (post-traumatic stress disorder) 06/04/2013   Hyperglycemia 06/03/2013   Type 1 diabetes mellitus with other specified complication (HCC) 10/26/2012    Past Surgical History:  Procedure Laterality Date   NO PAST SURGERIES      OB History   No obstetric history on file.      Home Medications    Prior to Admission medications   Medication Sig Start Date End Date Taking? Authorizing Provider  acetone, urine, test strip 1 strip by Does not apply route as needed for high blood sugar. 01/12/24   Emilie Harden, MD  BAYER MICROLET LANCETS lancets Check sugars 6 times daily and per protocol for hyper and hypoglycemia 03/05/14   Ovidio Blower, MD  Blood Glucose Monitoring Suppl [facility] VERIO) w/Device KIT Use to check blood sugar 4 times a day 10/09/19   Emilie Harden, MD  carvedilol  (COREG ) 12.5 MG tablet Take 1 tablet (12.5 mg total) by mouth 2 (two) times daily with a meal. 03/28/24   Verona Goodwill, MD  Continuous Glucose Sensor (DEXCOM G7 SENSOR) MISC 3 each by Does not apply route every 30 (thirty) days. Apply  1 sensor every 10 days 01/12/24   Emilie Harden, MD  erythromycin  ophthalmic ointment Place a 1/2 inch ribbon of ointment into the left lower eyelid 4 times daily for 7 days. 05/23/23   Long, Joshua G, MD  Glucagon  3 MG/DOSE POWD Place 3 mg into the nose once as needed for up to 1 dose. 01/12/24   Emilie Harden, MD  glucose blood (ONETOUCH VERIO) test strip Checks blood sugar 4 times daily 01/14/20   Emilie Harden, MD  ibuprofen (ADVIL) 200 MG tablet Take 200  mg by mouth every 6 (six) hours as needed for headache or mild pain.    [provider]  insulin  aspart (NOVOLOG  FLEXPEN) 100 UNIT/ML FlexPen Inject 20-35 Units into the skin 3 (three) times daily with meals. 11/01/23   Emilie Harden, MD  insulin  aspart (NOVOLOG ) 100 UNIT/ML injection Use up to 150 units a day in the insulin  pump as advised 11/01/23   Emilie Harden, MD  insulin  glargine (LANTUS  SOLOSTAR) 100 UNIT/ML Solostar Pen Inject 45 Units into the skin at bedtime. 01/12/24   Emilie Harden, MD  Insulin  Pen Needle (BD PEN NEEDLE NANO 2ND GEN) 32G X 4 MM MISC USE TO INJECT INSULIN  VIA INSULIN  PEN FIVE TIMES DAILY 12/27/23   Emilie Harden, MD  oxyCODONE -acetaminophen  (PERCOCET/ROXICET) 5-325 MG tablet Take 1 tablet by mouth every 6 (six) hours as needed for severe pain. 05/23/23   Long, Joshua G, MD  senna-docusate (SENOKOT-S) 8.6-50 MG tablet Take 1 tablet by mouth at bedtime as needed for mild constipation. 05/23/23   Long, Shereen Dike, MD  SYRINGE/NEEDLE, DISP, 1 ML (BD SYRINGE SLIP TIP) 26G X 5/8" 1 ML MISC Use 3-4x a day 08/29/23   Emilie Harden, MD    Family History Family History  Problem Relation Age of Onset   Hypothyroidism Mother    Cancer Mother        appendix cancer   Diabetes Father        type 2   Hypothyroidism Paternal Grandmother    Diabetes Paternal Grandmother        type 2   Diabetes Cousin        type 1    Social History Social History   Tobacco Use   Smoking status: Former    Current packs/day: 0.50    Average packs/day: 0.5 packs/day for 5.0 years (2.5 ttl pk-yrs)    Types: Cigarettes   Smokeless tobacco: Never   Tobacco comments:    NA  Vaping Use   Vaping status: Never Used  Substance Use Topics   Alcohol use: Never   Drug use: Never     Allergies   Levemir [insulin  detemir], Augmentin [amoxicillin-pot clavulanate], and Omnicef [cefdinir]   Review of Systems Review of Systems   Physical Exam Triage Vital Signs ED  Triage Vitals  Encounter Vitals Group     BP 05/14/24 1615 (!) 129/91     Systolic BP Percentile --      Diastolic BP Percentile --      Pulse Rate 05/14/24 1615 99     Resp 05/14/24 1615 18     Temp 05/14/24 1615 98.1 F (36.7 C)     Temp Source 05/14/24 1615 Oral     SpO2 05/14/24 1615 99 %     Weight --      Height --      Head Circumference --      Peak Flow --      Pain Score 05/14/24 1614 8  Pain Loc --      Pain Education --      Exclude from Growth Chart --    No data found.  Updated Vital Signs BP (!) 129/91 (BP Location: Left Arm)   Pulse 99   Temp 98.1 F (36.7 C) (Oral)   Resp 18   LMP 05/08/2024 (Exact Date)   SpO2 99%   Visual Acuity Right Eye Distance:   Left Eye Distance:   Bilateral Distance:    Right Eye Near:   Left Eye Near:    Bilateral Near:     Physical Exam Vitals reviewed.  Constitutional:      General: She is awake. She is not in acute distress.    Appearance: Normal appearance. She is well-developed and overweight. She is not ill-appearing, toxic-appearing or diaphoretic.  HENT:     Head: Normocephalic.     Mouth/Throat:     Mouth: Mucous membranes are moist.  Eyes:     Conjunctiva/sclera: Conjunctivae normal.  Cardiovascular:     Rate and Rhythm: Normal rate and regular rhythm.     Heart sounds: Normal heart sounds.  Pulmonary:     Effort: Pulmonary effort is normal.     Breath sounds: Normal breath sounds.  Abdominal:     General: Bowel sounds are normal. There is no distension.     Palpations: Abdomen is soft.     Tenderness: There is abdominal tenderness in the right upper quadrant and right lower quadrant. There is no right CVA tenderness or left CVA tenderness.     Comments: Patient seems more tender to the RUQ on exam although reporting tenderness to the RUQ.   Musculoskeletal:        General: Normal range of motion.  Skin:    General: Skin is warm and dry.  Neurological:     General: No focal deficit present.      Mental Status: She is alert and oriented to person, place, and time.  Psychiatric:        Behavior: Behavior is cooperative.      UC Treatments / Results  Labs (all labs ordered are listed, but only abnormal results are displayed) Labs Reviewed - No data to display  EKG   Radiology No results found.  Procedures Procedures (including critical care time)  Medications Ordered in UC Medications - No data to display  Initial Impression / Assessment and Plan / UC Course  I have reviewed the triage vital signs and the nursing notes.  Pertinent labs & imaging results that were available during my care of the patient were reviewed by me and considered in my medical decision making (see chart for details).     Patient presents with abdominal pain that began gradually one week ago initially localized in the epigastric region and different from her usual acid reflux symptoms. The pain has since worsened and spread to the right side of the abdomen and radiates into the middle of her back. Associated symptoms include nausea for the past week without vomiting, decreased appetite, and a low-grade fever reported yesterday. The patient states the pain is distinct from previous acid reflux symptoms. Exam revealed right lower quadrant tenderness with mild tachycardia but stable blood pressure. Differential diagnosis includes acute appendicitis, acute cholecystitis, and nephrolithiasis. The persistent back pain and initial RUQ discomfort suggest cholecystitis or nephrolithiasis, while the RLQ tenderness and clinical presentation are more consistent with early appendicitis. Given the overlap and progression of symptoms, the patient is being referred to the  Emergency Department for further evaluation, including imaging to confirm diagnosis. Zofran  was offered for nausea but patient declined; she was advised to remain NPO until evaluated in the ER. Her mother, a home health nurse, is present with her and  will be driving her.   The patient presents with symptoms and exam findings that warrant a higher level of care and further evaluation. Given the clinical picture, emergency department (ED) assessment is recommended for advanced diagnostics and management. The patient is currently stable and appropriate for transport via private vehicle. A responsible adult will be driving. The patient is alert, oriented, demonstrates clear mental status, good insight into their illness, and sound judgment in making decisions regarding their care. The risks, rationale, and need for ED evaluation were discussed, and the patient is agreeable to the plan.  Documentation was completed with the aid of voice recognition software. Transcription may contain typographical errors.  Final Clinical Impressions(s) / UC Diagnoses   Final diagnoses:  Right sided abdominal pain  Nausea and vomiting, unspecified vomiting type   Discharge Instructions   None    ED Prescriptions   None    PDMP not reviewed this encounter.   Maryruth Sol, [place] 05/14/24 [phone]

## 2024-05-14 NOTE — ED Notes (Signed)
 Patient is being discharged from the Urgent Care and sent to the Emergency Department via POV with mother. Per Leah Primus, NP, patient is in need of higher level of care due to abdominal pain. Patient is aware and verbalizes understanding of plan of care.  Vitals:   05/14/24 1615  BP: (!) 129/91  Pulse: 99  Resp: 18  Temp: 98.1 F (36.7 C)  SpO2: 99%

## 2024-06-26 ENCOUNTER — Other Ambulatory Visit (HOSPITAL_COMMUNITY): Payer: Self-pay

## 2024-06-26 ENCOUNTER — Telehealth: Payer: Self-pay

## 2024-06-26 NOTE — Telephone Encounter (Signed)
 Pharmacy Patient Advocate Encounter   Received notification from CoverMyMeds that prior authorization for Dexcom G7 sensor is required/requested.   Insurance verification completed.   The patient is insured through [facility] .   Per test claim: Refill too soon. PA is not needed at this time. Medication was filled 06/11/24. Next eligible fill date is 07/04/2024.   Pa on file expires 07/23/24

## 2024-07-23 ENCOUNTER — Ambulatory Visit: Admitting: Internal Medicine

## 2024-08-13 ENCOUNTER — Ambulatory Visit: Admitting: Internal Medicine

## 2024-08-13 ENCOUNTER — Encounter: Payer: Self-pay | Admitting: Internal Medicine

## 2024-08-13 VITALS — BP 120/72 | HR 110 | Ht 63.0 in | Wt 242.2 lb

## 2024-08-13 DIAGNOSIS — Z9641 Presence of insulin pump (external) (internal): Secondary | ICD-10-CM

## 2024-08-13 DIAGNOSIS — E785 Hyperlipidemia, unspecified: Secondary | ICD-10-CM | POA: Diagnosis not present

## 2024-08-13 DIAGNOSIS — Z794 Long term (current) use of insulin: Secondary | ICD-10-CM

## 2024-08-13 DIAGNOSIS — E1065 Type 1 diabetes mellitus with hyperglycemia: Secondary | ICD-10-CM

## 2024-08-13 DIAGNOSIS — E663 Overweight: Secondary | ICD-10-CM

## 2024-08-13 DIAGNOSIS — E1169 Type 2 diabetes mellitus with other specified complication: Secondary | ICD-10-CM

## 2024-08-13 LAB — POCT GLYCOSYLATED HEMOGLOBIN (HGB A1C): Hemoglobin A1C: 5.3 % (ref 4.0–5.6)

## 2024-08-13 NOTE — Patient Instructions (Addendum)
 Please continue: - Basal rates: 12 am: 2.4 >> 2.2 units/h - Insulin  to carb ratio: 12 am:  1:4  >> 1:5 - Target: 12 am:  110 - Correction factor (insulin  sensitivity factor):  12 am: 15 >> 20 - Active insulin  time: 5h  You need an eye exam.  Please return in 4 months.

## 2024-08-13 NOTE — Progress Notes (Signed)
 Patient ID: Veronica Liu, female   DOB: 1996-11-01, 28 y.o.   MRN: [id number] This note was precharted 12/23/2023.  HPI: Veronica Liu is a 28 y.o.-year-old female, returning for follow-up for DM1, dx'ed at 28 y/o as DM2, and DM1 at 28 y/o, on insulin  since 28 y/o uncontrolled, with long-term complications (PN, gastoparesis).  She was previously followed by Dr. Hershal and then [facility] with pediatric endocrinology at [facility].  Last visit with 4 months ago. PCP: Dr. Marsa at [facility].  Interim history: No increased urination, nausea, or blurry vision. She has a reverse circadian rhythm, staying up during the night and waking up around 3-4 PM.  Previously skipping many insulin  doses.  Reviewed HbA1c levels: Lab Results  Component Value Date   HGBA1C 6.7 (A) 04/12/2024   HGBA1C 9.5 (A) 11/01/2023   HGBA1C 10.2 (A) 08/29/2023   HGBA1C 9.7 (A) 11/16/2022   HGBA1C 9.2 (A) 03/19/2022   HGBA1C 8.6 (H) 10/12/2021   HGBA1C 8.0 (A) 07/28/2021   HGBA1C 8.6 (A) 03/17/2021   HGBA1C 8.0 (A) 12/16/2020   HGBA1C 8.1 (A) 09/15/2020   HGBA1C 9.0 (A) 04/01/2020   HGBA1C 9.1 (A) 10/09/2019   HGBA1C 9.0 (A) 06/07/2019   HGBA1C 9.1 (A) 02/01/2019   HGBA1C 9.2 (A) 05/19/2018   HGBA1C 8.7 08/05/2014   HGBA1C 9.9 03/05/2014   HGBA1C 8.0 09/06/2013   HGBA1C 10.8 06/19/2013   HGBA1C 12.5 (H) 06/04/2013  01/12/2024: HbA1c 9.3% 02/27/2018: HbA1c 9.4% 11/11/2017: HbA1c 8.8% 08/16/2017: HbA1c 8.9% 04/18/2017: HbA1c 9.3% 06/23/2016: HbA1c 12%  Previously on: -Lantus  40 >> 34 units at bedtime >> 17 >> 25 units 2x a day >> Tresiba  40 units daily (not covered) >> Semglee  25 units 2x a day -Humalog  >> Lyumjev : ICR  (not actually using an ICR) - 12-14 units -before her dinner and 6-8 units before the other meals Target 100 ISF 25 >> 20 We tried Metformin  1000 mg with dinner-added 06/2019 >> stopped due to diarrhea.  Then on: - Semglee              35 >> 40 in am and 20 units in the  evening >> 45 >> 50 >> actually 45 units at night (Semglee  not covered) - in thighs - Lyumjev  20-25 units before meals >> 20-26 units before meals (Lyumjev  U200) >> NovoLog  30-35 >> 20-25 units before meals - in thighs - Lyumjev  >> NovoLog  sliding scale: Target 100 ISF 20   Insulin  pump: -T:slim X2 since 03/2024  CGM: - Dexcom G7  Insulin : - Lyumjev  >> Novolog  (likes it better)  Supplies: - Pharmacy for Dexcom - [facility] for pump   Pump settings: - Basal rates: 12 am: 2.4 units/h - Insulin  to carb ratio: 12 am:  1:4 - Target: 12 am:  110 - Correction factor (insulin  sensitivity factor):  12 am: 15 - Active insulin  time: 5h - Changes infusion site: q3 days  TDD: 100-120 >> 86-120 units a day Total daily dose from basal insulin : 47% >> 48% (42 units) Total daily dose from bolus insulin : 53% >> 52% (45 units)  She checks her sugars >4x a day:   Previously:  Previously:  Lowest sugar was 64 >> 50.  No previous hypoglycemia admissions.  She does have a glucagon  kit at home. Highest sugar was: HI >> 400s >> 300 (bent canula). She had an ED visit for DKA on 10/11/2021.  She also has a [employer].  Pt's meals are: - Breakfast: skips b/c  gastroparesis (nausea) - prev. Reglan, now phenergan  - Lunch: sandwich + chips - Dinner: meat + veggies + bread - Snacks: cheese sticks, carrots She cut out red meat, pork, sausage before visit in 10/2023.  No CKD: Lab Results  Component Value Date   BUN 13 05/14/2024   BUN 13 04/12/2024   CREATININE 0.87 05/14/2024   CREATININE 0.73 04/12/2024   Lab Results  Component Value Date   MICRALBCREAT NOTE 04/12/2024   MICRALBCREAT 4 06/07/2019   MICRALBCREAT 10.7 03/05/2014   MICRALBCREAT 20.0 06/03/2013   + HL: Lab Results  Component Value Date   CHOL 251 (H) 04/12/2024   HDL 54 04/12/2024   LDLCALC 175 (H) 04/12/2024   TRIG 99 04/12/2024   CHOLHDL 4.6 04/12/2024  She is not on a statin.    - last eye  exam was in 05/2020: No DR reportedly.  -+ Occasional numbness and tingling in her feet. Last foot exam 08/29/2023.  Latest TSH was normal: Lab Results  Component Value Date   TSH 1.68 04/12/2024   Pt has FH of DM2 in father and PGM.  Cousin with DM1.  She has a history of depression.  In fall of 2022, she had a panic attack, after which she started to feel very dizzy and feel very poorly.  Her blood pressure started to fluctuate significantly and she also started to have nausea, abdominal pain, anxiety, palpitations, shortness of breath, chest pain, blurry vision, daily migraines, constipation, general muscle aches.  She was investigated by cardiology and had a Holter monitor and also had a 2D echo.  Ver[facility] investigation was negative.  She was dx'ed with POTS - sees [facility]. She tried metoprolol  but blood pressure got too low while lying down.  She was smoking half a pack a day >> quit in 10/2021.  ROS: + see HPI  I reviewed pt's medications, allergies, PMH, social hx, family hx, and changes were documented in the history of present illness. Otherwise, unchanged from my initial visit note.  Past Medical History:  Diagnosis Date   Anxiety    Depression    Diabetes mellitus type I (HCC)    onset age 89   Diabetic gastroparesis (HCC)    GERD (gastroesophageal reflux disease)    POTS (postural orthostatic tachycardia syndrome)    Pott's disease    Past Surgical History:  Procedure Laterality Date   NO PAST SURGERIES     Social History   Socioeconomic History   Marital status: Single    Spouse name: Not on file   Number of children: 0  Occupational History   N/a  Tobacco Use   Smoking status: Current Every Day Smoker    Packs/day: 0.50    Years: 5.00    Pack years: 2.50    Types: Cigarettes   Smokeless tobacco: Never Used   Tobacco comment: NA  Substance and Sexual Activity   Alcohol use: Never    Frequency: Never   Drug use: Never   Sexual activity: Yes    Birth  control/protection: None, Condom  Social History Narrative   Lives with mom, step-dad, brother, and sister. Sees bio-dad twice monthly.    Current Outpatient Medications on File Prior to Visit  Medication Sig Dispense Refill   acetone, urine, test strip 1 strip by Does not apply route as needed for high blood sugar. 25 each 5   BAYER MICROLET LANCETS lancets Check sugars 6 times daily and per protocol for hyper and hypoglycemia 250 each 3  Blood Glucose Monitoring Suppl (ONETOUCH VERIO) w/Device KIT Use to check blood sugar 4 times a day     carvedilol  (COREG ) 12.5 MG tablet Take 1 tablet (12.5 mg total) by mouth 2 (two) times daily with a meal. 180 tablet 3   Continuous Glucose Sensor (DEXCOM G7 SENSOR) MISC 3 each by Does not apply route every 30 (thirty) days. Apply 1 sensor every 10 days 9 each 4   erythromycin  ophthalmic ointment Place a 1/2 inch ribbon of ointment into the left lower eyelid 4 times daily for 7 days. 3.5 g 0   Glucagon  3 MG/DOSE POWD Place 3 mg into the nose once as needed for up to 1 dose. 1 each 11   glucose blood (ONETOUCH VERIO) test strip Checks blood sugar 4 times daily 400 each 11   ibuprofen (ADVIL) 200 MG tablet Take 200 mg by mouth every 6 (six) hours as needed for headache or mild pain.     insulin  aspart (NOVOLOG  FLEXPEN) 100 UNIT/ML FlexPen Inject 20-35 Units into the skin 3 (three) times daily with meals. 30 mL 11   insulin  aspart (NOVOLOG ) 100 UNIT/ML injection Use up to 150 units a day in the insulin  pump as advised 30 mL 11   insulin  glargine (LANTUS  SOLOSTAR) 100 UNIT/ML Solostar Pen Inject 45 Units into the skin at bedtime. 15 mL PRN   Insulin  Pen Needle (BD PEN NEEDLE NANO 2ND GEN) 32G X 4 MM MISC USE TO INJECT INSULIN  VIA INSULIN  PEN FIVE TIMES DAILY 450 each 3   ondansetron  (ZOFRAN -ODT) 4 MG disintegrating tablet Take 1 tablet (4 mg total) by mouth every 8 (eight) hours as needed. 8 tablet 0   oxyCODONE -acetaminophen  (PERCOCET/ROXICET) 5-325 MG tablet  Take 1 tablet by mouth every 6 (six) hours as needed for severe pain. 10 tablet 0   senna-docusate (SENOKOT-S) 8.6-50 MG tablet Take 1 tablet by mouth at bedtime as needed for mild constipation. 20 tablet 0   SYRINGE/NEEDLE, DISP, 1 ML (BD SYRINGE SLIP TIP) 26G X 5/8 1 ML MISC Use 3-4x a day 100 each 3   No current facility-administered medications on file prior to visit.   Allergies  Allergen Reactions   Levemir [Insulin  Detemir]     Hives at injection sites   Augmentin [Amoxicillin-Pot Clavulanate]     Vomiting as young child   Omnicef [Cefdinir]     Vomiting as young child.   Family History  Problem Relation Age of Onset   Hypothyroidism Mother    Cancer Mother        appendix cancer   Diabetes Father        type 2   Hypothyroidism Paternal Grandmother    Diabetes Paternal Grandmother        type 2   Diabetes Cousin        type 1   PE: BP 120/72   Pulse (!) 110   Ht 5' 3 (1.6 m)   Wt 242 lb 3.2 oz (109.9 kg)   SpO2 99%   BMI 42.90 kg/m   Wt Readings from Last 15 Encounters:  08/13/24 242 lb 3.2 oz (109.9 kg)  05/14/24 225 lb (102.1 kg)  04/12/24 235 lb 12.8 oz (107 kg)  03/28/24 210 lb (95.3 kg)  01/12/24 230 lb 9.6 oz (104.6 kg)  11/09/23 215 lb (97.5 kg)  11/01/23 221 lb 9.6 oz (100.5 kg)  08/29/23 213 lb 6.4 oz (96.8 kg)  07/07/23 210 lb (95.3 kg)  05/16/23 219 lb 6.4 oz (99.5  kg)  12/09/22 205 lb (93 kg)  11/16/22 217 lb 12.8 oz (98.8 kg)  11/04/22 202 lb (91.6 kg)  06/17/22 208 lb 3.2 oz (94.4 kg)  03/24/22 205 lb (93 kg)   Constitutional: overweight, in NAD Eyes:  EOMI, no exophthalmos ENT: no neck masses, no cervical lymphadenopathy Cardiovascular: tachycardia, RR, No MRG Respiratory: CTA B Musculoskeletal: no deformities Skin:no rashes Neurological: + tremor with outstretched hands Diabetic Foot Exam - Simple   Simple Foot Form Diabetic Foot exam was performed with the following findings: Yes 08/13/2024  3:58 PM  Visual Inspection No  deformities, no ulcerations, no other skin breakdown bilaterally: Yes Sensation Testing Intact to touch and monofilament testing bilaterally: Yes Pulse Check Posterior Tibialis and Dorsalis pulse intact bilaterally: Yes Comments    ASSESSMENT: 1. DM1, uncontrolled, without long-term complications, but with hyperglycemia  2. HL  PLAN:  1. Patient with longstanding, previously uncontrolled, type 1 diabetes, on t:slim X2 insulin  pump, started 1 month before last visit, with impressively improved control after starting the pump.  At last visit, the majority of the blood sugars were at goal and the HbA1c was also much better, at 6.7% -best in a long time.  I did not recommend a change in pump settings, but we did discuss about upgrading to the control IQ plus version to be able to do extended boluses for fattier meals. CGM interpretation: -At today's visit, we reviewed her CGM downloads: It appears that 89% of values are in target range (goal >70%), while 4% are higher than 180 (goal <25%), and 7% are lower than 70 (goal <4%).  The calculated average blood sugar is 114.  The projected HbA1c for the next 3 months (GMI) is 6.0%. -Reviewing the CGM trends, sugars appear to be fluctuating within the target range, but she has a faulty sensor and it gives her many inaccurate lows.  The sensor prior to this showed more accurate values, but overall, she feels that approximately 1 sensor a month is giving her errors.  Outside these episodes, she still has some lows particularly after meals, but also throughout the day so I did advise her to decrease her basal rates, relaxed insulin  to carb ratios and correction factors. - I recommended: Patient Instructions  Please continue: - Basal rates: 12 am: 2.4 >> 2.2 units/h - Insulin  to carb ratio: 12 am:  1:4  >> 1:5 - Target: 12 am:  110 - Correction factor (insulin  sensitivity factor):  12 am: 15 >> 20 - Active insulin  time: 5h  You need an eye  exam.  Please return in 4 months.  - we checked her HbA1c: 5.3% (further improved) - advised to check sugars at different times of the day - 4x a day, rotating check times - advised for yearly eye exams >> she is not UTD - return to clinic in 4 months.   2. HL - Latest lipid panel was reviewed from last visit: LDL still above target, otherwise fractions at goal. Lab Results  Component Value Date   CHOL 251 (H) 04/12/2024   HDL 54 04/12/2024   LDLCALC 175 (H) 04/12/2024   TRIG 99 04/12/2024   CHOLHDL 4.6 04/12/2024  - She is not on a statin due to weight bearing age.  I previously recommended a change in diet.  She cut down on the red meat.  Lela Fendt, MD PhD [facility]

## 2024-08-14 ENCOUNTER — Other Ambulatory Visit: Payer: Self-pay | Admitting: Internal Medicine

## 2024-09-21 ENCOUNTER — Telehealth: Payer: Self-pay

## 2024-09-21 ENCOUNTER — Other Ambulatory Visit (HOSPITAL_COMMUNITY): Payer: Self-pay

## 2024-09-21 ENCOUNTER — Encounter: Payer: Self-pay | Admitting: Internal Medicine

## 2024-09-21 NOTE — Telephone Encounter (Signed)
 Pt needs a Prior Auth for Dexcom G7 last PA expired in August.

## 2024-09-22 ENCOUNTER — Telehealth: Payer: Self-pay

## 2024-09-22 NOTE — Telephone Encounter (Signed)
 Pharmacy Patient Advocate Encounter   Received notification from Pt Calls Messages that prior authorization for Dexcom G7 sensor is required/requested.   Insurance verification completed.   The patient is insured through [employer].   Per test claim: PA required; PA submitted to above mentioned insurance via Latent Key/confirmation #/EOC [id number] Status is pending

## 2024-09-26 NOTE — Telephone Encounter (Signed)
 Pharmacy Patient Advocate Encounter  Received notification from [employer] that Prior Authorization for Dexcom G7 sensor has been APPROVED from 09/22/24 to 09/22/2025   PA #/Case ID/Reference #:  [id number]

## 2024-12-01 ENCOUNTER — Other Ambulatory Visit: Payer: Self-pay | Admitting: Internal Medicine

## 2024-12-13 ENCOUNTER — Encounter: Payer: Self-pay | Admitting: Internal Medicine

## 2024-12-13 ENCOUNTER — Ambulatory Visit: Admitting: Internal Medicine

## 2024-12-13 VITALS — BP 120/70 | HR 104 | Ht 63.0 in | Wt 256.0 lb

## 2024-12-13 DIAGNOSIS — E785 Hyperlipidemia, unspecified: Secondary | ICD-10-CM | POA: Diagnosis not present

## 2024-12-13 DIAGNOSIS — E1069 Type 1 diabetes mellitus with other specified complication: Secondary | ICD-10-CM | POA: Diagnosis not present

## 2024-12-13 DIAGNOSIS — E663 Overweight: Secondary | ICD-10-CM

## 2024-12-13 DIAGNOSIS — E1065 Type 1 diabetes mellitus with hyperglycemia: Secondary | ICD-10-CM

## 2024-12-13 DIAGNOSIS — E1169 Type 2 diabetes mellitus with other specified complication: Secondary | ICD-10-CM

## 2024-12-13 LAB — POCT GLYCOSYLATED HEMOGLOBIN (HGB A1C): Hemoglobin A1C: 5.8 % — AB (ref 4.0–5.6)

## 2024-12-13 NOTE — Progress Notes (Signed)
 Patient ID: Veronica Liu, female   DOB: [birth year], 29 y.o.   MRN: [id number]  HPI: Veronica Liu is a 29 y.o.-year-old female, returning for follow-up for DM1, dx'ed at 29 y/o as DM2, and DM1 at 29 y/o, on insulin  since 29 y/o uncontrolled, with long-term complications (PN, gastoparesis).  She was previously followed by Dr. Hershal and then [name] with pediatric endocrinology at [facility].  Last visit with 4 months ago. PCP: Dr. Marsa at [facility].  Interim history: No increased urination, nausea, or blurry vision. She has a reverse circadian rhythm, staying up during the night and waking up around 3-4 PM.  Previously skipping many insulin  doses.  Reviewed HbA1c levels: Lab Results  Component Value Date   HGBA1C 5.3 08/13/2024   HGBA1C 6.7 (A) 04/12/2024   HGBA1C 9.5 (A) 11/01/2023   HGBA1C 10.2 (A) 08/29/2023   HGBA1C 9.7 (A) 11/16/2022   HGBA1C 9.2 (A) 03/19/2022   HGBA1C 8.6 (H) 10/12/2021   HGBA1C 8.0 (A) 07/28/2021   HGBA1C 8.6 (A) 03/17/2021   HGBA1C 8.0 (A) 12/16/2020   HGBA1C 8.1 (A) 09/15/2020   HGBA1C 9.0 (A) 04/01/2020   HGBA1C 9.1 (A) 10/09/2019   HGBA1C 9.0 (A) 06/07/2019   HGBA1C 9.1 (A) 02/01/2019   HGBA1C 9.2 (A) 05/19/2018   HGBA1C 8.7 08/05/2014   HGBA1C 9.9 03/05/2014   HGBA1C 8.0 09/06/2013   HGBA1C 10.8 06/19/2013  01/12/2024: HbA1c 9.3% 02/27/2018: HbA1c 9.4% 11/11/2017: HbA1c 8.8% 08/16/2017: HbA1c 8.9% 04/18/2017: HbA1c 9.3% 06/23/2016: HbA1c 12%  Previously on: -Lantus  40 >> 34 units at bedtime >> 17 >> 25 units 2x a day >> Tresiba  40 units daily (not covered) >> Semglee  25 units 2x a day -Humalog  >> Lyumjev : ICR  (not actually using an ICR) - 12-14 units -before her dinner and 6-8 units before the other meals Target 100 ISF 25 >> 20 We tried Metformin  1000 mg with dinner-added 06/2019 >> stopped due to diarrhea.  Then on: - Semglee              35 >> 40 in am and 20 units in the evening >> 45 >> 50 >> actually 45 units  at night (Semglee  not covered) - in thighs - Lyumjev  20-25 units before meals >> 20-26 units before meals (Lyumjev  U200) >> NovoLog  30-35 >> 20-25 units before meals - in thighs - Lyumjev  >> NovoLog  sliding scale: Target 100 ISF 20   Insulin  pump: -T:slim X2 since 03/2024  CGM: - Dexcom G7  Insulin : - Lyumjev  >> Novolog  (likes it better)  Supplies: - Pharmacy for [employer] for pump   Pump settings:  - Basal rates: 12 am: 2.4 >> 2.2 units/h - Insulin  to carb ratio: 12 am:  1:4  - Target: 12 am:  110 - Correction factor (insulin  sensitivity factor):  12 am: 15 - Active insulin  time: 5h  TDD: 100-120 >> 86-120 units a day Total daily dose from basal insulin : 47% >> 48% (42 units) >> 46% (44 units) Total daily dose from bolus insulin : 53% >> 52% (45 units) >> 54% (52 units)  She checks her sugars >4x a day:  Previously:  Previously:  Previously:  Lowest sugar was 64 >> 50 >> 60s.  No previous hypoglycemia admissions.  She does have a glucagon  kit at home. Highest sugar was: HI >> 400s >> 300 (bent canula) >> 250s. She had an ED visit for DKA on 10/11/2021.  She also has a [employer].  Pt's meals  are: - Breakfast: skips b/c gastroparesis (nausea) - prev. Reglan, now phenergan  - Lunch: sandwich + chips - Dinner: meat + veggies + bread - Snacks: cheese sticks, carrots She cut out red meat, pork, sausage before visit in 10/2023.  No CKD: Lab Results  Component Value Date   BUN 13 05/14/2024   BUN 13 04/12/2024   CREATININE 0.87 05/14/2024   CREATININE 0.73 04/12/2024   Lab Results  Component Value Date   MICRALBCREAT NOTE 04/12/2024   MICRALBCREAT 4 06/07/2019   MICRALBCREAT 10.7 03/05/2014   MICRALBCREAT 20.0 06/03/2013   + HL: Lab Results  Component Value Date   CHOL 251 (H) 04/12/2024   HDL 54 04/12/2024   LDLCALC 175 (H) 04/12/2024   TRIG 99 04/12/2024   CHOLHDL 4.6 04/12/2024  She is not on a statin.    - last eye exam  was in 05/2020: No DR reportedly.  -+ Occasional numbness and tingling in her feet. Last foot exam 08/13/2024.  Latest TSH was normal: Lab Results  Component Value Date   TSH 1.68 04/12/2024   Pt has FH of DM2 in father and PGM.  Cousin with DM1.  She has a history of depression.  In fall of 2022, she had a panic attack, after which she started to feel very dizzy and feel very poorly.  Her blood pressure started to fluctuate significantly and she also started to have nausea, abdominal pain, anxiety, palpitations, shortness of breath, chest pain, blurry vision, daily migraines, constipation, general muscle aches.  She was investigated by cardiology and had a Holter monitor and also had a 2D echo.  Heart investigation was negative.  She was dx'ed with POTS - sees Dr. Fernande. She tried metoprolol  but blood pressure got too low while lying down.  She was smoking half a pack a day >> quit in 10/2021.  ROS: + see HPI  I reviewed pt's medications, allergies, PMH, social hx, family hx, and changes were documented in the history of present illness. Otherwise, unchanged from my initial visit note.  Past Medical History:  Diagnosis Date   Anxiety    Depression    Diabetes mellitus type I (HCC)    onset age 8   Diabetic gastroparesis (HCC)    GERD (gastroesophageal reflux disease)    POTS (postural orthostatic tachycardia syndrome)    Pott's disease    Past Surgical History:  Procedure Laterality Date   NO PAST SURGERIES     Social History   Socioeconomic History   Marital status: Single    Spouse name: Not on file   Number of children: 0  Occupational History   N/a  Tobacco Use   Smoking status: Current Every Day Smoker    Packs/day: 0.50    Years: 5.00    Pack years: 2.50    Types: Cigarettes   Smokeless tobacco: Never Used   Tobacco comment: NA  Substance and Sexual Activity   Alcohol use: Never    Frequency: Never   Drug use: Never   Sexual activity: Yes    Birth  control/protection: None, Condom  Social History Narrative   Lives with mom, step-dad, brother, and sister. Sees bio-dad twice monthly.    Current Outpatient Medications on File Prior to Visit  Medication Sig Dispense Refill   acetone, urine, test strip 1 strip by Does not apply route as needed for high blood sugar. 25 each 5   BAYER MICROLET LANCETS lancets Check sugars 6 times daily and per protocol for hyper  and hypoglycemia 250 each 3   Blood Glucose Monitoring Suppl (ONETOUCH VERIO) w/Device KIT Use to check blood sugar 4 times a day     carvedilol  (COREG ) 12.5 MG tablet Take 1 tablet (12.5 mg total) by mouth 2 (two) times daily with a meal. 180 tablet 3   Continuous Glucose Sensor (DEXCOM G7 SENSOR) MISC 3 each by Does not apply route every 30 (thirty) days. Apply 1 sensor every 10 days 9 each 4   erythromycin  ophthalmic ointment Place a 1/2 inch ribbon of ointment into the left lower eyelid 4 times daily for 7 days. 3.5 g 0   Glucagon  3 MG/DOSE POWD Place 3 mg into the nose once as needed for up to 1 dose. 1 each 11   glucose blood (ONETOUCH VERIO) test strip Checks blood sugar 4 times daily 400 each 11   ibuprofen (ADVIL) 200 MG tablet Take 200 mg by mouth every 6 (six) hours as needed for headache or mild pain.     insulin  aspart (NOVOLOG  FLEXPEN) 100 UNIT/ML FlexPen Inject 20-35 Units into the skin 3 (three) times daily with meals. 30 mL 11   insulin  aspart (NOVOLOG ) 100 UNIT/ML injection USE UP TO 150 UNITS PER DAY IN INSULIN  PUMP AS ADVISED 30 mL 11   insulin  glargine (LANTUS  SOLOSTAR) 100 UNIT/ML Solostar Pen Inject 45 Units into the skin at bedtime. 15 mL PRN   Insulin  Pen Needle (BD PEN NEEDLE NANO 2ND GEN) 32G X 4 MM MISC USE TO INJECT INSULIN  VIA INSULIN  PEN FIVE TIMES DAILY 450 each 3   ondansetron  (ZOFRAN -ODT) 4 MG disintegrating tablet Take 1 tablet (4 mg total) by mouth every 8 (eight) hours as needed. 8 tablet 0   oxyCODONE -acetaminophen  (PERCOCET/ROXICET) 5-325 MG tablet  Take 1 tablet by mouth every 6 (six) hours as needed for severe pain. 10 tablet 0   senna-docusate (SENOKOT-S) 8.6-50 MG tablet Take 1 tablet by mouth at bedtime as needed for mild constipation. 20 tablet 0   SYRINGE/NEEDLE, DISP, 1 ML (BD SYRINGE SLIP TIP) 26G X 5/8 1 ML MISC Use 3-4x a day 100 each 3   No current facility-administered medications on file prior to visit.   Allergies  Allergen Reactions   Levemir [Insulin  Detemir]     Hives at injection sites   Augmentin [Amoxicillin-Pot Clavulanate]     Vomiting as young child   Omnicef [Cefdinir]     Vomiting as young child.   Family History  Problem Relation Age of Onset   Hypothyroidism Mother    Cancer Mother        appendix cancer   Diabetes Father        type 2   Hypothyroidism Paternal Grandmother    Diabetes Paternal Grandmother        type 2   Diabetes Cousin        type 1   PE: BP 120/70   Pulse (!) 104   Ht 5' 3 (1.6 m)   Wt 256 lb (116.1 kg)   SpO2 99%   BMI 45.35 kg/m   Wt Readings from Last 15 Encounters:  12/13/24 256 lb (116.1 kg)  08/13/24 242 lb 3.2 oz (109.9 kg)  05/14/24 225 lb (102.1 kg)  04/12/24 235 lb 12.8 oz (107 kg)  03/28/24 210 lb (95.3 kg)  01/12/24 230 lb 9.6 oz (104.6 kg)  11/09/23 215 lb (97.5 kg)  11/01/23 221 lb 9.6 oz (100.5 kg)  08/29/23 213 lb 6.4 oz (96.8 kg)  07/07/23 210  lb (95.3 kg)  05/16/23 219 lb 6.4 oz (99.5 kg)  12/09/22 205 lb (93 kg)  11/16/22 217 lb 12.8 oz (98.8 kg)  11/04/22 202 lb (91.6 kg)  06/17/22 208 lb 3.2 oz (94.4 kg)   Constitutional: overweight, in NAD Eyes:  EOMI, no exophthalmos ENT: no neck masses, no cervical lymphadenopathy Cardiovascular: tachycardia, RR, No MRG Respiratory: CTA B Musculoskeletal: no deformities Skin:no rashes Neurological: + tremor with outstretched hands  ASSESSMENT: 1. DM1, uncontrolled, without long-term complications, but with hyperglycemia  2. HL  PLAN:  1. Patient with longstanding, previously uncontrolled  type 1 diabetes, with impressively improved blood sugars after starting on the t:slim X2 insulin  pump integrated with a Dexcom CGM.  At last visit, HbA1c was 5.3%, decreased from 6.7%.  Reviewing the pump settings at that time, sugars appears to be fluctuating within the target range but she had a faulty sensor and it was giving her many inaccurate logs.  The sensor prior to the last showed more accurate values, but overall, she felt that approximately 1 sensor was giving her error per month.  Outside these episodes, she still had some lows particularly after meals but also throughout the day so I advised her to decrease her basal rates, relax her insulin  to carb ratios and correction factors. CGM interpretation: -At today's visit, we reviewed her CGM downloads: It appears that 90% of values are in target range (goal >70%), while 8% are higher than 180 (goal <25%), and 2% are lower than 70 (goal <4%).  The calculated average blood sugar is 126.  The projected HbA1c for the next 3 months (GMI) is 6.3%. -Reviewing the CGM trends, sugars appear to be well-controlled, fluctuating within the target range, with a slight increase in blood sugars particularly after dinner, but still almost entirely under the upper limit of the target range.  She does not have as many lows as previously.  She does mention that she noticed lower blood sugars after entering the pump setting changes from last visit, which is surprising, since I relaxed her insulin  to carb ratio and the sensitivity factor.  However, as of now, sugars are well-controlled so I did not suggest a change in the regimen. - I recommended: Patient Instructions  Please continue: - Basal rates: 12 am: 2.2 units/h - Insulin  to carb ratio: 12 am:  1:4 - Target: 12 am:  110 - Correction factor (insulin  sensitivity factor):  12 am: 15 - Active insulin  time: 5h  You need an eye exam.  Please return in 4 months.  - we checked her HbA1c: 5.8% (slightly  higher) - advised to check sugars at different times of the day - 4x a day, rotating check times - advised for yearly eye exams >> she is not UTD.  She mentions she needs a referral.  I placed 1 today.  She also mentions floaters in the right eye. - return to clinic in 4 months  2. HL - Latest lipid panel was reviewed from 04/2024: Very high LDL, otherwise fractions at goal: Lab Results  Component Value Date   CHOL 251 (H) 04/12/2024   HDL 54 04/12/2024   LDLCALC 175 (H) 04/12/2024   TRIG 99 04/12/2024   CHOLHDL 4.6 04/12/2024  - She is not on statin due to weight bearing age.  I previously recommended a change in diet.  She cut down on the red meat. - Plan to repeat her lipid panel at next visit  Lela Fendt, MD PhD [facility] Endocrinology

## 2024-12-13 NOTE — Patient Instructions (Addendum)
 Please continue: - Basal rates: 12 am: 2.2 units/h - Insulin  to carb ratio: 12 am:  1:4 - Target: 12 am:  110 - Correction factor (insulin  sensitivity factor):  12 am: 15 - Active insulin  time: 5h  You need an eye exam.  Please return in 4 months.

## 2025-04-16 ENCOUNTER — Ambulatory Visit: Admitting: Internal Medicine
